# Patient Record
Sex: Male | Born: 1965 | Race: Black or African American | Hispanic: No | Marital: Married | Smoking: Never smoker
Health system: Southern US, Community
[De-identification: ages and names within clinical notes are randomized; demographics above are authoritative.]

## PROBLEM LIST (undated history)

## (undated) DIAGNOSIS — E785 Hyperlipidemia, unspecified: Secondary | ICD-10-CM

## (undated) DIAGNOSIS — I499 Cardiac arrhythmia, unspecified: Secondary | ICD-10-CM

## (undated) DIAGNOSIS — G4730 Sleep apnea, unspecified: Secondary | ICD-10-CM

## (undated) DIAGNOSIS — M109 Gout, unspecified: Secondary | ICD-10-CM

## (undated) DIAGNOSIS — I4891 Unspecified atrial fibrillation: Secondary | ICD-10-CM

## (undated) DIAGNOSIS — I10 Essential (primary) hypertension: Secondary | ICD-10-CM

## (undated) DIAGNOSIS — N400 Enlarged prostate without lower urinary tract symptoms: Secondary | ICD-10-CM

## (undated) DIAGNOSIS — I429 Cardiomyopathy, unspecified: Secondary | ICD-10-CM

## (undated) DIAGNOSIS — R011 Cardiac murmur, unspecified: Secondary | ICD-10-CM

## (undated) HISTORY — PX: COLONOSCOPY: SHX174

## (undated) HISTORY — DX: Sleep apnea, unspecified: G47.30

## (undated) HISTORY — DX: Cardiac murmur, unspecified: R01.1

## (undated) HISTORY — DX: Hyperlipidemia, unspecified: E78.5

---

## 2005-11-07 ENCOUNTER — Emergency Department (HOSPITAL_COMMUNITY): Admission: EM | Admit: 2005-11-07 | Discharge: 2005-11-08 | Payer: Self-pay | Admitting: Emergency Medicine

## 2005-11-10 ENCOUNTER — Emergency Department (HOSPITAL_COMMUNITY): Admission: EM | Admit: 2005-11-10 | Discharge: 2005-11-10 | Payer: Self-pay | Admitting: Family Medicine

## 2005-11-13 ENCOUNTER — Emergency Department (HOSPITAL_COMMUNITY): Admission: EM | Admit: 2005-11-13 | Discharge: 2005-11-13 | Payer: Self-pay | Admitting: Family Medicine

## 2006-06-26 ENCOUNTER — Encounter: Admission: RE | Admit: 2006-06-26 | Discharge: 2006-06-26 | Payer: Self-pay | Admitting: Family Medicine

## 2007-12-09 ENCOUNTER — Emergency Department (HOSPITAL_COMMUNITY): Admission: EM | Admit: 2007-12-09 | Discharge: 2007-12-09 | Payer: Self-pay | Admitting: Emergency Medicine

## 2009-12-01 ENCOUNTER — Emergency Department (HOSPITAL_COMMUNITY): Admission: EM | Admit: 2009-12-01 | Discharge: 2009-12-01 | Payer: Self-pay | Admitting: Emergency Medicine

## 2010-08-12 ENCOUNTER — Inpatient Hospital Stay (HOSPITAL_COMMUNITY)
Admission: EM | Admit: 2010-08-12 | Discharge: 2010-08-14 | Disposition: A | Discharge status: Still In Hospital | Payer: Self-pay | Source: Home / Self Care

## 2010-09-12 NOTE — H&P (Signed)
NAMEJET, ARMBRUST                 ACCOUNT NO.:  [id number]  MEDICAL RECORD NO.:  [id number]          PATIENT TYPE:  EMS  LOCATION:  [facility]                         FACILITY:  [facility]  PHYSICIAN:  Massie Maroon, MD        DATE OF BIRTH:  04-17-1966  DATE OF ADMISSION:  08/12/2010 DATE OF DISCHARGE:                             HISTORY & PHYSICAL   CHIEF COMPLAINT:  I have got a stomachache.  HISTORY OF PRESENT ILLNESS:  A 45 year old male with a history of hypertension, gout presents with complaints of stomachache, nausea, vomiting, diarrhea.  He apparently has had this in approximately every Thursdays for the last few months and complains that this is related to his allopurinol.  He presented today with similar complaint and was found to be in possible mild acute renal insufficiency.  CT scan showed severe thickening of the walls of multiple loops of small bowel that is seen in the jejunum consistent with an infectious or inflammatory enteritis rather than ischemia.  Fatty infiltration of the liver.  CT scan shows small bowel obstruction.  Patient will be admitted for abdominal pain, nausea, vomiting, diarrhea, and enteritis/small-bowel obstruction.  PAST MEDICAL HISTORY: 1. Hypertension. 2. Gout.  PAST SURGICAL HISTORY:  None.  SOCIAL HISTORY:  The patient does not smoke, but he does drink occasionally.  He likes to drink liquor.  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  He has service connected to 50% for hypertension.  FAMILY HISTORY:  Positive for hypertension.  ALLERGIES:  No known drug allergies.  MEDICATIONS:  Allopurinol 100 mg p.o. daily, hydrochlorothiazide 25 mg p.o. daily, terazosin 5 mg p.o. daily.  PHYSICAL EXAMINATION:  VITAL SIGNS: Temperature 97.4, pulse 114, repeat 97, blood pressure is 116/84, pulse ox 97% on room air.  HEENT: Anicteric, EOMI, no nystagmus, pupils 1.5 mm, symmetric, direct consensual reflexes intact.  Mucous  membranes moist.  NECK: No JVD, no bruit, no thyromegaly, no adenopathy.  HEART: Regular rate and rhythm. S1, S2.  No murmurs, gallops or rubs.  LUNGS: Clear to auscultation bilaterally.  ABDOMEN: Soft, slightly distended, nontender, positive bowel sounds.  No hepatosplenomegaly.  EXTREMITIES: No cyanosis, clubbing, or edema.  SKIN: No rashes.  LYMPH NODES: No adenopathy. NEURO: Nonfocal.  LABORATORY DATA:  WBC is 15.4, hemoglobin 18.4, platelet count 239. Sodium 136, potassium 3.3, BUN 15, creatinine 2.14, lactic acid 1.8, AST 23, ALT 20, alk phos 51, total bilirubin 1.2, lipase 24.  CT of the abdomen and pelvis shows severe thickening of multiple loops of small bowel that is seen in jejunum consistent with infectious or inflammatory arthritis rather than ischemia associated with small amount of scattered small ascites noted.  There is small bowel obstruction. Fatty infiltration of liver.  ASSESSMENT/PLAN:  Nausea, vomiting, diarrhea:  Secondary to small-bowel obstruction secondary to enteritis:  The patient will have stool studies for fecal leukocytes, culture C diff, ova and parasites.  We will check a TSH to rule out this as a cause of his diarrhea.  We will consult surgery for small-bowel obstruction.  The patient remained n.p.o. for  now and will observe with an abdominal flat and upright in the a.m.  The patient can have Zofran or Phenergan for his nausea and vomiting.  We will check a set of cardiac markers in case his nausea and vomiting is of cardiac etiology.  We will stop his allopurinol due to renal insufficiency and patient can be started on Uloric 40 mg p.o. daily.  He may be better served by using a different type of blood pressure medicine other than hydrochlorothiazide in light of his history of gout. We will switch him over to our carvedilol 6.25 mg p.o. b.i.d. and discontinue hydrochlorothiazide.  For DVT prophylaxis, will use Lovenox 40 mg subcu  daily.     Massie Maroon, MD     JYK/MEDQ  D:  08/13/2010  T:  08/13/2010  Job:  [id number]  Electronically Signed by Pearson Grippe MD on 09/12/2010 08:39:03 PM

## 2010-10-27 LAB — DIFFERENTIAL
Basophils Absolute: 0 10*3/uL (ref 0.0–0.1)
Basophils Absolute: 0 10*3/uL (ref 0.0–0.1)
Basophils Relative: 0 % (ref 0–1)
Basophils Relative: 0 % (ref 0–1)
Eosinophils Absolute: 0.1 10*3/uL (ref 0.0–0.7)
Eosinophils Relative: 1 % (ref 0–5)
Lymphocytes Relative: 35 % (ref 12–46)
Monocytes Absolute: 0.6 10*3/uL (ref 0.1–1.0)
Monocytes Absolute: 0.7 10*3/uL (ref 0.1–1.0)
Monocytes Relative: 4 % (ref 3–12)
Neutrophils Relative %: 82 % — ABNORMAL HIGH (ref 43–77)
Neutrophils Relative %: 86 % — ABNORMAL HIGH (ref 43–77)

## 2010-10-27 LAB — TROPONIN I: Troponin I: 0.04 ng/mL (ref 0.00–0.06)

## 2010-10-27 LAB — STOOL CULTURE

## 2010-10-27 LAB — HEPATIC FUNCTION PANEL
ALT: 20 U/L (ref 0–53)
Alkaline Phosphatase: 51 U/L (ref 39–117)
Total Protein: 6.7 g/dL (ref 6.0–8.3)

## 2010-10-27 LAB — LACTIC ACID, PLASMA: Lactic Acid, Venous: 1.8 mmol/L (ref 0.5–2.2)

## 2010-10-27 LAB — CBC
HCT: 42.2 % (ref 39.0–52.0)
HCT: 52.6 % — ABNORMAL HIGH (ref 39.0–52.0)
MCH: 30.5 pg (ref 26.0–34.0)
MCH: 30.7 pg (ref 26.0–34.0)
MCH: 31 pg (ref 26.0–34.0)
MCHC: 33.6 g/dL (ref 30.0–36.0)
MCV: 90.8 fL (ref 78.0–100.0)
Platelets: 187 10*3/uL (ref 150–400)
Platelets: 225 10*3/uL (ref 150–400)
Platelets: 239 10*3/uL (ref 150–400)
RDW: 13.9 % (ref 11.5–15.5)
RDW: 14.2 % (ref 11.5–15.5)
WBC: 15.4 10*3/uL — ABNORMAL HIGH (ref 4.0–10.5)
WBC: 6.7 10*3/uL (ref 4.0–10.5)

## 2010-10-27 LAB — BASIC METABOLIC PANEL
BUN: 16 mg/dL (ref 6–23)
CO2: 25 mEq/L (ref 19–32)
Calcium: 9.9 mg/dL (ref 8.4–10.5)
Chloride: 102 mEq/L (ref 96–112)
Creatinine, Ser: 1.71 mg/dL — ABNORMAL HIGH (ref 0.4–1.5)
GFR calc non Af Amer: 33 mL/min — ABNORMAL LOW (ref >60)
Glucose, Bld: 149 mg/dL — ABNORMAL HIGH (ref 70–99)
Glucose, Bld: 85 mg/dL (ref 70–99)
Sodium: 136 mEq/L (ref 135–145)

## 2010-10-27 LAB — COMPREHENSIVE METABOLIC PANEL
ALT: 19 U/L (ref 0–53)
Alkaline Phosphatase: 49 U/L (ref 39–117)
BUN: 16 mg/dL (ref 6–23)
GFR calc Af Amer: 45 mL/min — ABNORMAL LOW (ref >60)
GFR calc non Af Amer: 37 mL/min — ABNORMAL LOW (ref >60)
Glucose, Bld: 126 mg/dL — ABNORMAL HIGH (ref 70–99)
Potassium: 3.8 mEq/L (ref 3.5–5.1)
Sodium: 137 mEq/L (ref 135–145)

## 2010-10-27 LAB — GIARDIA/CRYPTOSPORIDIUM SCREEN(EIA)

## 2010-10-27 LAB — RAPID URINE DRUG SCREEN, HOSP PERFORMED
Amphetamines: NOT DETECTED
Cocaine: NOT DETECTED
Tetrahydrocannabinol: NOT DETECTED

## 2010-10-27 LAB — CK TOTAL AND CKMB (NOT AT ARMC)
CK, MB: 3.8 ng/mL (ref 0.3–4.0)
Relative Index: 2.1 (ref 0.0–2.5)
Total CK: 178 U/L (ref 7–232)

## 2010-10-27 LAB — FECAL LACTOFERRIN, QUANT

## 2010-10-27 LAB — MAGNESIUM: Magnesium: 1.8 mg/dL (ref 1.5–2.5)

## 2011-08-18 HISTORY — PX: CARDIAC DEFIBRILLATOR PLACEMENT: SHX171

## 2011-12-04 ENCOUNTER — Encounter (HOSPITAL_COMMUNITY): Payer: Self-pay | Admitting: Emergency Medicine

## 2011-12-04 ENCOUNTER — Emergency Department (HOSPITAL_COMMUNITY)
Admission: EM | Admit: 2011-12-04 | Discharge: 2011-12-04 | Disposition: A | Discharge status: Home / Self Care | Payer: Non-veteran care | Attending: Emergency Medicine | Admitting: Emergency Medicine

## 2011-12-04 DIAGNOSIS — I10 Essential (primary) hypertension: Secondary | ICD-10-CM | POA: Insufficient documentation

## 2011-12-04 DIAGNOSIS — Z79899 Other long term (current) drug therapy: Secondary | ICD-10-CM | POA: Insufficient documentation

## 2011-12-04 DIAGNOSIS — S51809A Unspecified open wound of unspecified forearm, initial encounter: Secondary | ICD-10-CM | POA: Insufficient documentation

## 2011-12-04 DIAGNOSIS — S51811A Laceration without foreign body of right forearm, initial encounter: Secondary | ICD-10-CM

## 2011-12-04 DIAGNOSIS — M79609 Pain in unspecified limb: Secondary | ICD-10-CM | POA: Insufficient documentation

## 2011-12-04 HISTORY — DX: Essential (primary) hypertension: I10

## 2011-12-04 MED ORDER — TETANUS-DIPHTH-ACELL PERTUSSIS 5-2.5-18.5 LF-MCG/0.5 IM SUSP
0.5000 mL | Freq: Once | INTRAMUSCULAR | Status: AC
  Administered 2011-12-04: 0.5 mL via INTRAMUSCULAR
  Filled 2011-12-04: qty 0.5

## 2011-12-04 MED ORDER — IBUPROFEN 800 MG PO TABS: 800.0000 mg | ORAL_TABLET | Freq: Three times a day (TID) | ORAL | Status: AC | PRN

## 2011-12-04 NOTE — Discharge Instructions (Signed)
Have the sutures out in 10 days. Return here as needed for any worsening in your condition. Keep wound clean and dry.

## 2011-12-04 NOTE — ED Notes (Signed)
Dressing applied to laceration after sutures were completed. Pt. Tolerated without difficulty.  Sterile dressing applied with non-adherant pad with kerlix
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
## 2011-12-04 NOTE — ED Provider Notes (Signed)
Medical screening examination/treatment/procedure(s) were performed by non-physician practitioner and as supervising physician I was immediately available for consultation/collaboration.  Gerhard Munch, MD 12/04/11 [phone]
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
## 2011-12-15 ENCOUNTER — Encounter (HOSPITAL_COMMUNITY): Payer: Self-pay | Admitting: Emergency Medicine

## 2011-12-15 ENCOUNTER — Emergency Department (INDEPENDENT_AMBULATORY_CARE_PROVIDER_SITE_OTHER)
Admission: EM | Admit: 2011-12-15 | Discharge: 2011-12-15 | Disposition: A | Discharge status: Home Health | Payer: Non-veteran care | Source: Home / Self Care | Attending: Family Medicine | Admitting: Family Medicine

## 2011-12-15 DIAGNOSIS — Z4802 Encounter for removal of sutures: Secondary | ICD-10-CM

## 2011-12-15 DIAGNOSIS — IMO0002 Reserved for concepts with insufficient information to code with codable children: Secondary | ICD-10-CM

## 2011-12-15 NOTE — ED Notes (Signed)
Here for suture removal to right lat forearm s/p laceration 12/04/11.incision well approx,intact without drainage but minimal tenderness.17 stitches intact.denies pain or numb/tingling

## 2011-12-15 NOTE — Discharge Instructions (Signed)
Follow handout instructions for wound care. Return if redness swelling, pain or drainage.

## 2011-12-17 NOTE — ED Provider Notes (Signed)
History     CSN: [id number]  Arrival date & time 12/15/11  1619   First MD Initiated Contact with Patient 12/15/11 1721      Chief Complaint  Patient presents with  . Suture / Staple Removal    (Consider location/radiation/quality/duration/timing/severity/associated sxs/prior treatment) Patient is a 46 y.o. male presenting with suture removal. The history is provided by the patient. No language interpreter was used.  Suture / Staple Removal  The sutures were placed 7 to 10 days ago. Treatments since wound repair include regular soap and water washings. Fever duration: no fever. There has been no drainage from the wound. There is no redness present. There is no swelling present. The pain has improved. He has no difficulty moving the affected extremity or digit.    Past Medical History  Diagnosis Date  . Hypertension     History reviewed. No pertinent past surgical history.  No family history on file.  History  Substance Use Topics  . Smoking status: Never Smoker   . Smokeless tobacco: Not on file  . Alcohol Use: No      Review of Systems  Allergies  Penicillins  Home Medications   Current Outpatient Rx  Name Route Sig Dispense Refill  . AMLODIPINE BESYLATE 10 MG PO TABS Oral Take 10 mg by mouth daily.    Marland Kitchen CARVEDILOL 25 MG PO TABS Oral Take 25 mg by mouth 2 (two) times daily with a meal.    . VITAMIN D 1000 UNITS PO TABS Oral Take 1,000 Units by mouth daily.    Marland Kitchen LISINOPRIL 40 MG PO TABS Oral Take 40 mg by mouth daily.    Marland Kitchen MAGNESIUM OXIDE 400 MG PO TABS Oral Take 800 mg by mouth daily.    Marland Kitchen TERAZOSIN HCL 5 MG PO CAPS Oral Take 5 mg by mouth at bedtime.      BP 148/102  Pulse 79  Temp(Src) 98.7 F (37.1 C) (Oral)  Resp 16  SpO2 99%  Physical Exam  Nursing note and vitals reviewed. Constitutional: He is oriented to person, place, and time. He appears well-developed and well-nourished. No distress.  Cardiovascular: Normal heart sounds.   Pulmonary/Chest:  Breath sounds normal.  Musculoskeletal:       Right fore arm: there is transverse laceration >10cm s/p repair with 17 interrupted sutures located in dorsal lateral proximal forearm. Fore arm with normal supination and pronation, flexion and extension. Intact sensation and strenght. Right wrist and hand: FROM, neurovascularly intact.  Neurological: He is alert and oriented to person, place, and time.    ED Course  Procedures (including critical care time)  Labs Reviewed - No data to display No results found.   1. Dressing change/suture removal       MDM  Sutures removed. No drainage or wound dehiscence. Borders well approximated except in the center with a superficial separation of few millimeters. No redness, fluctuation, tenderness, swelling or drainage. sterile strip applied.dry dressing placed on top. Wound care instructions provided asked to return if redness, swelling or drainage.        Sharin Grave, MD 12/17/11 [phone]

## 2012-12-10 ENCOUNTER — Emergency Department (HOSPITAL_COMMUNITY)
Admission: EM | Admit: 2012-12-10 | Discharge: 2012-12-10 | Disposition: A | Discharge status: Home / Self Care | Payer: Non-veteran care | Attending: Emergency Medicine | Admitting: Emergency Medicine

## 2012-12-10 ENCOUNTER — Encounter (HOSPITAL_COMMUNITY): Payer: Self-pay

## 2012-12-10 DIAGNOSIS — Z8679 Personal history of other diseases of the circulatory system: Secondary | ICD-10-CM | POA: Insufficient documentation

## 2012-12-10 DIAGNOSIS — I10 Essential (primary) hypertension: Secondary | ICD-10-CM | POA: Insufficient documentation

## 2012-12-10 DIAGNOSIS — M109 Gout, unspecified: Secondary | ICD-10-CM | POA: Insufficient documentation

## 2012-12-10 DIAGNOSIS — Z87448 Personal history of other diseases of urinary system: Secondary | ICD-10-CM | POA: Insufficient documentation

## 2012-12-10 DIAGNOSIS — Z880 Allergy status to penicillin: Secondary | ICD-10-CM | POA: Insufficient documentation

## 2012-12-10 DIAGNOSIS — Z79899 Other long term (current) drug therapy: Secondary | ICD-10-CM | POA: Insufficient documentation

## 2012-12-10 DIAGNOSIS — R609 Edema, unspecified: Secondary | ICD-10-CM | POA: Insufficient documentation

## 2012-12-10 HISTORY — DX: Unspecified atrial fibrillation: I48.91

## 2012-12-10 HISTORY — DX: Gout, unspecified: M10.9

## 2012-12-10 HISTORY — DX: Benign prostatic hyperplasia without lower urinary tract symptoms: N40.0

## 2012-12-10 MED ORDER — OXYCODONE-ACETAMINOPHEN 5-325 MG PO TABS: 1.0000 | ORAL_TABLET | Freq: Four times a day (QID) | ORAL | Status: DC | PRN

## 2012-12-10 MED ORDER — PREDNISONE 20 MG PO TABS
60.0000 mg | ORAL_TABLET | Freq: Once | ORAL | Status: AC
  Administered 2012-12-10: 60 mg via ORAL
  Filled 2012-12-10: qty 3

## 2012-12-10 MED ORDER — PREDNISONE 20 MG PO TABS: 60.0000 mg | ORAL_TABLET | Freq: Every day | ORAL | Status: DC

## 2012-12-10 NOTE — ED Notes (Signed)
Dr. Sheldon at the bedside.  

## 2012-12-10 NOTE — ED Notes (Addendum)
Thinks he is having a gout flare up in his left foot to his great toe after taking diltiazem 30 days ago. Normally doesn't have any main issues with it.

## 2012-12-10 NOTE — ED Provider Notes (Signed)
History     CSN: [id number]  Arrival date & time 12/10/12  0719   First MD Initiated Contact with Patient 12/10/12 [phone]      Chief Complaint  Patient presents with  . Gout    (Consider location/radiation/quality/duration/timing/severity/associated sxs/prior treatment) HPI Pt with remote history of gout has had worsening pain and swelling in left great toe for the last 3 days, now severe, unable to sleep due to pain. Worse with movement and walking. Was previously told his gout was due to HCTZ so he is no longer taking that. He was started on Cardizem a month ago for rate control of his a-fib. Has been doing well with that.   Past Medical History  Diagnosis Date  . Hypertension   . Gout   . A-fib   . Enlarged prostate     Past Surgical History  Procedure Laterality Date  . Cardiac defibrillator placement  2013    History reviewed. No pertinent family history.  History  Substance Use Topics  . Smoking status: Never Smoker   . Smokeless tobacco: Not on file  . Alcohol Use: No      Review of Systems All other systems reviewed and are negative except as noted in HPI.   Allergies  Penicillins  Home Medications   Current Outpatient Rx  Name  Route  Sig  Dispense  Refill  . amLODipine (NORVASC) 10 MG tablet   Oral   Take 10 mg by mouth daily.         . carvedilol (COREG) 25 MG tablet   Oral   Take 25 mg by mouth 2 (two) times daily with a meal.         . cholecalciferol (VITAMIN D) 1000 UNITS tablet   Oral   Take 1,000 Units by mouth daily.         Marland Kitchen diltiazem (DILACOR XR) 120 MG 24 hr capsule   Oral   Take 120 mg by mouth daily.         Marland Kitchen lisinopril (PRINIVIL,ZESTRIL) 40 MG tablet   Oral   Take 40 mg by mouth daily.         Marland Kitchen terazosin (HYTRIN) 5 MG capsule   Oral   Take 5 mg by mouth at bedtime.           BP 157/106  Pulse 74  Temp(Src) 97.9 F (36.6 C) (Oral)  Resp 20  Ht 6' (1.829 m)  Wt 225 lb (102.059 kg)  BMI 30.51 kg/m2   SpO2 97%  Physical Exam  Nursing note and vitals reviewed. Constitutional: He is oriented to person, place, and time. He appears well-developed and well-nourished.  HENT:  Head: Normocephalic and atraumatic.  Eyes: EOM are normal. Pupils are equal, round, and reactive to light.  Neck: Normal range of motion. Neck supple.  Cardiovascular: Normal rate, normal heart sounds and intact distal pulses.   Pulmonary/Chest: Effort normal and breath sounds normal.  Abdominal: Bowel sounds are normal. He exhibits no distension. There is no tenderness.  Musculoskeletal: Normal range of motion. He exhibits edema (erythema, warmth and tenderness to L 1st MTP join) and tenderness.  Neurological: He is alert and oriented to person, place, and time. He has normal strength. No cranial nerve deficit or sensory deficit.  Skin: Skin is warm and dry. No rash noted.  Psychiatric: He has a normal mood and affect.    ED Course  Procedures (including critical care time)  Labs Reviewed - No data to display  No results found.   1. Gout of big toe       MDM  Exam and history consistent with gout. Start Prednisone and Percocet. Advised followup at the [facility] for any persistent symptoms and for prophylaxis if symptoms become more frequent.         Raidyn Breiner B. Bernette Mayers, MD 12/10/12 [phone]

## 2014-05-22 ENCOUNTER — Emergency Department (INDEPENDENT_AMBULATORY_CARE_PROVIDER_SITE_OTHER)
Admission: EM | Admit: 2014-05-22 | Discharge: 2014-05-22 | Disposition: A | Discharge status: Home / Self Care | Payer: Non-veteran care | Source: Home / Self Care | Attending: Family Medicine | Admitting: Family Medicine

## 2014-05-22 ENCOUNTER — Encounter (HOSPITAL_COMMUNITY): Payer: Self-pay | Admitting: Emergency Medicine

## 2014-05-22 DIAGNOSIS — T783XXA Angioneurotic edema, initial encounter: Secondary | ICD-10-CM

## 2014-05-22 DIAGNOSIS — T464X5A Adverse effect of angiotensin-converting-enzyme inhibitors, initial encounter: Secondary | ICD-10-CM

## 2014-05-22 NOTE — ED Provider Notes (Signed)
CSN: [id number]     Arrival date & time 05/22/14  1037 History   First MD Initiated Contact with Patient 05/22/14 1041     No chief complaint on file.  (Consider location/radiation/quality/duration/timing/severity/associated sxs/prior Treatment) Patient is a 48 y.o. male presenting with rash.  Rash Location:  Mouth Mouth rash location:  Upper outer lip Quality: swelling   Severity:  Mild Onset quality:  Sudden Chronicity:  New Context: medications   Associated symptoms: no periorbital edema, no shortness of breath, no throat swelling, no tongue swelling and not wheezing     Past Medical History  Diagnosis Date  . Hypertension   . Gout   . A-fib   . Enlarged prostate    Past Surgical History  Procedure Laterality Date  . Cardiac defibrillator placement  2013   History reviewed. No pertinent family history. History  Substance Use Topics  . Smoking status: Never Smoker   . Smokeless tobacco: Not on file  . Alcohol Use: Yes     Comment: weekend use    Review of Systems  Constitutional: Negative.   Respiratory: Negative for shortness of breath and wheezing.   Skin: Positive for rash.  All other systems reviewed and are negative.   Allergies  Penicillins  Home Medications   Prior to Admission medications   Medication Sig Start Date End Date Taking? Authorizing Provider  aspirin 81 MG chewable tablet Chew 81 mg by mouth daily.   Yes Historical Provider, MD  atorvastatin (LIPITOR) 80 MG tablet Take 80 mg by mouth daily.   Yes Historical Provider, MD  carvedilol (COREG) 25 MG tablet Take 25 mg by mouth 2 (two) times daily with a meal.   Yes Historical Provider, MD  clotrimazole (LOTRIMIN) 1 % cream Apply 1 application topically 2 (two) times daily.   Yes Historical Provider, MD  diltiazem (DILACOR XR) 120 MG 24 hr capsule Take 120 mg by mouth daily.   Yes Historical Provider, MD  disopyramide (NORPACE CR) 100 MG 12 hr capsule Take by mouth 2 (two) times daily.   Yes  Historical Provider, MD  lisinopril (PRINIVIL,ZESTRIL) 40 MG tablet Take 40 mg by mouth daily.   Yes Historical Provider, MD  loperamide (IMODIUM) 2 MG capsule Take 2 mg by mouth as needed for diarrhea or loose stools (q4 hours as needed for diarrhea).   Yes Historical Provider, MD  magnesium oxide (MAG-OX) 400 MG tablet Take 400 mg by mouth daily.   Yes Historical Provider, MD  terazosin (HYTRIN) 5 MG capsule Take 10 mg by mouth at bedtime.    Yes Historical Provider, MD  amLODipine (NORVASC) 10 MG tablet Take 10 mg by mouth daily.    Historical Provider, MD  cholecalciferol (VITAMIN D) 1000 UNITS tablet Take 1,000 Units by mouth daily.    Historical Provider, MD  oxyCODONE-acetaminophen (PERCOCET/ROXICET) 5-325 MG per tablet Take 1-2 tablets by mouth every 6 (six) hours as needed for pain. 12/10/12   Charles B. Bernette MayersSheldon, MD  predniSONE (DELTASONE) 20 MG tablet Take 3 tablets (60 mg total) by mouth daily. 12/10/12   Charles B. Bernette MayersSheldon, MD   BP 153/116  Pulse 77  Temp(Src) 98.5 F (36.9 C) (Oral)  Resp 20  Ht 6' (1.829 m)  Wt 236 lb (107.049 kg)  BMI 32.00 kg/m2 Physical Exam  Nursing note and vitals reviewed. Constitutional: He is oriented to person, place, and time. He appears well-developed and well-nourished.  HENT:  Head: Macrocephalic: edema of upper lip,,   Mouth/Throat: Oropharynx is  clear and moist.  Eyes: Conjunctivae are normal. Pupils are equal, round, and reactive to light.  Neck: Normal range of motion. Neck supple.  Cardiovascular: Normal heart sounds and intact distal pulses.   Pulmonary/Chest: Effort normal and breath sounds normal.  Lymphadenopathy:    He has no cervical adenopathy.  Neurological: He is alert and oriented to person, place, and time.  Skin: Skin is warm and dry.    ED Course  Procedures (including critical care time) Labs Review Labs Reviewed - No data to display  Imaging Review No results found.   MDM   1. ACE inhibitor-aggravated  angioedema, initial encounter       Linna Hoff, MD 05/22/14 1125

## 2014-08-24 ENCOUNTER — Emergency Department (HOSPITAL_COMMUNITY): Payer: Non-veteran care

## 2014-08-24 ENCOUNTER — Encounter (HOSPITAL_COMMUNITY): Payer: Self-pay | Admitting: Emergency Medicine

## 2014-08-24 ENCOUNTER — Emergency Department (HOSPITAL_COMMUNITY)
Admission: EM | Admit: 2014-08-24 | Discharge: 2014-08-24 | Disposition: A | Discharge status: Home / Self Care | Payer: Non-veteran care | Attending: Emergency Medicine | Admitting: Emergency Medicine

## 2014-08-24 DIAGNOSIS — Z95810 Presence of automatic (implantable) cardiac defibrillator: Secondary | ICD-10-CM | POA: Diagnosis not present

## 2014-08-24 DIAGNOSIS — R05 Cough: Secondary | ICD-10-CM

## 2014-08-24 DIAGNOSIS — Z79899 Other long term (current) drug therapy: Secondary | ICD-10-CM | POA: Diagnosis not present

## 2014-08-24 DIAGNOSIS — J40 Bronchitis, not specified as acute or chronic: Secondary | ICD-10-CM | POA: Diagnosis not present

## 2014-08-24 DIAGNOSIS — Z87438 Personal history of other diseases of male genital organs: Secondary | ICD-10-CM | POA: Insufficient documentation

## 2014-08-24 DIAGNOSIS — Z7982 Long term (current) use of aspirin: Secondary | ICD-10-CM | POA: Diagnosis not present

## 2014-08-24 DIAGNOSIS — I4891 Unspecified atrial fibrillation: Secondary | ICD-10-CM | POA: Insufficient documentation

## 2014-08-24 DIAGNOSIS — Z880 Allergy status to penicillin: Secondary | ICD-10-CM | POA: Diagnosis not present

## 2014-08-24 DIAGNOSIS — Z8739 Personal history of other diseases of the musculoskeletal system and connective tissue: Secondary | ICD-10-CM | POA: Insufficient documentation

## 2014-08-24 DIAGNOSIS — I10 Essential (primary) hypertension: Secondary | ICD-10-CM | POA: Diagnosis not present

## 2014-08-24 DIAGNOSIS — R059 Cough, unspecified: Secondary | ICD-10-CM

## 2014-08-24 MED ORDER — AZITHROMYCIN 250 MG PO TABS: 250.0000 mg | ORAL_TABLET | Freq: Every day | ORAL | Status: DC

## 2014-08-24 MED ORDER — PREDNISONE 10 MG PO TABS: 40.0000 mg | ORAL_TABLET | Freq: Every day | ORAL | Status: DC

## 2014-08-24 MED ORDER — HYDROCOD POLST-CHLORPHEN POLST 10-8 MG/5ML PO LQCR: 5.0000 mL | Freq: Two times a day (BID) | ORAL | Status: DC

## 2014-08-24 MED ORDER — ALBUTEROL SULFATE HFA 108 (90 BASE) MCG/ACT IN AERS: 1.0000 | INHALATION_SPRAY | Freq: Four times a day (QID) | RESPIRATORY_TRACT | Status: DC | PRN

## 2014-08-24 MED ORDER — CETIRIZINE HCL 10 MG PO CAPS: 10.0000 mg | ORAL_CAPSULE | Freq: Every day | ORAL | Status: DC

## 2014-08-24 NOTE — ED Provider Notes (Signed)
CSN: [id number]     Arrival date & time 08/24/14  2114 History  This chart was scribed for Eben Burow, PA-C, working with [employer]. Rubin Payor, MD by Elon Spanner, ED Scribe. This patient was seen in room TR05C/TR05C and the patient's care was started at 10:03 PM.   Chief Complaint  Patient presents with  . Cough   The history is provided by the patient. No language interpreter was used.   HPI Comments: HAMDI KLEY is a 49 y.o. male who presents to the Emergency Department complaining of a cough sporadically productive of thin mucous with associated intermittent chills, fever, SOB, and wheezing onset 1 month ago.   He reports the cough is worsening with laying down.  He reports difficulty sleeping and muscle soreness due to couging.  Patient has taken Mucinex, Robitussin and Delsum. Patient reports a history of HTN but denies DM, other medical conditions.  Patient denies hospitalization within 3 months.  Patient denies ear pain, CP.  Past Medical History  Diagnosis Date  . Hypertension   . Gout   . A-fib   . Enlarged prostate    Past Surgical History  Procedure Laterality Date  . Cardiac defibrillator placement  2013  . Pacemaker insertion    . Cardiac defibrillator placement     No family history on file. History  Substance Use Topics  . Smoking status: Never Smoker   . Smokeless tobacco: Not on file  . Alcohol Use: Yes     Comment: weekend use    Review of Systems  Constitutional: Positive for fever and chills.  Respiratory: Positive for cough, shortness of breath and wheezing.   All other systems reviewed and are negative.     Allergies  Penicillins  Home Medications   Prior to Admission medications   Medication Sig Start Date End Date Taking? Authorizing Provider  albuterol (PROVENTIL HFA;VENTOLIN HFA) 108 (90 BASE) MCG/ACT inhaler Inhale 1-2 puffs into the lungs every 6 (six) hours as needed for wheezing or shortness of breath. 08/24/14   Naven Giambalvo A  Forcucci, PA-C  amLODipine (NORVASC) 10 MG tablet Take 10 mg by mouth daily.    Historical Provider, MD  aspirin 81 MG chewable tablet Chew 81 mg by mouth daily.    Historical Provider, MD  atorvastatin (LIPITOR) 80 MG tablet Take 80 mg by mouth daily.    Historical Provider, MD  azithromycin (ZITHROMAX) 250 MG tablet Take 1 tablet (250 mg total) by mouth daily. Take first 2 tablets together, then 1 every day until finished. 08/24/14   Awais Cobarrubias A Forcucci, PA-C  carvedilol (COREG) 25 MG tablet Take 25 mg by mouth 2 (two) times daily with a meal.    Historical Provider, MD  Cetirizine HCl (ZYRTEC ALLERGY) 10 MG CAPS Take 1 capsule (10 mg total) by mouth at bedtime. 08/24/14   Treyven Lafauci A Forcucci, PA-C  chlorpheniramine-HYDROcodone (TUSSIONEX PENNKINETIC ER) 10-8 MG/5ML LQCR Take 5 mLs by mouth 2 (two) times daily. 08/24/14   Kairos Panetta A Forcucci, PA-C  cholecalciferol (VITAMIN D) 1000 UNITS tablet Take 1,000 Units by mouth daily.    Historical Provider, MD  clotrimazole (LOTRIMIN) 1 % cream Apply 1 application topically 2 (two) times daily.    Historical Provider, MD  diltiazem (DILACOR XR) 120 MG 24 hr capsule Take 120 mg by mouth daily.    Historical Provider, MD  disopyramide (NORPACE CR) 100 MG 12 hr capsule Take by mouth 2 (two) times daily.    Historical Provider, MD  lisinopril (  PRINIVIL,ZESTRIL) 40 MG tablet Take 40 mg by mouth daily.    Historical Provider, MD  loperamide (IMODIUM) 2 MG capsule Take 2 mg by mouth as needed for diarrhea or loose stools (q4 hours as needed for diarrhea).    Historical Provider, MD  magnesium oxide (MAG-OX) 400 MG tablet Take 400 mg by mouth daily.    Historical Provider, MD  oxyCODONE-acetaminophen (PERCOCET/ROXICET) 5-325 MG per tablet Take 1-2 tablets by mouth every 6 (six) hours as needed for pain. 12/10/12   Charles B. Bernette MayersSheldon, MD  predniSONE (DELTASONE) 10 MG tablet Take 4 tablets (40 mg total) by mouth daily with breakfast. 08/24/14   Bertin Inabinet A Forcucci, PA-C   terazosin (HYTRIN) 5 MG capsule Take 10 mg by mouth at bedtime.     Historical Provider, MD   BP 121/86 mmHg  Pulse 79  Temp(Src) 98.3 F (36.8 C) (Oral)  Resp 14  Ht 6' (1.829 m)  Wt 230 lb (104.327 kg)  BMI 31.19 kg/m2  SpO2 99% Physical Exam  Constitutional: He is oriented to person, place, and time. He appears well-developed and well-nourished. No distress.  HENT:  Head: Normocephalic and atraumatic.  Nose: Mucosal edema present.  Mouth/Throat: Oropharynx is clear and moist. No oropharyngeal exudate.  Eyes: Conjunctivae and EOM are normal. Pupils are equal, round, and reactive to light. No scleral icterus.  Neck: Normal range of motion. Neck supple. No JVD present. No thyromegaly present.  Cardiovascular: Normal rate, regular rhythm, normal heart sounds and intact distal pulses.  Exam reveals no gallop and no friction rub.   No murmur heard. Pulmonary/Chest: No respiratory distress. He has no decreased breath sounds. He has no wheezes. He has no rhonchi. He has rales in the right lower field. He exhibits no tenderness.  Musculoskeletal: Normal range of motion.  Lymphadenopathy:    He has no cervical adenopathy.  Neurological: He is alert and oriented to person, place, and time.  Skin: Skin is warm and dry.  Psychiatric: He has a normal mood and affect. His behavior is normal. Judgment and thought content normal.  Nursing note and vitals reviewed.   ED Course  Procedures (including critical care time)  DIAGNOSTIC STUDIES: Oxygen Saturation is 99% on RA, normal by my interpretation.    COORDINATION OF CARE:  10:07 PM Will prescribe antibiotics, inhaler, cough syrup and steroid.  Patient advised to use saline spray or Neti pot.  Patient advised to follow-up with PCP in 1 week of no improvement observed.  Patient acknowledges and agrees with plan.     Labs Review Labs Reviewed - No data to display  Imaging Review Dg Chest 2 View  08/24/2014   CLINICAL DATA:  Cough,  congestion and shortness of breath for past month. Sinusitis.  EXAM: CHEST  2 VIEW  COMPARISON:  None.  FINDINGS: Cardiomediastinal silhouette is unremarkable. The lungs are clear without pleural effusions or focal consolidations. LEFT cardiac defibrillator, lead tips projecting RIGHT atrium and RIGHT ventricle. Trachea projects midline and there is no pneumothorax. Soft tissue planes and included osseous structures are non-suspicious. Mild degenerative change of the thoracic spine.  IMPRESSION: No acute cardiopulmonary process.  Cardiac defibrillator.   Electronically Signed   By: Awilda Metroourtnay  Bloomer   On: 08/24/2014 22:05     EKG Interpretation None      MDM   Final diagnoses:  Bronchitis   Patient is a 49 year old male who presents emergency room for evaluation of cough, congestion, and sore throat. Physical exam reveals some crackles in  the right lower lung fields. Patient reports wheezing. No wheezing heard today. We'll discharge home with azithromycin, Tussionex, prednisone, and Zyrtec. Patient follow-up with his PCP in 1 week if no improvement. Have recommended using nasal saline in his nose. Patient states understanding and agreement at this time. Patient to return for worsening shortness of breath, chest pain, or any other concerning symptoms.   I personally performed the services described in this documentation, which was scribed in my presence. The recorded information has been reviewed and is accurate.    Eben Burow, PA-C 08/24/14 2218  Juliet Rude. Rubin Payor, MD 08/25/14 1610

## 2014-08-24 NOTE — ED Notes (Signed)
Pt. reports persistent productive cough , nasal congestion / runny nose for 1 month . Denies fever or chills.

## 2014-08-24 NOTE — Discharge Instructions (Signed)

## 2014-10-09 ENCOUNTER — Other Ambulatory Visit (HOSPITAL_COMMUNITY)
Admission: RE | Admit: 2014-10-09 | Discharge: 2014-10-09 | Disposition: A | Discharge status: Home / Self Care | Payer: Self-pay | Source: Ambulatory Visit | Attending: Family Medicine | Admitting: Family Medicine

## 2014-10-09 ENCOUNTER — Encounter (HOSPITAL_COMMUNITY): Payer: Self-pay | Admitting: Emergency Medicine

## 2014-10-09 ENCOUNTER — Emergency Department (INDEPENDENT_AMBULATORY_CARE_PROVIDER_SITE_OTHER)
Admission: EM | Admit: 2014-10-09 | Discharge: 2014-10-09 | Disposition: A | Discharge status: Home / Self Care | Payer: Self-pay | Source: Home / Self Care | Attending: Family Medicine | Admitting: Family Medicine

## 2014-10-09 DIAGNOSIS — Z202 Contact with and (suspected) exposure to infections with a predominantly sexual mode of transmission: Secondary | ICD-10-CM

## 2014-10-09 DIAGNOSIS — R300 Dysuria: Secondary | ICD-10-CM

## 2014-10-09 DIAGNOSIS — Z113 Encounter for screening for infections with a predominantly sexual mode of transmission: Secondary | ICD-10-CM | POA: Insufficient documentation

## 2014-10-09 DIAGNOSIS — I10 Essential (primary) hypertension: Secondary | ICD-10-CM

## 2014-10-09 LAB — URINE MICROSCOPIC-ADD ON

## 2014-10-09 LAB — URINALYSIS, ROUTINE W REFLEX MICROSCOPIC
BILIRUBIN URINE: NEGATIVE
Glucose, UA: NEGATIVE mg/dL
Ketones, ur: NEGATIVE mg/dL
Leukocytes, UA: NEGATIVE
Nitrite: NEGATIVE
Protein, ur: NEGATIVE mg/dL
SPECIFIC GRAVITY, URINE: 1.016 (ref 1.005–1.030)
Urobilinogen, UA: 0.2 mg/dL (ref 0.0–1.0)
pH: 6.5 (ref 5.0–8.0)

## 2014-10-09 LAB — POCT URINALYSIS DIP (DEVICE)
Bilirubin Urine: NEGATIVE
Glucose, UA: NEGATIVE mg/dL
Ketones, ur: NEGATIVE mg/dL
LEUKOCYTES UA: NEGATIVE
NITRITE: NEGATIVE
PH: 7 (ref 5.0–8.0)
PROTEIN: 30 mg/dL — AB
Specific Gravity, Urine: 1.025 (ref 1.005–1.030)
Urobilinogen, UA: 0.2 mg/dL (ref 0.0–1.0)

## 2014-10-09 MED ORDER — PHENAZOPYRIDINE HCL 100 MG PO TABS: 100.0000 mg | ORAL_TABLET | Freq: Three times a day (TID) | ORAL | Status: DC | PRN

## 2014-10-09 MED ORDER — LIDOCAINE HCL (PF) 1 % IJ SOLN
INTRAMUSCULAR | Status: AC
  Filled 2014-10-09: qty 5

## 2014-10-09 MED ORDER — AZITHROMYCIN 250 MG PO TABS
ORAL_TABLET | ORAL | Status: AC
  Filled 2014-10-09: qty 4

## 2014-10-09 MED ORDER — CEFTRIAXONE SODIUM 250 MG IJ SOLR
250.0000 mg | Freq: Once | INTRAMUSCULAR | Status: AC
  Administered 2014-10-09: 250 mg via INTRAMUSCULAR

## 2014-10-09 MED ORDER — CEFTRIAXONE SODIUM 250 MG IJ SOLR
INTRAMUSCULAR | Status: AC
  Filled 2014-10-09: qty 250

## 2014-10-09 MED ORDER — AZITHROMYCIN 250 MG PO TABS
1000.0000 mg | ORAL_TABLET | Freq: Once | ORAL | Status: AC
  Administered 2014-10-09: 1000 mg via ORAL

## 2014-10-09 NOTE — ED Notes (Signed)
C/o  Urinary frequency and penile discharge noticed 2 days ago.  Denies fever, n/v/d.  No abdominal or pelvic pain.

## 2014-10-09 NOTE — Discharge Instructions (Signed)
He likely have an STD. This was treated in our clinic with 2 antibiotics. Please use the Pyridium moving forward for any burning with urination. This may turn her urine orange to red. We will call you if there is any further follow-up necessary based on your lab results.

## 2014-10-09 NOTE — ED Provider Notes (Signed)
CSN: [id number]     Arrival date & time 10/09/14  0945 History   First MD Initiated Contact with Patient 10/09/14 1202     Chief Complaint  Patient presents with  . Urinary Tract Infection  . Penile Discharge   (Consider location/radiation/quality/duration/timing/severity/associated sxs/prior Treatment) HPI  Penile discharge. Started 2 days ago. Skin pimple in groin area. Associated w/ frequency. Denies fevers, dysuria, groin adenopathy, general aches/malaise, penile lesions, abd pain, back pain. Sexually active w/ condoms "mostly." no known STD in sexual partner.   HTN: took medications this morning. Denies CP, SOB, palpitations.    Past Medical History  Diagnosis Date  . Hypertension   . Gout   . A-fib   . Enlarged prostate    Past Surgical History  Procedure Laterality Date  . Cardiac defibrillator placement  2013  . Pacemaker insertion    . Cardiac defibrillator placement     Family History  Problem Relation Age of Onset  . Diabetes Mother   . Hypertension Mother   . Hypertension Father    History  Substance Use Topics  . Smoking status: Never Smoker   . Smokeless tobacco: Not on file  . Alcohol Use: Yes     Comment: weekend use    Review of Systems Per HPI with all other pertinent systems negative.   Allergies  Penicillins  Home Medications   Prior to Admission medications   Medication Sig Start Date End Date Taking? Authorizing Provider  amLODipine (NORVASC) 10 MG tablet Take 10 mg by mouth daily.   Yes Historical Provider, MD  aspirin 81 MG chewable tablet Chew 81 mg by mouth daily.   Yes Historical Provider, MD  atorvastatin (LIPITOR) 80 MG tablet Take 80 mg by mouth daily.   Yes Historical Provider, MD  carvedilol (COREG) 25 MG tablet Take 25 mg by mouth 2 (two) times daily with a meal.   Yes Historical Provider, MD  diltiazem (DILACOR XR) 120 MG 24 hr capsule Take 120 mg by mouth daily.   Yes Historical Provider, MD  albuterol (PROVENTIL  HFA;VENTOLIN HFA) 108 (90 BASE) MCG/ACT inhaler Inhale 1-2 puffs into the lungs every 6 (six) hours as needed for wheezing or shortness of breath. 08/24/14   Courtney A Forcucci, PA-C  Cetirizine HCl (ZYRTEC ALLERGY) 10 MG CAPS Take 1 capsule (10 mg total) by mouth at bedtime. 08/24/14   Courtney A Forcucci, PA-C  cholecalciferol (VITAMIN D) 1000 UNITS tablet Take 1,000 Units by mouth daily.    Historical Provider, MD  clotrimazole (LOTRIMIN) 1 % cream Apply 1 application topically 2 (two) times daily.    Historical Provider, MD  disopyramide (NORPACE CR) 100 MG 12 hr capsule Take by mouth 2 (two) times daily.    Historical Provider, MD  lisinopril (PRINIVIL,ZESTRIL) 40 MG tablet Take 40 mg by mouth daily.    Historical Provider, MD  loperamide (IMODIUM) 2 MG capsule Take 2 mg by mouth as needed for diarrhea or loose stools (q4 hours as needed for diarrhea).    Historical Provider, MD  magnesium oxide (MAG-OX) 400 MG tablet Take 400 mg by mouth daily.    Historical Provider, MD  oxyCODONE-acetaminophen (PERCOCET/ROXICET) 5-325 MG per tablet Take 1-2 tablets by mouth every 6 (six) hours as needed for pain. 12/10/12   Charles B. Bernette MayersSheldon, MD  phenazopyridine (PYRIDIUM) 100 MG tablet Take 1-2 tablets (100-200 mg total) by mouth 3 (three) times daily as needed for pain. 10/09/14   Ozella Rocksavid J Devell Parkerson, MD  terazosin (HYTRIN) 5  MG capsule Take 10 mg by mouth at bedtime.     Historical Provider, MD   BP 153/113 mmHg  Pulse 79  Temp(Src) 98 F (36.7 C) (Oral)  Resp 16  SpO2 96% Physical Exam  Constitutional: He is oriented to person, place, and time. He appears well-developed and well-nourished.  HENT:  Head: Normocephalic and atraumatic.  Eyes: EOM are normal. Pupils are equal, round, and reactive to light.  Neck: Normal range of motion.  Cardiovascular: Normal rate and normal heart sounds.   No murmur heard. Pulmonary/Chest: Effort normal and breath sounds normal.  Abdominal: He exhibits no distension.   Genitourinary:   Penis, scrotum, testicles nml. No lesions.  No groin adenopathy No appreciable discharge.    Musculoskeletal: Normal range of motion.  Neurological: He is alert and oriented to person, place, and time.  Skin: Skin is warm. No rash noted. No erythema. No pallor.  Psychiatric: He has a normal mood and affect. His behavior is normal. Judgment and thought content normal.    ED Course  Procedures (including critical care time) Labs Review Labs Reviewed  POCT URINALYSIS DIP (DEVICE) - Abnormal; Notable for the following:    Hgb urine dipstick MODERATE (*)    Protein, ur 30 (*)    All other components within normal limits  URINE CULTURE  HIV ANTIBODY (ROUTINE TESTING)  RPR  URINE CYTOLOGY ANCILLARY ONLY    Imaging Review No results found.   MDM   1. Possible exposure to STD   2. Dysuria   3. Essential hypertension    History concerning for likely STD. Urine fairly unremarkable. We'll send off for further evaluation of possible blood. We'll send for urine culture. Treat with 250 mg IM ceftriaxone and 1 g oral azithromycin in clinic. STD panel, HIV, RPR sent.  Hypertension: Patient somewhat agitated due to long wait and concerned about current condition. This is likely why his blood pressure is elevated as it is. Continue current regimen.  Precautions given and all questions answered  Shelly Flatten, MD Family Medicine 10/09/2014, 12:32 PM      Ozella Rocks, MD 10/09/14 [phone]

## 2014-10-10 LAB — URINE CULTURE
COLONY COUNT: NO GROWTH
Culture: NO GROWTH

## 2014-10-10 LAB — RPR: RPR: NONREACTIVE

## 2014-10-10 LAB — URINE CYTOLOGY ANCILLARY ONLY
CHLAMYDIA, DNA PROBE: NEGATIVE
NEISSERIA GONORRHEA: NEGATIVE
TRICH (WINDOWPATH): NEGATIVE

## 2014-10-10 LAB — HIV ANTIBODY (ROUTINE TESTING W REFLEX): HIV Screen 4th Generation wRfx: NONREACTIVE

## 2015-09-29 ENCOUNTER — Inpatient Hospital Stay (HOSPITAL_COMMUNITY)
Admission: EM | Admit: 2015-09-29 | Discharge: 2015-09-30 | DRG: 310 | Disposition: A | Discharge status: Home / Self Care | Payer: Medicare Other | Attending: Internal Medicine | Admitting: Internal Medicine

## 2015-09-29 ENCOUNTER — Encounter (HOSPITAL_COMMUNITY): Payer: Self-pay | Admitting: Emergency Medicine

## 2015-09-29 ENCOUNTER — Emergency Department (HOSPITAL_COMMUNITY): Payer: Medicare Other

## 2015-09-29 DIAGNOSIS — I4891 Unspecified atrial fibrillation: Secondary | ICD-10-CM | POA: Diagnosis present

## 2015-09-29 DIAGNOSIS — T82198A Other mechanical complication of other cardiac electronic device, initial encounter: Secondary | ICD-10-CM | POA: Diagnosis not present

## 2015-09-29 DIAGNOSIS — Z7982 Long term (current) use of aspirin: Secondary | ICD-10-CM | POA: Diagnosis not present

## 2015-09-29 DIAGNOSIS — I480 Paroxysmal atrial fibrillation: Secondary | ICD-10-CM | POA: Diagnosis not present

## 2015-09-29 DIAGNOSIS — I10 Essential (primary) hypertension: Secondary | ICD-10-CM | POA: Insufficient documentation

## 2015-09-29 DIAGNOSIS — I422 Other hypertrophic cardiomyopathy: Secondary | ICD-10-CM | POA: Diagnosis present

## 2015-09-29 DIAGNOSIS — Z95810 Presence of automatic (implantable) cardiac defibrillator: Secondary | ICD-10-CM | POA: Diagnosis not present

## 2015-09-29 DIAGNOSIS — E876 Hypokalemia: Secondary | ICD-10-CM | POA: Diagnosis not present

## 2015-09-29 DIAGNOSIS — N189 Chronic kidney disease, unspecified: Secondary | ICD-10-CM | POA: Diagnosis present

## 2015-09-29 DIAGNOSIS — I1310 Hypertensive heart and chronic kidney disease without heart failure, with stage 1 through stage 4 chronic kidney disease, or unspecified chronic kidney disease: Secondary | ICD-10-CM | POA: Diagnosis present

## 2015-09-29 DIAGNOSIS — N400 Enlarged prostate without lower urinary tract symptoms: Secondary | ICD-10-CM | POA: Diagnosis present

## 2015-09-29 HISTORY — DX: Cardiac arrhythmia, unspecified: I49.9

## 2015-09-29 HISTORY — DX: Cardiomyopathy, unspecified: I42.9

## 2015-09-29 HISTORY — DX: Unspecified atrial fibrillation: I48.91

## 2015-09-29 LAB — COMPREHENSIVE METABOLIC PANEL
ALK PHOS: 66 U/L (ref 38–126)
ALT: 44 U/L (ref 17–63)
ANION GAP: 16 — AB (ref 5–15)
AST: 56 U/L — ABNORMAL HIGH (ref 15–41)
Albumin: 3.8 g/dL (ref 3.5–5.0)
BILIRUBIN TOTAL: 0.4 mg/dL (ref 0.3–1.2)
BUN: 14 mg/dL (ref 6–20)
CALCIUM: 10 mg/dL (ref 8.9–10.3)
CO2: 23 mmol/L (ref 22–32)
CREATININE: 1.69 mg/dL — AB (ref 0.61–1.24)
Chloride: 104 mmol/L (ref 101–111)
GFR, EST AFRICAN AMERICAN: 52 mL/min — AB (ref >60)
GFR, EST NON AFRICAN AMERICAN: 45 mL/min — AB (ref >60)
Glucose, Bld: 101 mg/dL — ABNORMAL HIGH (ref 65–99)
Potassium: 4.1 mmol/L (ref 3.5–5.1)
SODIUM: 143 mmol/L (ref 135–145)
TOTAL PROTEIN: 7.3 g/dL (ref 6.5–8.1)

## 2015-09-29 LAB — CBC WITH DIFFERENTIAL/PLATELET
BASOS ABS: 0 10*3/uL (ref 0.0–0.1)
Basophils Relative: 0 %
EOS ABS: 0 10*3/uL (ref 0.0–0.7)
Eosinophils Relative: 1 %
HEMATOCRIT: 46.4 % (ref 39.0–52.0)
HEMOGLOBIN: 15.4 g/dL (ref 13.0–17.0)
Lymphocytes Relative: 24 %
Lymphs Abs: 1.8 10*3/uL (ref 0.7–4.0)
MCH: 30.7 pg (ref 26.0–34.0)
MCHC: 33.2 g/dL (ref 30.0–36.0)
MCV: 92.4 fL (ref 78.0–100.0)
Monocytes Absolute: 0.8 10*3/uL (ref 0.1–1.0)
Monocytes Relative: 10 %
NEUTROS ABS: 4.9 10*3/uL (ref 1.7–7.7)
NEUTROS PCT: 65 %
Platelets: 183 10*3/uL (ref 150–400)
RBC: 5.02 MIL/uL (ref 4.22–5.81)
RDW: 14.7 % (ref 11.5–15.5)
WBC: 7.6 10*3/uL (ref 4.0–10.5)

## 2015-09-29 LAB — MRSA PCR SCREENING: MRSA by PCR: NEGATIVE

## 2015-09-29 LAB — MAGNESIUM: Magnesium: 1.6 mg/dL — ABNORMAL LOW (ref 1.7–2.4)

## 2015-09-29 LAB — TSH: TSH: 1.205 u[IU]/mL (ref 0.350–4.500)

## 2015-09-29 LAB — TROPONIN I: Troponin I: 0.09 ng/mL — ABNORMAL HIGH (ref <0.031)

## 2015-09-29 MED ORDER — ACETAMINOPHEN 325 MG PO TABS: 650.0000 mg | ORAL_TABLET | ORAL | Status: DC | PRN

## 2015-09-29 MED ORDER — SODIUM CHLORIDE 0.9% FLUSH
3.0000 mL | Freq: Two times a day (BID) | INTRAVENOUS | Status: DC
  Administered 2015-09-29 – 2015-09-30 (×2): 3 mL via INTRAVENOUS

## 2015-09-29 MED ORDER — LISINOPRIL 20 MG PO TABS
40.0000 mg | ORAL_TABLET | Freq: Every day | ORAL | Status: DC
  Administered 2015-09-30: 40 mg via ORAL
  Filled 2015-09-29: qty 2

## 2015-09-29 MED ORDER — DILTIAZEM LOAD VIA INFUSION
10.0000 mg | Freq: Once | INTRAVENOUS | Status: DC
  Filled 2015-09-29: qty 10

## 2015-09-29 MED ORDER — SODIUM CHLORIDE 0.9 % IV SOLN: 250.0000 mL | INTRAVENOUS | Status: DC | PRN

## 2015-09-29 MED ORDER — AMIODARONE HCL IN DEXTROSE 360-4.14 MG/200ML-% IV SOLN
30.0000 mg/h | INTRAVENOUS | Status: DC
  Administered 2015-09-30: 30 mg/h via INTRAVENOUS
  Filled 2015-09-29 (×2): qty 200

## 2015-09-29 MED ORDER — TERAZOSIN HCL 5 MG PO CAPS
10.0000 mg | ORAL_CAPSULE | Freq: Every day | ORAL | Status: DC
  Administered 2015-09-29: 10 mg via ORAL
  Filled 2015-09-29 (×2): qty 2

## 2015-09-29 MED ORDER — AMIODARONE LOAD VIA INFUSION
150.0000 mg | Freq: Once | INTRAVENOUS | Status: AC
  Administered 2015-09-30: 150 mg via INTRAVENOUS
  Filled 2015-09-29: qty 83.34

## 2015-09-29 MED ORDER — AMIODARONE HCL IN DEXTROSE 360-4.14 MG/200ML-% IV SOLN
60.0000 mg/h | INTRAVENOUS | Status: AC
  Administered 2015-09-29: 60 mg/h via INTRAVENOUS
  Filled 2015-09-29: qty 200

## 2015-09-29 MED ORDER — AMIODARONE IV BOLUS ONLY 150 MG/100ML
150.0000 mg | Freq: Once | INTRAVENOUS | Status: AC
  Administered 2015-09-29: 150 mg via INTRAVENOUS
  Filled 2015-09-29: qty 100

## 2015-09-29 MED ORDER — RIVAROXABAN 20 MG PO TABS
20.0000 mg | ORAL_TABLET | Freq: Once | ORAL | Status: AC
  Administered 2015-09-29: 20 mg via ORAL
  Filled 2015-09-29 (×2): qty 1

## 2015-09-29 MED ORDER — ATORVASTATIN CALCIUM 80 MG PO TABS
80.0000 mg | ORAL_TABLET | Freq: Every day | ORAL | Status: DC
  Administered 2015-09-30: 80 mg via ORAL
  Filled 2015-09-29: qty 1

## 2015-09-29 MED ORDER — AMIODARONE HCL IN DEXTROSE 360-4.14 MG/200ML-% IV SOLN
60.0000 mg/h | Freq: Once | INTRAVENOUS | Status: AC
  Administered 2015-09-29: 60 mg/h via INTRAVENOUS
  Filled 2015-09-29: qty 200

## 2015-09-29 MED ORDER — RIVAROXABAN 20 MG PO TABS
20.0000 mg | ORAL_TABLET | Freq: Every day | ORAL | Status: DC
  Administered 2015-09-30: 20 mg via ORAL
  Filled 2015-09-29: qty 1

## 2015-09-29 MED ORDER — ZOLPIDEM TARTRATE 5 MG PO TABS
5.0000 mg | ORAL_TABLET | Freq: Every evening | ORAL | Status: DC | PRN
  Administered 2015-09-29: 5 mg via ORAL
  Filled 2015-09-29: qty 1

## 2015-09-29 MED ORDER — DILTIAZEM HCL 100 MG IV SOLR: 5.0000 mg/h | INTRAVENOUS | Status: DC

## 2015-09-29 MED ORDER — AMLODIPINE BESYLATE 10 MG PO TABS
10.0000 mg | ORAL_TABLET | Freq: Every day | ORAL | Status: DC
  Administered 2015-09-30: 10 mg via ORAL
  Filled 2015-09-29: qty 1

## 2015-09-29 MED ORDER — DILTIAZEM HCL ER COATED BEADS 240 MG PO CP24
240.0000 mg | ORAL_CAPSULE | Freq: Every day | ORAL | Status: DC
  Administered 2015-09-29 – 2015-09-30 (×2): 240 mg via ORAL
  Filled 2015-09-29 (×2): qty 1

## 2015-09-29 MED ORDER — ETOMIDATE 2 MG/ML IV SOLN
10.0000 mg | Freq: Once | INTRAVENOUS | Status: AC
  Administered 2015-09-29: 10 mg via INTRAVENOUS
  Filled 2015-09-29: qty 10

## 2015-09-29 MED ORDER — CARVEDILOL 25 MG PO TABS
25.0000 mg | ORAL_TABLET | Freq: Two times a day (BID) | ORAL | Status: DC
  Administered 2015-09-29 – 2015-09-30 (×3): 25 mg via ORAL
  Filled 2015-09-29 (×3): qty 1

## 2015-09-29 MED ORDER — SODIUM CHLORIDE 0.9% FLUSH: 3.0000 mL | INTRAVENOUS | Status: DC | PRN

## 2015-09-29 MED ORDER — ONDANSETRON HCL 4 MG/2ML IJ SOLN: 4.0000 mg | Freq: Four times a day (QID) | INTRAMUSCULAR | Status: DC | PRN

## 2015-09-29 NOTE — ED Notes (Signed)
Pt states that his AICD fired-- first time -- denies chest pain-- happened after walking up and down steps a couple times. Pt is a pt at [place] in [facility]

## 2015-09-29 NOTE — Progress Notes (Signed)
RT placed patient on CPAP  HS on auto. No O2 bleed in needed. Patient tolerating well at this time. RT will continue to monitor as needed.

## 2015-09-29 NOTE — Progress Notes (Signed)
  Amiodarone Drug - Drug Interaction Consult Note  Recommendations: Monitor patient's hemodynamics. Continue current therapy  Amiodarone is metabolized by the cytochrome P450 system and therefore has the potential to cause many drug interactions. Amiodarone has an average plasma half-life of 50 days (range 20 to 100 days).   There is potential for drug interactions to occur several weeks or months after stopping treatment and the onset of drug interactions may be slow after initiating amiodarone.    Statins: Increased risk of myopathy. Simvastatin- restrict dose to  daily. Other statins: counsel patients to report any muscle pain or weakness immediately.   Anticoagulants: Amiodarone can increase anticoagulant effect. Consider warfarin dose reduction. Patients should be monitored closely and the dose of anticoagulant altered accordingly, remembering that amiodarone levels take several weeks to stabilize.   Antiepileptics: Amiodarone can increase plasma concentration of phenytoin, the dose should be reduced. Note that small changes in phenytoin dose can result in large changes in levels. Monitor patient and counsel on signs of toxicity.   Beta blockers: increased risk of bradycardia, AV block and myocardial depression. Sotalol - avoid concomitant use.    Calcium channel blockers (diltiazem and verapamil): increased risk of bradycardia, AV block and myocardial depression.    Cyclosporine: Amiodarone increases levels of cyclosporine. Reduced dose of cyclosporine is recommended.   Digoxin dose should be halved when amiodarone is started.   Diuretics: increased risk of cardiotoxicity if hypokalemia occurs.   Oral hypoglycemic agents (glyburide, glipizide, glimepiride): increased risk of hypoglycemia. Patient's glucose levels should be monitored closely when initiating amiodarone therapy.    Drugs that prolong the QT interval:  Torsades de pointes risk may be  increased with concurrent use - avoid if possible.  Monitor QTc, also keep magnesium/potassium WNL if concurrent therapy can't be avoided. Marland Kitchen Antibiotics: e.g. fluoroquinolones, erythromycin. . Antiarrhythmics: e.g. quinidine, procainamide, disopyramide, sotalol. . Antipsychotics: e.g. phenothiazines, haloperidol.  . Lithium, tricyclic antidepressants, and methadone. Thank You, Isaac Bliss, PharmD, BCPS, [facility] Clinical Pharmacist Pager [phone] 09/29/2015 7:58 PM

## 2015-09-29 NOTE — ED Notes (Signed)
Pt from triage with c/o defib fired and tachycardia. Pt denies pain at present.

## 2015-09-29 NOTE — H&P (Addendum)
CARDIOLOGY ADMIT NOTE     Primary Care Physician: Default, Provider, MD Referring Physician:  Dr Rubin Payor  Admit Date: 09/29/2015  Reason for consultation:  ICD shock  Glenn [name] is a 50 y.o. male with a h/o "arrhythmia" and "enlarged heart" s/p MDT ICD followed at the [facility] who now presents with ICD shock.  The patient reports being in his usual state of health at home when he had ICD shock at 14:40.  He had just walked outside and sat on his porch when his ICD deployed.  EMS was called and he was found to have afib with RVR.  He was brought to [facility] for further evaluation.  In the ED, he has been observed to have AF with V rates frequently into the 180s.  He is otherwise unaware of afib.   He denies symptoms of palpitations, chest pain, shortness of breath, orthopnea, PND, lower extremity edema, dizziness, presyncope, syncope, or neurologic sequela. The patient is tolerating medications without difficulties and is otherwise without complaint today.   He does not recall having a diagnosis of atrial fibrillation.  Per his ICD interrogation, his afib appears to be new.  He reports having "arrhythmia" previously for which he has been placed on diltiazem.  Past Medical History  Diagnosis Date  . Hypertension   . Gout   . A-fib (HCC) 09/29/15  . Enlarged prostate   . Cardiomyopathy [facility])    Past Surgical History  Procedure Laterality Date  . Cardiac defibrillator placement  2013    Medtronic Evera dual chamber ICD implanted  by Dr Wendi Maya at [facility]    . etomidate  10 mg Intravenous Once  . Rivaroxaban  20 mg Oral Once     Home meds reviewed  Allergies  Allergen Reactions  . Penicillins Other (See Comments)    Childhood allergy    Social History   Social History  . Marital Status: Married    Spouse Name: N/A  . Number of Children: N/A  . Years of Education: N/A   Occupational History  . Not on file.   Social History Main Topics  . Smoking status: Never  Smoker   . Smokeless tobacco: Not on file  . Alcohol Use: Yes     Comment: weekend use  . Drug Use: No  . Sexual Activity: Not on file   Other Topics Concern  . Not on file   Social History Narrative    Family History  Problem Relation Age of Onset  . Diabetes Mother   . Hypertension Mother   . Hypertension Father     ROS- All systems are reviewed and negative except as per the HPI above  Physical Exam: Telemetry: Filed Vitals:   09/29/15 1734 09/29/15 1735 09/29/15 1736 09/29/15 1737  BP:      Pulse: 152 151 153 157  Temp:      TempSrc:      Resp: Height:      Weight:      SpO2: 97% 98% 99% 98%    GEN- The patient is diaphoretic appearing, alert and oriented x 3 today.   Head- normocephalic, atraumatic Eyes-  Sclera clear, conjunctiva pink Ears- hearing intact Oropharynx- clear Neck- supple, no JVP Lungs- Clear to ausculation bilaterally, normal work of breathing Heart- tachycardic irregular rhythm GI- soft, NT, ND, + BS Extremities- no clubbing, cyanosis, or edema MS- no significant deformity or atrophy Skin- no rash or lesion Psych- euthymic mood, full  affect Neuro- strength and sensation are intact  EKG reveals afib with RVR, nonspecific ST/T changes  CXR is reviewed and reveals dual chamber MDT ICD and mild edema  Labs:   Lab Results  Component Value Date   WBC 7.6 09/29/2015   HGB 15.4 09/29/2015   HCT 46.4 09/29/2015   MCV 92.4 09/29/2015   PLT 183 09/29/2015     Recent Labs Lab 09/29/15 1619  NA 143  K 4.1  CL 104  CO2 23  BUN 14  CREATININE 1.69*  CALCIUM 10.0  PROT 7.3  BILITOT 0.4  ALKPHOS 66  ALT 44  AST 56*  GLUCOSE 101*   Lab Results  Component Value Date   CKTOTAL 178 08/13/2010   CKMB 3.8 08/13/2010   TROPONINI 0.09* 09/29/2015   No results found for: CHOL No results found for: HDL No results found for: LDLCALC No results found for: TRIG No results found for: CHOLHDL No results found for:  LDLDIRECT     Echo: pending  ICD interrogation:  Personally performed in entirety by Dr Johney Frame Medtronic Evera XT DR implanted 03/31/12, battery longevity 7 years.  Normal atrial and ventricular lead sensing, threshold, and impedance values.  Presenting rhythm is afib.  He received 35 J shock for tachycardia in the VF zone at 14:40 (Afib with RVR) with termination of AF and immediate return to afib.  I have reprogrammed VF zone from 200 to 220 bpm.  VT zone remains as prior --> 176-222 bpm with burst x 3, 20j,35Jx4.  Huston Foley setting is DDDR 70-100 bpm with AV .  He is 21% A paced and <.1% V paced.  ASSESSMENT AND PLAN:   1. afib with RVR Appears to be new onset chads2vasc score is at least 2 (HTN, cardiomyopathy) I have reprogrammed ICD to change VF zone from 200 to 222 bpm.  VT zone remains 176-222 with discriminators on. Will start xarelto  daily As AF terminated with ICD shock but immediately restarted, will start on IV amiodarone.  Dr Rubin Payor and I have spoken with the patient who is willing to proceed with ED cardioversion.  Hopefully we can restore sinus rhythm in ED and then closely manage the patient in stepdown unit overnight.  His rhythm is unstable. Will increase home diltiazem and stop norpace Echo to evaluate for underlying structural heart disease  2. Cardiomyopathy As he was on norpace, I am suspicious that he may have hypertrophic CM, though forces are not very large on surface ekg and I do not hear a murmur. He is also on coreg and lisinopril which suggests possible reduced EF.  He denies prior CAD.   Will need records from [facility] obtained in am. optivol not elevated and no chf on exam. ICD as above  3. Hypertensive cardiovascular disease Resume home medicines Titrate diltiazem  4. CRI Will follow creatinine closely while here CrCl 86  Pt is currently ill with unstable arrhythmia and ICD shock.  Will require inpatient stay for at least 2 nights and close  management.  He is at risk for decompensation/ additional ICD shocks.  Also requires urgent ED cardioversion due to relative instability.   Hillis Range, MD 09/29/2015  5:53 PM

## 2015-09-29 NOTE — Progress Notes (Signed)
md notified.  Pt on iv amio. And po meds given.  Hr still 140-150 afib.  Pt asymptomatic .  Will continue to monitor. Karena Addison T

## 2015-09-29 NOTE — ED Provider Notes (Signed)
CSN: [id number]     Arrival date & time 09/29/15  1559 History   First MD Initiated Contact with Patient 09/29/15 1614     Chief Complaint  Patient presents with  . AICD fired   . Tachycardia      The history is provided by the patient.   patient presents after his AICD fired. States he walked upstairs and came down he felt a little short of breath. He states this is not unusual for walking on the stairs. States he then felt the ASVD fire. Said that he felt as if he got kicked in the chest. He has not had it fire before. Poorly has history of A. fib and hypertension. He gets managed at the [place]. No chest pain. No trouble breathing. No fevers or chills. States he is compliant with his medicines and no recent change.  Past Medical History  Diagnosis Date  . Hypertension   . Gout   . A-fib (HCC) 09/29/15  . Enlarged prostate   . Cardiomyopathy [facility])    Past Surgical History  Procedure Laterality Date  . Cardiac defibrillator placement  2013    Medtronic Evera dual chamber ICD implanted  by Dr Wendi Maya at [facility]   Family History  Problem Relation Age of Onset  . Diabetes Mother   . Hypertension Mother   . Hypertension Father    Social History  Substance Use Topics  . Smoking status: Never Smoker   . Smokeless tobacco: None  . Alcohol Use: Yes     Comment: weekend use    Review of Systems  Constitutional: Negative for activity change and appetite change.  Eyes: Negative for pain.  Respiratory: Positive for shortness of breath. Negative for chest tightness.   Cardiovascular: Negative for chest pain and leg swelling.  Gastrointestinal: Negative for nausea, vomiting, abdominal pain and diarrhea.  Genitourinary: Negative for flank pain.  Musculoskeletal: Negative for back pain and neck stiffness.  Skin: Negative for rash.  Neurological: Negative for weakness, numbness and headaches.  Psychiatric/Behavioral: Negative for behavioral problems.      Allergies   Penicillins  Home Medications   Prior to Admission medications   Medication Sig Start Date End Date Taking? Authorizing Provider  amLODipine (NORVASC) 10 MG tablet Take 10 mg by mouth daily.   Yes Historical Provider, MD  aspirin 81 MG chewable tablet Chew 81 mg by mouth daily.   Yes Historical Provider, MD  atorvastatin (LIPITOR) 80 MG tablet Take 80 mg by mouth daily.   Yes Historical Provider, MD  carvedilol (COREG) 25 MG tablet Take 25 mg by mouth 2 (two) times daily with a meal.   Yes Historical Provider, MD  cholecalciferol (VITAMIN D) 1000 UNITS tablet Take 1,000 Units by mouth daily.   Yes Historical Provider, MD  diltiazem (DILACOR XR) 120 MG 24 hr capsule Take 120 mg by mouth daily.   Yes Historical Provider, MD  disopyramide (NORPACE CR) 100 MG 12 hr capsule Take by mouth 2 (two) times daily.   Yes Historical Provider, MD  loperamide (IMODIUM) 2 MG capsule Take 2 mg by mouth as needed for diarrhea or loose stools (q4 hours as needed for diarrhea).   Yes Historical Provider, MD  magnesium oxide (MAG-OX) 400 MG tablet Take 400 mg by mouth daily.   Yes Historical Provider, MD  terazosin (HYTRIN) 5 MG capsule Take 10 mg by mouth at bedtime.    Yes Historical Provider, MD  albuterol (PROVENTIL HFA;VENTOLIN HFA) 108 (90 BASE) MCG/ACT inhaler  Inhale 1-2 puffs into the lungs every 6 (six) hours as needed for wheezing or shortness of breath. 08/24/14   Courtney Forcucci, PA-C  Cetirizine HCl (ZYRTEC ALLERGY) 10 MG CAPS Take 1 capsule (10 mg total) by mouth at bedtime. 08/24/14   Courtney Forcucci, PA-C  clotrimazole (LOTRIMIN) 1 % cream Apply 1 application topically 2 (two) times daily.    Historical Provider, MD  lisinopril (PRINIVIL,ZESTRIL) 40 MG tablet Take 40 mg by mouth daily.    Historical Provider, MD  oxyCODONE-acetaminophen (PERCOCET/ROXICET) 5-325 MG per tablet Take 1-2 tablets by mouth every 6 (six) hours as needed for pain. 12/10/12   Susy Frizzle, MD  phenazopyridine (PYRIDIUM)  100 MG tablet Take 1-2 tablets (100-200 mg total) by mouth 3 (three) times daily as needed for pain. 10/09/14   Ozella Rocks, MD   BP 131/113 mmHg  Pulse 33  Temp(Src) 98.5 F (36.9 C) (Oral)  Resp 27  Ht 6' (1.829 m)  Wt 248 lb 3 oz (112.577 kg)  BMI 33.65 kg/m2  SpO2 99% Physical Exam  Constitutional: He is oriented to person, place, and time. He appears well-developed and well-nourished.  HENT:  Head: Normocephalic and atraumatic.  Eyes: Pupils are equal, round, and reactive to light.  Neck: Normal range of motion.  Cardiovascular:  Tachycardia  Pulmonary/Chest: Effort normal and breath sounds normal.  AICD to left upper chest wall  Abdominal: Soft. Bowel sounds are normal. He exhibits no distension.  Musculoskeletal: Normal range of motion. He exhibits no edema.  Neurological: He is alert and oriented to person, place, and time.  Skin: Skin is warm and dry.  Nursing note and vitals reviewed.   ED Course  .Cardioversion Date/Time: 09/29/2015 6:30 PM Performed by: Benjiman Core Authorized by: Benjiman Core Consent: Verbal consent obtained. Written consent obtained. Risks and benefits: risks, benefits and alternatives were discussed Consent given by: patient Patient understanding: patient states understanding of the procedure being performed Patient consent: the patient's understanding of the procedure matches consent given Required items: required blood products, implants, devices, and special equipment available Patient identity confirmed: verbally with patient, arm band and provided demographic data Time out: Immediately prior to procedure a "time out" was called to verify the correct patient, procedure, equipment, support staff and site/side marked as required. Patient sedated: yes Sedation type: moderate (conscious) sedation Sedatives: etomidate Sedation start date/time: 09/29/2015 6:30 PM Sedation end date/time: 09/29/2015 6:35 PM Cardioversion basis:  emergent Pre-procedure rhythm: atrial fibrillation Patient position: patient was placed in a supine position Chest area: chest area exposed Electrodes: pads Electrodes placed: anterior-posterior Number of attempts: 2 Attempt 1 mode: synchronous Attempt 1 waveform: biphasic Attempt 1 shock (in Joules): 120 Cardioversion outcome attempt one: Briefly sinus then conversion back into atrial fibrillation. Attempt 2 mode: synchronous Attempt 2 waveform: biphasic Attempt 2 shock (in Joules): 150 Cardioversion outcome attempt two: briefly sinus, then back to afib. Post-procedure rhythm: atrial fibrillation Complications: no complications Patient tolerance: Patient tolerated the procedure well with no immediate complications   (including critical care time) Labs Review Labs Reviewed  TROPONIN I - Abnormal; Notable for the following:    Troponin I 0.09 (*)    All other components within normal limits  COMPREHENSIVE METABOLIC PANEL - Abnormal; Notable for the following:    Glucose, Bld 101 (*)    Creatinine, Ser 1.69 (*)    AST 56 (*)    GFR calc non Af Amer 45 (*)    GFR calc Af Amer 52 (*)  Anion gap 16 (*)    All other components within normal limits  MAGNESIUM - Abnormal; Notable for the following:    Magnesium 1.6 (*)    All other components within normal limits  CBC WITH DIFFERENTIAL/PLATELET  TSH    Imaging Review Dg Chest Portable 1 View  09/29/2015  CLINICAL DATA:  Defibrillator firing. History of cardiomyopathy, atrial fibrillation and hypertension. EXAM: PORTABLE CHEST 1 VIEW COMPARISON:  08/24/2014. FINDINGS: 1638 hours. There are significantly lower lung volumes. The left subclavian AICD leads appear unchanged within the right atrium and right ventricle. The heart size and mediastinal contours are stable for the degree of inspiration. There is bibasilar atelectasis with possible mild pulmonary edema. No confluent airspace opacity or significant pleural effusion seen.  IMPRESSION: Low lung volumes with bibasilar atelectasis and possible mild edema. Electronically Signed   By: Carey Bullocks M.D.   On: 09/29/2015 17:31   I have personally reviewed and evaluated these images and lab results as part of my medical decision-making.   EKG Interpretation   Date/Time:  Sunday September 29 2015 16:03:29 EST Ventricular Rate:  189 PR Interval:    QRS Duration: 86 QT Interval:  256 QTC Calculation: 453 R Axis:   -38 Text Interpretation:  Atrial fibrillation Left axis deviation ST \\T \ T  wave abnormality, consider lateral ischemia Abnormal ECG Confirmed by  Rubin Payor  MD, Harrold Donath [phone]) on 09/29/2015 4:23:46 PM      MDM   Final diagnoses:  Atrial fibrillation with rapid ventricular response (HCC)  Inappropriate shocks from ICD (implantable cardioverter-defibrillator), initial encounter    Patient presented after an AICD firing. History of A. fib. Chads score of 2. Discuss with Dr. Johney Frame, who also saw the patient the ER. Appears to fired for an A. fib with a rate that went over 200. Continued A. fib here. Started on amiodarone and then cardioverted. He was briefly in sinus rhythm after synchronized cardioversion at 120 and 150 J. He however quickly went back into an A. fib with RVR. PRE had amiodarone and is on a bolus. Will discuss with cardiology about further medication titration. Admit to stepdown.CRITICAL CARE Performed by: Billee Cashing Total critical care time:30 minutes Critical care time was exclusive of separately billable procedures and treating other patients. Critical care was necessary to treat or prevent imminent or life-threatening deterioration. Critical care was time spent personally by me on the following activities: development of treatment plan with patient and/or surrogate as well as nursing, discussions with consultants, evaluation of patient's response to treatment, examination of patient, obtaining history from patient or  surrogate, ordering and performing treatments and interventions, ordering and review of laboratory studies, ordering and review of radiographic studies, pulse oximetry and re-evaluation of patient's condition.        Benjiman Core, MD 09/29/15 [phone]

## 2015-09-30 ENCOUNTER — Inpatient Hospital Stay (HOSPITAL_COMMUNITY): Payer: Medicare Other

## 2015-09-30 DIAGNOSIS — E876 Hypokalemia: Secondary | ICD-10-CM

## 2015-09-30 DIAGNOSIS — I10 Essential (primary) hypertension: Secondary | ICD-10-CM

## 2015-09-30 DIAGNOSIS — I422 Other hypertrophic cardiomyopathy: Secondary | ICD-10-CM

## 2015-09-30 LAB — LIPID PANEL
CHOL/HDL RATIO: 4 ratio
Cholesterol: 182 mg/dL (ref 0–200)
HDL: 46 mg/dL (ref >40)
LDL Cholesterol: 102 mg/dL — ABNORMAL HIGH (ref 0–99)
TRIGLYCERIDES: 169 mg/dL — AB (ref <150)
VLDL: 34 mg/dL (ref 0–40)

## 2015-09-30 LAB — CBC
HEMATOCRIT: 42.4 % (ref 39.0–52.0)
Hemoglobin: 13.8 g/dL (ref 13.0–17.0)
MCH: 30 pg (ref 26.0–34.0)
MCHC: 32.5 g/dL (ref 30.0–36.0)
MCV: 92.2 fL (ref 78.0–100.0)
PLATELETS: 147 10*3/uL — AB (ref 150–400)
RBC: 4.6 MIL/uL (ref 4.22–5.81)
RDW: 14.7 % (ref 11.5–15.5)
WBC: 6.2 10*3/uL (ref 4.0–10.5)

## 2015-09-30 LAB — BASIC METABOLIC PANEL
Anion gap: 11 (ref 5–15)
BUN: 13 mg/dL (ref 6–20)
CO2: 24 mmol/L (ref 22–32)
CREATININE: 1.5 mg/dL — AB (ref 0.61–1.24)
Calcium: 8.9 mg/dL (ref 8.9–10.3)
Chloride: 105 mmol/L (ref 101–111)
GFR, EST NON AFRICAN AMERICAN: 52 mL/min — AB (ref >60)
Glucose, Bld: 95 mg/dL (ref 65–99)
Potassium: 3 mmol/L — ABNORMAL LOW (ref 3.5–5.1)
SODIUM: 140 mmol/L (ref 135–145)

## 2015-09-30 LAB — POTASSIUM
Potassium: 3.4 mmol/L — ABNORMAL LOW (ref 3.5–5.1)
Potassium: 3.7 mmol/L (ref 3.5–5.1)

## 2015-09-30 MED ORDER — DILTIAZEM HCL ER COATED BEADS 240 MG PO CP24: 240.0000 mg | ORAL_CAPSULE | Freq: Every day | ORAL | Status: DC

## 2015-09-30 MED ORDER — AMIODARONE HCL 200 MG PO TABS
400.0000 mg | ORAL_TABLET | Freq: Two times a day (BID) | ORAL | Status: DC
  Administered 2015-09-30: 400 mg via ORAL
  Filled 2015-09-30: qty 2

## 2015-09-30 MED ORDER — RIVAROXABAN 20 MG PO TABS: 20.0000 mg | ORAL_TABLET | Freq: Every day | ORAL | Status: DC

## 2015-09-30 MED ORDER — POTASSIUM CHLORIDE CRYS ER 20 MEQ PO TBCR
40.0000 meq | EXTENDED_RELEASE_TABLET | Freq: Once | ORAL | Status: AC
  Administered 2015-09-30: 40 meq via ORAL
  Filled 2015-09-30: qty 2

## 2015-09-30 MED ORDER — OFF THE BEAT BOOK
Freq: Once | Status: AC
  Administered 2015-09-30: 11:00:00
  Filled 2015-09-30: qty 1

## 2015-09-30 MED ORDER — SPIRONOLACTONE 25 MG PO TABS
12.5000 mg | ORAL_TABLET | Freq: Every day | ORAL | Status: DC
  Administered 2015-09-30: 12.5 mg via ORAL
  Filled 2015-09-30: qty 1

## 2015-09-30 MED ORDER — PERFLUTREN LIPID MICROSPHERE
1.0000 mL | INTRAVENOUS | Status: AC | PRN
  Administered 2015-09-30: 2 mL via INTRAVENOUS
  Filled 2015-09-30: qty 10

## 2015-09-30 MED ORDER — SPIRONOLACTONE 25 MG PO TABS: 12.5000 mg | ORAL_TABLET | Freq: Every day | ORAL | Status: DC

## 2015-09-30 MED ORDER — AMIODARONE HCL 400 MG PO TABS: 400.0000 mg | ORAL_TABLET | Freq: Two times a day (BID) | ORAL | Status: DC

## 2015-09-30 MED ORDER — LISINOPRIL 10 MG PO TABS: 10.0000 mg | ORAL_TABLET | Freq: Every day | ORAL | Status: DC

## 2015-09-30 NOTE — Progress Notes (Signed)
Pt converted to NSR hr 70's bp 133/100.  Will continue to monitor Glenn [name]

## 2015-09-30 NOTE — Care Management Note (Addendum)
Case Management Note  Patient Details  Name: Glenn [name] MRN: [id number] Date of Birth: [birth year]  Subjective/Objective:       Adm w icd shock and at fib           Action/Plan:lives w wife   Expected Discharge Date:                Expected Discharge Plan:     In-House Referral:     Discharge planning Services     Post Acute Care Choice:    Choice offered to:     DME Arranged:    DME Agency:     HH Arranged:    HH Agency:     Status of Service:     Medicare Important Message Given:    Date Medicare IM Given:    Medicare IM give by:    Date Additional Medicare IM Given:    Additional Medicare Important Message give by:     If discussed at Long Length of Stay Meetings, dates discussed:     ur review done, gave pt 30day free and 0 dollar copay card for xarelto. Pt states he has ins.  Hanley Hays, RN 09/30/2015, 9:39 AM

## 2015-09-30 NOTE — Progress Notes (Addendum)
  Echocardiogram 2D Echocardiogram with Definityhas been performed.  Jaden Abreu 09/30/2015, 11:22 AM

## 2015-09-30 NOTE — Discharge Instructions (Addendum)
You need to call to make an appointment to see your cardiologist at the [facility] to be seen and have follow up labs done by Friday this week.       Information on my medicine - XARELTO (Rivaroxaban)  This medication education was reviewed with me or my healthcare representative as part of my discharge preparation.  The pharmacist that spoke with me during my hospital stay was:  Raylyn, [place], [facility]  Why was Xarelto prescribed for you? Xarelto was prescribed for you to reduce the risk of a blood clot forming that can cause a stroke if you have a medical condition called atrial fibrillation (a type of irregular heartbeat).  What do you need to know about xarelto ? Take your Xarelto ONCE DAILY at the same time every day with your evening meal. If you have difficulty swallowing the tablet whole, you may crush it and mix in applesauce just prior to taking your dose.  Take Xarelto exactly as prescribed by your doctor and DO NOT stop taking Xarelto without talking to the doctor who prescribed the medication.  Stopping without other stroke prevention medication to take the place of Xarelto may increase your risk of developing a clot that causes a stroke.  Refill your prescription before you run out.  After discharge, you should have regular check-up appointments with your healthcare provider that is prescribing your Xarelto.  In the future your dose may need to be changed if your kidney function or weight changes by a significant amount.  What do you do if you miss a dose? If you are taking Xarelto ONCE DAILY and you miss a dose, take it as soon as you remember on the same day then continue your regularly scheduled once daily regimen the next day. Do not take two doses of Xarelto at the same time or on the same day.   Important Safety Information A possible side effect of Xarelto is bleeding. You should call your healthcare provider right away if you experience any of the  following: ? Bleeding from an injury or your nose that does not stop. ? Unusual colored urine (red or dark brown) or unusual colored stools (red or black). ? Unusual bruising for unknown reasons. ? A serious fall or if you hit your head (even if there is no bleeding).  Some medicines may interact with Xarelto and might increase your risk of bleeding while on Xarelto. To help avoid this, consult your healthcare provider or pharmacist prior to using any new prescription or non-prescription medications, including herbals, vitamins, non-steroidal anti-inflammatory drugs (NSAIDs) and supplements.  This website has more information on Xarelto: VisitDestination.com.br.

## 2015-09-30 NOTE — Progress Notes (Signed)
Md notified Hr still 140-160.  Pt asymptomatic.  Pt request for cpap.  New orders received.  Will given coreg 25 now.  Wait 1 hr then give 150 bolus of amio.  Then wait 1 hr and cut amio dose in 1/2 to 30.  Will continue to monitor. Karena Addison T

## 2015-09-30 NOTE — Progress Notes (Signed)
Dc instructions given to pt at this time.  Pt verbalized understanding of all instructions.  Pt is still in NSR.  No s/s of any acute distress or c/o pain.

## 2015-09-30 NOTE — Discharge Summary (Signed)
ELECTROPHYSIOLOGY PROCEDURE DISCHARGE SUMMARY    Patient ID: Glenn [name],  MRN: [id number], DOB/AGE: 05/06/1966 50 y.o.  Admit date: 09/29/2015 Discharge date: 09/30/2015  Primary Care Physician: Default, Provider, MD Primary Cardiologist: [facility]  Primary Discharge Diagnosis:  Active Problems:   Atrial fibrillation with RVR [facility])   Atrial fibrillation (HCC)   Allergies  Allergen Reactions  . Ace Inhibitors Swelling    Swelling lips  . Penicillins Other (See Comments)    Childhood allergy    Procedures This Admission:  09/29/15 in ED, synchronized cardioversion at 120 and 150 J.  Brief HPI:  The patient sought medical attention after getting shocked by his device, EMS was called, he was noted in rapid AF and brought to the ER where he was given Amiodarone and cardioverted, though quickly back into Afib and admitted.  Hospital Course: Glenn [name] is a 50 y.o. male admitted to [facility] 09/28/14 after getting shocked by his ICD, he had no pre or post shock symptoms. Stated he was feeling well, got shocked "out of nowhere".   His PMHx vague with somewhat poor historian regarding why he had gotten an ICD, an enlarged heart, and unclear hx of arrhythmia and syncope.  HTN, morbid obesity, gout.  He denied any kind of CP and no overt feeling of palpitations, no near syncope or syncope, no SOB.  Interrogation of his device by Dr. Johney Frame showed Medtronic Evera XT DR implanted 03/31/12, battery longevity 7 years. Normal atrial and ventricular lead sensing, threshold, and impedance values. Presenting rhythm is afib. He received 35 J shock for tachycardia in the VF zone at 14:40 (Afib with RVR) with termination of AF and immediate return to afib. I have reprogrammed VF zone from 200 to 220 bpm. VT zone remains as prior --> 176-222 bpm with burst x 3, 20j,35Jx4. Huston Foley setting is DDDR 70-100 bpm with AV . He is 21% A paced and <.1% V paced.  He was started on amiodarone gtt  and Xarelto, had DCCV in the ED though quickly went back into AF, he was admitted to the ICU and early this morning her did convert into SR.  The patient symptomatically can not tell the difference.   He is noted hypokalemic this morning, and started on Aldactone given her reports has had low K+ historically and advised to eat high potassium foods, and given PO potassium replacement as well.  His repeat K+ this afternoon is 3.7.  His Creat 1.50 this morning improved from 1.69, with GFR >60 and Cal Cr.Cl 91.  His TSH was wnl.  He was not felt to be in CHF by exam or optivol measurement. He had an echo done showing severe focal basal and moderate concentric hypertrophy. Systolic function was vigorous. The estimated ejection fraction was in the range of 65% to 70%.  His BP was elevated and his home diltiazem was up-titrated with improvement.  The patients home list of medicine listed Lisinopril, though the patient tells me this was stopped secondary to his lips swelling up, and was told not to take any ACE inhibitors, these were added to his allergy list. His HR, rhythm remains stable in SR, he is being sent home to continue amiodarone  PO BID until seen by his primary cardiologist at the [facility] clinic for further management.  He has been instructed that he needs to be seen for medication management and to have lab drawn to recheck his potassium by Friday this week.  Should  he run into any issues with this is instructed to contact our office.  Dr Graciela Husbands discussed with the patient importance of f/u on presumptive diagnosis of HCM regarding genetic testing as well with his primary cardiologist, assured that we remain available to him as well should he need out patient.  He states understanding.  Physical Exam: Filed Vitals:   09/30/15 1300 09/30/15 1400 09/30/15 1500 09/30/15 1600  BP: 128/85 124/69  134/84  Pulse:    79  Temp:    98.2 F (36.8 C)  TempSrc:    Oral  Resp: Height:      Weight:      SpO2:    98%     Labs:   Lab Results  Component Value Date   WBC 6.2 09/30/2015   HGB 13.8 09/30/2015   HCT 42.4 09/30/2015   MCV 92.2 09/30/2015   PLT 147* 09/30/2015    Recent Labs Lab 09/29/15 1619 09/30/15 0235  09/30/15 1558  NA 143 140  --   --   K 4.1 3.0*  < > 3.7  CL 104 105  --   --   CO2 23 24  --   --   BUN 14 13  --   --   CREATININE 1.69* 1.50*  --   --   CALCIUM 10.0 8.9  --   --   PROT 7.3  --   --   --   BILITOT 0.4  --   --   --   ALKPHOS 66  --   --   --   ALT 44  --   --   --   AST 56*  --   --   --   GLUCOSE 101* 95  --   --   < > = values in this interval not displayed.   Discharge Medications:  Current Discharge Medication List    START taking these medications   Details  amiodarone (PACERONE) 400 MG tablet Take 1 tablet (400 mg total) by mouth 2 (two) times daily. Qty: 60 tablet, Refills: 0    diltiazem (CARDIZEM CD) 240 MG 24 hr capsule Take 1 capsule (240 mg total) by mouth daily. Qty: 30 capsule, Refills: 0    rivaroxaban (XARELTO) 20 MG TABS tablet Take 1 tablet (20 mg total) by mouth daily with supper. Qty: 30 tablet, Refills: 0    spironolactone (ALDACTONE) 25 MG tablet Take 0.5 tablets (12.5 mg total) by mouth daily. Qty: 30 tablet, Refills: 0      CONTINUE these medications which have NOT CHANGED   Details  amLODipine (NORVASC) 10 MG tablet Take 10 mg by mouth daily.    atorvastatin (LIPITOR) 80 MG tablet Take 80 mg by mouth daily.    carvedilol (COREG) 25 MG tablet Take 25 mg by mouth 2 (two) times daily with a meal.    cholecalciferol (VITAMIN D) 1000 UNITS tablet Take 1,000 Units by mouth daily.    loperamide (IMODIUM) 2 MG capsule Take 2 mg by mouth as needed for diarrhea or loose stools (q4 hours as needed for diarrhea).    magnesium oxide (MAG-OX) 400 MG tablet Take 400 mg by mouth daily.    terazosin (HYTRIN) 5 MG capsule Take 10 mg by mouth at bedtime.       STOP taking these  medications     aspirin 81 MG chewable tablet      diltiazem (DILACOR XR) 120 MG 24 hr  capsule      disopyramide (NORPACE CR) 100 MG 12 hr capsule      albuterol (PROVENTIL HFA;VENTOLIN HFA) 108 (90 BASE) MCG/ACT inhaler      Cetirizine HCl (ZYRTEC ALLERGY) 10 MG CAPS      clotrimazole (LOTRIMIN) 1 % cream      oxyCODONE-acetaminophen (PERCOCET/ROXICET) 5-325 MG per tablet      phenazopyridine (PYRIDIUM) 100 MG tablet      lisinopril (PRINIVIL,ZESTRIL) 40 MG tablet         Disposition: Pt is being discharged home today in good condition. Discharge Instructions    Diet - low sodium heart healthy    Complete by:  As directed      Increase activity slowly    Complete by:  As directed             Duration of Discharge Encounter: Greater than 30 minutes including physician time.  Norma Fredrickson, PA-C 09/30/2015 5:38 PM

## 2015-09-30 NOTE — Progress Notes (Signed)
SUBJECTIVE: The patient is doing well today.  At this time, he denies chest pain, shortness of breath, or any new concerns.  Marland Kitchen amLODipine  10 mg Oral Daily  . atorvastatin  80 mg Oral q1800  . carvedilol  25 mg Oral BID WC  . diltiazem  240 mg Oral Daily  . lisinopril  40 mg Oral Daily  . rivaroxaban  20 mg Oral Q supper  . sodium chloride flush  3 mL Intravenous Q12H  . terazosin  10 mg Oral QHS   . amiodarone 30 mg/hr (09/30/15 0325)    OBJECTIVE: Physical Exam: Filed Vitals:   09/30/15 0322 09/30/15 0400 09/30/15 0500 09/30/15 0600  BP: 143/102 137/93 139/91 135/91  Pulse:      Temp: 98.1 F (36.7 C)     TempSrc: Axillary     Resp: 37 35 36   Height:      Weight:      SpO2: 98% 98% 98%     Intake/Output Summary (Last 24 hours) at 09/30/15 0700 Last data filed at 09/30/15 0600  Gross per 24 hour  Intake 649.68 ml  Output    450 ml  Net 199.68 ml    Telemetry reveals SR this morning  GEN- The patient is well appearing, alert and oriented x 3 today.   Head- normocephalic, atraumatic Eyes-  Sclera clear, conjunctiva pink Ears- hearing intact Oropharynx- clear Neck- supple, no JVP Lungs- Clear to ausculation bilaterally, normal work of breathing Heart- Regular rate and rhythm, 2/6 SM, no rubs or gallops GI- soft, NT, ND Extremities- no clubbing, cyanosis, or edema Skin- no rash or lesion Psych- euthymic mood, full affect Neuro- no gross deficits appreciated  LABS: Basic Metabolic Panel:  Recent Labs  45/40/98 1619 09/30/15 0235  NA 143 140  K 4.1 3.0*  CL 104 105  CO2 23 24  GLUCOSE 101* 95  BUN 14 13  CREATININE 1.69* 1.50*  CALCIUM 10.0 8.9  MG 1.6*  --    Liver Function Tests:  Recent Labs  09/29/15 1619  AST 56*  ALT 44  ALKPHOS 66  BILITOT 0.4  PROT 7.3  ALBUMIN 3.8   CBC:  Recent Labs  09/29/15 1619 09/30/15 0235  WBC 7.6 6.2  NEUTROABS 4.9  --   HGB 15.4 13.8  HCT 46.4 42.4  MCV 92.4 92.2  PLT 183 147*    Cardiac Enzymes:  Recent Labs  09/29/15 1619  TROPONINI 0.09*   Fasting Lipid Panel:  Recent Labs  09/30/15 0235  CHOL 182  HDL 46  LDLCALC 102*  TRIG 169*  CHOLHDL 4.0   Thyroid Function Tests:  Recent Labs  09/29/15 1918  TSH 1.205    RADIOLOGY: Dg Chest Portable 1 View 09/29/2015  CLINICAL DATA:  Defibrillator firing. History of cardiomyopathy, atrial fibrillation and hypertension. EXAM: PORTABLE CHEST 1 VIEW COMPARISON:  08/24/2014. FINDINGS: 1638 hours. There are significantly lower lung volumes. The left subclavian AICD leads appear unchanged within the right atrium and right ventricle. The heart size and mediastinal contours are stable for the degree of inspiration. There is bibasilar atelectasis with possible mild pulmonary edema. No confluent airspace opacity or significant pleural effusion seen. IMPRESSION: Low lung volumes with bibasilar atelectasis and possible mild edema. Electronically Signed   By: Carey Bullocks M.D.   On: 09/29/2015 17:31    ASSESSMENT AND PLAN:   1. ICD shock     New onset PAFib RVR     On Cardizem PO, amio  gtt     CHADS2Vasc is at least  2 started on Xarelto     Converted to SR early this morning, will change to PO amiodarone   2. CM     Echo is pending     Records from [facility] pending     The patient recalls being told he had a "thick heart" was being evaluated for recurrent syncope at that time prior to his device implantation.  He denies ongoing syncope since then +/- 2-3 years  3. HTN     His home diltiazem was up-titrated yesterday     Some improvement, follow  4. Hypokalemia     Repeat stat            Francis Dowse, PA-C 09/30/2015 7:00 AM   He recognizes the term Hypertrophic Cardiomyopathy   Will presume Have discussed with him the need for genetic testing and evaluation   He will reach out to [facility] re this Device reprogrammed K low  Will replete and will begin aldactone,  Have reviewed the need for careful  surveillance of K  But hopefully will address both low potassium, apparently a recurrent issue and hypertension  Will recheck K priot to discharge amiod 400 BID  X 2 weeks

## 2015-10-01 LAB — HEMOGLOBIN A1C
HEMOGLOBIN A1C: 5.4 % (ref 4.8–5.6)
MEAN PLASMA GLUCOSE: 108 mg/dL

## 2016-08-24 ENCOUNTER — Ambulatory Visit (INDEPENDENT_AMBULATORY_CARE_PROVIDER_SITE_OTHER): Payer: Medicare Other

## 2016-08-24 ENCOUNTER — Encounter (HOSPITAL_COMMUNITY): Payer: Self-pay | Admitting: Emergency Medicine

## 2016-08-24 ENCOUNTER — Ambulatory Visit (HOSPITAL_COMMUNITY)
Admission: EM | Admit: 2016-08-24 | Discharge: 2016-08-24 | Disposition: A | Discharge status: Home / Self Care | Payer: Medicare Other | Attending: Emergency Medicine | Admitting: Emergency Medicine

## 2016-08-24 DIAGNOSIS — R112 Nausea with vomiting, unspecified: Secondary | ICD-10-CM

## 2016-08-24 DIAGNOSIS — B349 Viral infection, unspecified: Secondary | ICD-10-CM | POA: Diagnosis not present

## 2016-08-24 MED ORDER — ONDANSETRON 4 MG PO TBDP
4.0000 mg | ORAL_TABLET | Freq: Once | ORAL | Status: AC
  Administered 2016-08-24: 4 mg via ORAL

## 2016-08-24 MED ORDER — ONDANSETRON 4 MG PO TBDP
ORAL_TABLET | ORAL | Status: AC
  Filled 2016-08-24: qty 1

## 2016-08-24 MED ORDER — ONDANSETRON HCL 4 MG PO TABS: 4.0000 mg | ORAL_TABLET | Freq: Three times a day (TID) | ORAL | 0 refills | Status: DC | PRN

## 2016-08-24 NOTE — ED Provider Notes (Signed)
[facility]    CSN: [id number] Arrival date & time: 08/24/16  1219     History   Chief Complaint Chief Complaint  Patient presents with  . Nausea    HPI Glenn [name] is a 51 y.o. male.   HPI He is a 51 year old man here for evaluation of vomiting. He reports feeling poorly for about a week. He has had upper respiratory symptoms including cough and congestion. He also reports hot and cold chills, but no documented temperature. This is associated with some diarrhea. In the last 3 days, he has developed nausea, and vomiting.  He reports getting short of breath with minimal exertion. He has also been getting dizzy, particularly when he first stands up.  He does have a history of A. fib and has an ICD in place.  Past Medical History:  Diagnosis Date  . A-fib (HCC) 09/29/15  . Cardiomyopathy (HCC)   . Dysrhythmia   . Enlarged prostate   . Gout   . Hypertension     Patient Active Problem List   Diagnosis Date Noted  . Atrial fibrillation with RVR (HCC) 09/29/2015  . Atrial fibrillation (HCC) 09/29/2015  . Paroxysmal atrial fibrillation (HCC)   . 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 Essential hypertension     Past Surgical History:  Procedure Laterality Date  . CARDIAC DEFIBRILLATOR PLACEMENT  2013   Medtronic Evera dual chamber ICD [facility]       Home Medications    Prior to Admission medications   Medication Sig Start Date End Date Taking? Authorizing Provider  apixaban (ELIQUIS) 5 MG TABS tablet Take 5 mg by mouth 2 (two) times daily.   Yes Historical Provider, MD  atorvastatin (LIPITOR) 80 MG tablet Take 80 mg by mouth daily.   Yes Historical Provider, MD  buPROPion (WELLBUTRIN XL) 300 MG 24 hr tablet Take 300 mg by mouth daily.   Yes Historical Provider, MD  carvedilol (COREG) 25 MG tablet Take 25 mg by mouth 2 (two) times daily with a meal.   Yes Historical Provider, MD    cholecalciferol (VITAMIN D) 1000 UNITS tablet Take 1,000 Units by mouth daily.   Yes Historical Provider, MD  diltiazem (CARDIZEM CD) 240 MG 24 hr capsule Take 1 capsule (240 mg total) by mouth daily. 09/30/15  Yes Renee Norberto SorensonLynn Ursuy, PA-C  loperamide (IMODIUM) 2 MG capsule Take 2 mg by mouth as needed for diarrhea or loose stools (q4 hours as needed for diarrhea).   Yes Historical Provider, MD  losartan (COZAAR) 100 MG tablet Take 100 mg by mouth daily.   Yes Historical Provider, MD  magnesium oxide (MAG-OX) 400 MG tablet Take 400 mg by mouth daily.   Yes Historical Provider, MD  omeprazole (PRILOSEC) 20 MG capsule Take 20 mg by mouth daily.   Yes Historical Provider, MD  spironolactone (ALDACTONE) 25 MG tablet Take 0.5 tablets (12.5 mg total) by mouth daily. 09/30/15  Yes Renee Norberto SorensonLynn Ursuy, PA-C  terazosin (HYTRIN) 5 MG capsule Take 10 mg by mouth at bedtime.    Yes Historical Provider, MD  terbinafine (LAMISIL) 1 % cream Apply 1 application topically 2 (two) times daily.   Yes Historical Provider, MD  ondansetron (ZOFRAN) 4 MG tablet Take 1 tablet (4 mg total) by mouth every 8 (eight) hours as needed for nausea or vomiting. 08/24/16   Charm RingsErin J Sadonna Kotara, MD    Family History Family History  Problem Relation Age  of Onset  . Diabetes Mother   . Hypertension Mother   . Hypertension Father     Social History Social History  Substance Use Topics  . Smoking status: Never Smoker  . Smokeless tobacco: Not on file  . Alcohol use Yes     Comment: weekend use     Allergies   Ace inhibitors and Penicillins   Review of Systems Review of Systems As in history of present illness  Physical Exam Triage Vital Signs ED Triage Vitals  Enc Vitals Group     BP 08/24/16 1306 99/75     Pulse Rate 08/24/16 1306 81     Resp 08/24/16 1306 18     Temp 08/24/16 1306 98.1 F (36.7 C)     Temp Source 08/24/16 1306 Oral     SpO2 08/24/16 1306 100 %     Weight --      Height --      Head Circumference --       Peak Flow --      Pain Score 08/24/16 1305 7     Pain Loc --      Pain Edu? --      Excl. in GC? --    Orthostatic VS for the past 24 hrs:  BP- Lying Pulse- Lying BP- Sitting Pulse- Sitting BP- Standing at 0 minutes Pulse- Standing at 0 minutes  08/24/16 1318 118/63 83 105/67 92 101/57 104    Updated Vital Signs BP 99/75 (BP Location: Left Arm)   Pulse 81   Temp 98.1 F (36.7 C) (Oral)   Resp 18   SpO2 100%   Visual Acuity Right Eye Distance:   Left Eye Distance:   Bilateral Distance:    Right Eye Near:   Left Eye Near:    Bilateral Near:     Physical Exam  Constitutional: He is oriented to person, place, and time. He appears well-developed and well-nourished. No distress.  Neck: Neck supple.  Cardiovascular: Normal rate, regular rhythm and normal heart sounds.   No murmur heard. Pulmonary/Chest: Effort normal and breath sounds normal. No respiratory distress. He has no wheezes. He has no rales.  Abdominal: Soft. There is no tenderness.  Neurological: He is alert and oriented to person, place, and time.     UC Treatments / Results  Labs (all labs ordered are listed, but only abnormal results are displayed) Labs Reviewed - No data to display  EKG  EKG Interpretation None       Radiology Dg Chest 2 View  Result Date: 08/24/2016 CLINICAL DATA:  Cough, congestion, dizziness, and fever for 1 week, history hypertension, atrial fibrillation, cardiomyopathy, prior CABG EXAM: CHEST  2 VIEW COMPARISON:  09/29/2015 FINDINGS: LEFT subclavian AICD with leads projecting at RIGHT atrium and RIGHT ventricle. Normal heart size, mediastinal contours, and pulmonary vascularity. Lungs clear. No pleural effusion or pneumothorax. Bones unremarkable. IMPRESSION: No acute abnormalities. Electronically Signed   By: Ulyses Southward M.D.   On: 08/24/2016 14:02    Procedures ED EKG Date/Time: 08/24/2016 2:35 PM Performed by: Charm Rings Authorized by: Charm Rings   ECG reviewed  by ED Physician in the absence of a cardiologist: yes   Previous ECG:    Previous ECG:  Compared to current   Comparison ECG info:  Improved   Similarity:  Changes noted Interpretation:    Interpretation: abnormal   Rate:    ECG rate:  87   ECG rate assessment: normal   Rhythm:  Rhythm: sinus rhythm   Ectopy:    Ectopy: none   QRS:    QRS axis:  Left   QRS intervals:  Normal Conduction:    Conduction: normal   ST segments:    ST segments:  Non-specific T waves:    T waves: non-specific and inverted     Inverted:  I, aVL, V3, V4, V5 and V6 Comments:     NSR; t-wave inversion in anterolateral leads; overall improved from prior ekg   (including critical care time)  Medications Ordered in UC Medications  ondansetron (ZOFRAN-ODT) disintegrating tablet 4 mg (4 mg Oral Given 08/24/16 1353)     Initial Impression / Assessment and Plan / UC Course  I have reviewed the triage vital signs and the nursing notes.  Pertinent labs & imaging results that were available during my care of the patient were reviewed by me and considered in my medical decision making (see chart for details).  Clinical Course     Patient reports feeling better after the Zofran. X-ray negative. EKG without acute changes. Symptoms likely secondary to viral illness. Treat symptomatically with Zofran as needed. Emphasized importance of rest and fluids. Return precautions reviewed.  Final Clinical Impressions(s) / UC Diagnoses   Final diagnoses:  Viral syndrome  Non-intractable vomiting with nausea, unspecified vomiting type    New Prescriptions New Prescriptions   ONDANSETRON (ZOFRAN) 4 MG TABLET    Take 1 tablet (4 mg total) by mouth every 8 (eight) hours as needed for nausea or vomiting.     Charm Rings, MD 08/24/16 [phone]

## 2016-08-24 NOTE — ED Notes (Signed)
Patient's BP and acuity level reported to A.DentistBowles RN.

## 2016-08-24 NOTE — Discharge Instructions (Signed)
Your x-ray is normal. Your EKG does not have any sign of acute cardiac problem. This is all likely coming from a virus. Use Zofran every 8 hours as needed for nausea or vomiting. Make sure you are drinking lots of fluids and getting plenty of rest. If your shortness of breath is not improving or you develop chest pain or pressure, please go to the emergency room.

## 2016-08-24 NOTE — ED Triage Notes (Signed)
[facility]/V that started 3 days.

## 2016-12-26 ENCOUNTER — Ambulatory Visit (HOSPITAL_COMMUNITY)
Admission: EM | Admit: 2016-12-26 | Discharge: 2016-12-26 | Disposition: A | Discharge status: Home / Self Care | Payer: Medicare Other | Attending: Internal Medicine | Admitting: Internal Medicine

## 2016-12-26 ENCOUNTER — Encounter (HOSPITAL_COMMUNITY): Payer: Self-pay | Admitting: Emergency Medicine

## 2016-12-26 DIAGNOSIS — M109 Gout, unspecified: Secondary | ICD-10-CM | POA: Insufficient documentation

## 2016-12-26 DIAGNOSIS — Z7901 Long term (current) use of anticoagulants: Secondary | ICD-10-CM | POA: Diagnosis not present

## 2016-12-26 DIAGNOSIS — Z87442 Personal history of urinary calculi: Secondary | ICD-10-CM | POA: Diagnosis not present

## 2016-12-26 DIAGNOSIS — Z888 Allergy status to other drugs, medicaments and biological substances status: Secondary | ICD-10-CM | POA: Insufficient documentation

## 2016-12-26 DIAGNOSIS — Z95810 Presence of automatic (implantable) cardiac defibrillator: Secondary | ICD-10-CM | POA: Diagnosis not present

## 2016-12-26 DIAGNOSIS — R310 Gross hematuria: Secondary | ICD-10-CM | POA: Diagnosis not present

## 2016-12-26 DIAGNOSIS — Z880 Allergy status to penicillin: Secondary | ICD-10-CM | POA: Diagnosis not present

## 2016-12-26 DIAGNOSIS — N400 Enlarged prostate without lower urinary tract symptoms: Secondary | ICD-10-CM | POA: Diagnosis not present

## 2016-12-26 DIAGNOSIS — R319 Hematuria, unspecified: Secondary | ICD-10-CM | POA: Diagnosis present

## 2016-12-26 DIAGNOSIS — Z833 Family history of diabetes mellitus: Secondary | ICD-10-CM | POA: Insufficient documentation

## 2016-12-26 DIAGNOSIS — Z79899 Other long term (current) drug therapy: Secondary | ICD-10-CM | POA: Diagnosis not present

## 2016-12-26 DIAGNOSIS — I480 Paroxysmal atrial fibrillation: Secondary | ICD-10-CM | POA: Diagnosis not present

## 2016-12-26 DIAGNOSIS — Z8249 Family history of ischemic heart disease and other diseases of the circulatory system: Secondary | ICD-10-CM | POA: Insufficient documentation

## 2016-12-26 DIAGNOSIS — I429 Cardiomyopathy, unspecified: Secondary | ICD-10-CM | POA: Insufficient documentation

## 2016-12-26 DIAGNOSIS — I10 Essential (primary) hypertension: Secondary | ICD-10-CM | POA: Diagnosis not present

## 2016-12-26 LAB — POCT URINALYSIS DIP (DEVICE)
Glucose, UA: NEGATIVE mg/dL
Ketones, ur: NEGATIVE mg/dL
LEUKOCYTES UA: NEGATIVE
NITRITE: NEGATIVE
PH: 5.5 (ref 5.0–8.0)
SPECIFIC GRAVITY, URINE: 1.02 (ref 1.005–1.030)
UROBILINOGEN UA: 0.2 mg/dL (ref 0.0–1.0)

## 2016-12-26 NOTE — Discharge Instructions (Addendum)
History suggests kidney stone passage may be the source of the blood in your urine.  Urine culture is pending to rule out infection.  The urgent care will contact you if further treatment is needed.  Push fluids and maybe increase lemonade too, if this was recommended for previous kidney stones.  [facility] if [facility]uria persists for >3-4 more days.

## 2016-12-26 NOTE — ED Notes (Signed)
Clean & dirty urine specimen in lab

## 2016-12-26 NOTE — ED Triage Notes (Signed)
Here for hematuria onset 5 days... Reports it subsided for a couple of days but restarted this morning  Sx also include: urinary freq/urgency, chills, back pain  Denies fevers, n/v/d  A&O x4... NAD

## 2016-12-26 NOTE — ED Provider Notes (Signed)
[facility]    CSN: [id number] Arrival date & time: 12/26/16  1418     History   Chief Complaint Chief Complaint  Patient presents with  . Hematuria    HPI Glenn [name] is a 51 y.o. male. He presents today with gross hematuria, onset this morning. He had an episode several days ago that lasted for about a day and then resolved. He had shaking chills and bilateral low back discomfort with the first episode, also now resolved. Today, he has a lot of urinary frequency, no dysuria. At some discomfort radiating to the left scrotum as well. Not vomiting, no change in bowels. [facility], and was told to drink lemonade to help them pass.   HPI  Past Medical History:  Diagnosis Date  . A-fib (HCC) 09/29/15  . Cardiomyopathy (HCC)   . Dysrhythmia   . Enlarged prostate   . Gout   . Hypertension     Patient Active Problem List   Diagnosis Date Noted  . Atrial fibrillation with RVR (HCC) 09/29/2015  . Atrial fibrillation (HCC) 09/29/2015  . Paroxysmal atrial fibrillation (HCC)   . 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 Essential hypertension     Past Surgical History:  Procedure Laterality Date  . CARDIAC DEFIBRILLATOR PLACEMENT  2013   Medtronic Evera dual chamber ICD [facility]       Home Medications    Prior to Admission medications   Medication Sig Start Date End Date Taking? Authorizing Provider  apixaban (ELIQUIS) 5 MG TABS tablet Take 5 mg by mouth 2 (two) times daily.   Yes [provider]  atorvastatin (LIPITOR) 80 MG tablet Take 80 mg by mouth daily.   Yes [provider]  buPROPion (WELLBUTRIN XL) 300 MG 24 hr tablet Take 300 mg by mouth daily.   Yes [provider]  carvedilol (COREG) 25 MG tablet Take 25 mg by mouth 2 (two) times daily with a meal.   Yes [provider]  cholecalciferol (VITAMIN D) 1000  UNITS tablet Take 1,000 Units by mouth daily.   Yes [provider]  diltiazem (CARDIZEM CD) 240 MG 24 hr capsule Take 1 capsule (240 mg total) by mouth daily. 09/30/15  Yes Sheilah PigeonUrsuy, Renee Lynn, PA-C  losartan (COZAAR) 100 MG tablet Take 100 mg by mouth daily.   Yes [provider]  magnesium oxide (MAG-OX) 400 MG tablet Take 400 mg by mouth daily.   Yes [provider]  omeprazole (PRILOSEC) 20 MG capsule Take 20 mg by mouth daily.   Yes [provider]  spironolactone (ALDACTONE) 25 MG tablet Take 0.5 tablets (12.5 mg total) by mouth daily. 09/30/15  Yes Sheilah PigeonUrsuy, Renee Lynn, PA-C  terazosin (HYTRIN) 5 MG capsule Take 10 mg by mouth at bedtime.    Yes [provider]  loperamide (IMODIUM) 2 MG capsule Take 2 mg by mouth as needed for diarrhea or loose stools (q4 hours as needed for diarrhea).    [provider]  terbinafine (LAMISIL) 1 % cream Apply 1 application topically 2 (two) times daily.    [provider]    Family History Family History  Problem Relation Age of Onset  . Diabetes Mother   . Hypertension Mother   . Hypertension Father     Social History Social History  Substance Use Topics  . Smoking status: Never Smoker  . Smokeless tobacco: Never  Used  . Alcohol use Yes     Comment: weekend use     Allergies   Ace inhibitors and Penicillins   Review of Systems Review of Systems  All other systems reviewed and are negative.    Physical Exam Triage Vital Signs ED Triage Vitals [12/26/16 1454]  Enc Vitals Group     BP 115/74     Pulse Rate 77     Resp 20     Temp 97.9 F (36.6 C)     Temp Source Oral     SpO2 100 %     Weight      Height      Pain Score      Pain Loc    Updated Vital Signs BP 115/74 (BP Location: Right Arm)   Pulse 77   Temp 97.9 F (36.6 C) (Oral)   Resp 20   SpO2 100%   Physical Exam  Constitutional: He is oriented to person, place, and time. No distress.  Alert, nicely  groomed  HENT:  Head: Atraumatic.  Eyes:  Conjugate gaze, no eye redness/drainage  Neck: Neck supple.  Cardiovascular: Normal rate and regular rhythm.   ICD palpable in the left upper chest  Pulmonary/Chest: No respiratory distress. He has no wheezes. He has no rales.  Lungs clear, symmetric breath sounds  Abdominal: He exhibits no distension.  Musculoskeletal: Normal range of motion. He exhibits no edema.  Neurological: He is alert and oriented to person, place, and time.  Skin: Skin is warm and dry.  No cyanosis  Nursing note and vitals reviewed.    UC Treatments / Results  Labs Results for orders placed or performed during the hospital encounter of 12/26/16  Urine culture  Result Value Ref Range   Specimen Description URINE, RANDOM    Special Requests NONE    Culture NO GROWTH    Report Status 12/28/2016 FINAL   POCT urinalysis dip (device)  Result Value Ref Range   Glucose, UA NEGATIVE NEGATIVE mg/dL   Bilirubin Urine SMALL (A) NEGATIVE   Ketones, ur NEGATIVE NEGATIVE mg/dL   Specific Gravity, Urine 1.020 1.005 - 1.030   Hgb urine dipstick LARGE (A) NEGATIVE   pH 5.5 5.0 - 8.0   Protein, ur >=300 (A) NEGATIVE mg/dL   Urobilinogen, UA 0.2 0.0 - 1.0 mg/dL   Nitrite NEGATIVE NEGATIVE   Leukocytes, UA NEGATIVE NEGATIVE    Procedures Procedures (including critical care time) None today  Final Clinical Impressions(s) / UC Diagnoses   Final diagnoses:  Gross hematuria   History suggests kidney stone passage may be the source of the blood in your urine.  Urine culture is pending to rule out infection.  The urgent care will contact you if further treatment is needed.  Push fluids and maybe increase lemonade too, if this was recommended for previous kidney stones.  [facility] if hematuria persists for >3-4 more days.      Eustace Moore, MD 12/29/16 [phone]

## 2016-12-28 LAB — URINE CULTURE: Culture: NO GROWTH

## 2018-07-01 ENCOUNTER — Other Ambulatory Visit: Payer: Self-pay

## 2018-07-01 ENCOUNTER — Encounter (HOSPITAL_COMMUNITY): Payer: Self-pay

## 2018-07-01 ENCOUNTER — Emergency Department (HOSPITAL_COMMUNITY): Payer: Non-veteran care

## 2018-07-01 ENCOUNTER — Emergency Department (HOSPITAL_COMMUNITY)
Admission: EM | Admit: 2018-07-01 | Discharge: 2018-07-01 | Disposition: A | Discharge status: Home / Self Care | Payer: Non-veteran care | Attending: Emergency Medicine | Admitting: Emergency Medicine

## 2018-07-01 DIAGNOSIS — I10 Essential (primary) hypertension: Secondary | ICD-10-CM | POA: Diagnosis not present

## 2018-07-01 DIAGNOSIS — N200 Calculus of kidney: Secondary | ICD-10-CM | POA: Insufficient documentation

## 2018-07-01 DIAGNOSIS — N3001 Acute cystitis with hematuria: Secondary | ICD-10-CM | POA: Diagnosis not present

## 2018-07-01 DIAGNOSIS — Z79899 Other long term (current) drug therapy: Secondary | ICD-10-CM | POA: Insufficient documentation

## 2018-07-01 DIAGNOSIS — Z950 Presence of cardiac pacemaker: Secondary | ICD-10-CM | POA: Diagnosis not present

## 2018-07-01 DIAGNOSIS — Z7901 Long term (current) use of anticoagulants: Secondary | ICD-10-CM | POA: Insufficient documentation

## 2018-07-01 DIAGNOSIS — R109 Unspecified abdominal pain: Secondary | ICD-10-CM | POA: Diagnosis present

## 2018-07-01 LAB — CBC WITH DIFFERENTIAL/PLATELET
Abs Immature Granulocytes: 0.01 10*3/uL (ref 0.00–0.07)
BASOS ABS: 0 10*3/uL (ref 0.0–0.1)
BASOS PCT: 0 %
EOS ABS: 0.1 10*3/uL (ref 0.0–0.5)
EOS PCT: 1 %
HCT: 44.8 % (ref 39.0–52.0)
Hemoglobin: 14.6 g/dL (ref 13.0–17.0)
Immature Granulocytes: 0 %
Lymphocytes Relative: 20 %
Lymphs Abs: 1.4 10*3/uL (ref 0.7–4.0)
MCH: 29.9 pg (ref 26.0–34.0)
MCHC: 32.6 g/dL (ref 30.0–36.0)
MCV: 91.6 fL (ref 80.0–100.0)
MONO ABS: 0.8 10*3/uL (ref 0.1–1.0)
Monocytes Relative: 12 %
NRBC: 0 % (ref 0.0–0.2)
Neutro Abs: 4.5 10*3/uL (ref 1.7–7.7)
Neutrophils Relative %: 67 %
Platelets: 189 10*3/uL (ref 150–400)
RBC: 4.89 MIL/uL (ref 4.22–5.81)
RDW: 12.8 % (ref 11.5–15.5)
WBC: 6.7 10*3/uL (ref 4.0–10.5)

## 2018-07-01 LAB — COMPREHENSIVE METABOLIC PANEL
ALK PHOS: 71 U/L (ref 38–126)
ALT: 26 U/L (ref 0–44)
ANION GAP: 7 (ref 5–15)
AST: 26 U/L (ref 15–41)
Albumin: 3.9 g/dL (ref 3.5–5.0)
BUN: 16 mg/dL (ref 6–20)
CALCIUM: 9.2 mg/dL (ref 8.9–10.3)
CO2: 23 mmol/L (ref 22–32)
Chloride: 109 mmol/L (ref 98–111)
Creatinine, Ser: 1.84 mg/dL — ABNORMAL HIGH (ref 0.61–1.24)
GFR, EST AFRICAN AMERICAN: 46 mL/min — AB (ref >60)
GFR, EST NON AFRICAN AMERICAN: 40 mL/min — AB (ref >60)
Glucose, Bld: 91 mg/dL (ref 70–99)
Potassium: 3.8 mmol/L (ref 3.5–5.1)
SODIUM: 139 mmol/L (ref 135–145)
Total Bilirubin: 1.3 mg/dL — ABNORMAL HIGH (ref 0.3–1.2)
Total Protein: 7.6 g/dL (ref 6.5–8.1)

## 2018-07-01 LAB — LIPASE, BLOOD: LIPASE: 60 U/L — AB (ref 11–51)

## 2018-07-01 LAB — URINALYSIS, ROUTINE W REFLEX MICROSCOPIC
BILIRUBIN URINE: NEGATIVE
Glucose, UA: NEGATIVE mg/dL
Ketones, ur: NEGATIVE mg/dL
Nitrite: NEGATIVE
PROTEIN: 30 mg/dL — AB
SPECIFIC GRAVITY, URINE: 1.023 (ref 1.005–1.030)
pH: 5 (ref 5.0–8.0)

## 2018-07-01 LAB — PROTIME-INR
INR: 1.18
PROTHROMBIN TIME: 14.9 s (ref 11.4–15.2)

## 2018-07-01 MED ORDER — ONDANSETRON 4 MG PO TBDP: 4.0000 mg | ORAL_TABLET | ORAL | 0 refills | Status: DC | PRN

## 2018-07-01 MED ORDER — SODIUM CHLORIDE 0.9 % IV SOLN
Freq: Once | INTRAVENOUS | Status: AC
  Administered 2018-07-01: 11:00:00 via INTRAVENOUS

## 2018-07-01 MED ORDER — HYDROMORPHONE HCL 1 MG/ML IJ SOLN
1.0000 mg | Freq: Once | INTRAMUSCULAR | Status: AC
  Administered 2018-07-01: 1 mg via INTRAVENOUS
  Filled 2018-07-01: qty 1

## 2018-07-01 MED ORDER — OXYCODONE-ACETAMINOPHEN 5-325 MG PO TABS: 2.0000 | ORAL_TABLET | ORAL | 0 refills | Status: DC | PRN

## 2018-07-01 MED ORDER — ONDANSETRON HCL 4 MG/2ML IJ SOLN
4.0000 mg | Freq: Once | INTRAMUSCULAR | Status: AC
  Administered 2018-07-01: 4 mg via INTRAVENOUS
  Filled 2018-07-01: qty 2

## 2018-07-01 MED ORDER — LEVOFLOXACIN 750 MG PO TABS: 750.0000 mg | ORAL_TABLET | Freq: Every day | ORAL | 0 refills | Status: DC

## 2018-07-01 MED ORDER — TAMSULOSIN HCL 0.4 MG PO CAPS: 0.4000 mg | ORAL_CAPSULE | Freq: Every day | ORAL | 0 refills | Status: DC

## 2018-07-01 NOTE — Discharge Instructions (Addendum)
1.  You appear to have already passed your kidney stone.  Take pain medications as needed.  Use Flomax for the next week.  Your urine tests trace positive for infection.  Take Levaquin as prescribed. 2.  You can follow-up with your family doctor this week.  You have also been given the number for [facility].  If you are continuing to have any pain or blood in the urine, call them for a follow-up appointment. 3.  Return to the emergency department if you develop fever, persisting or worsening pain or other concerning symptoms.

## 2018-07-01 NOTE — ED Provider Notes (Signed)
[facility] EMERGENCY DEPARTMENT Provider Note   CSN: [id number] Arrival date & time: 07/01/18  1052     History   Chief Complaint Chief Complaint  Patient presents with  . Emesis    HPI Glenn [name] is a 52 y.o. male.  HPI Patient reports that he developed pain this morning.  It was fairly sudden onset.  He reports that he felt fine when he went to sleep last night.  He has had some snack with some chips in the morning.  He reports he suddenly got a lot of pain in the right side of his abdomen.  It then felt like he had severe pain in his testicle.  It felt like someone was squeezing it really hard.  Pain slightly wraps around to his back.  He denies he had any pain or burning with urination.  He has not seen any blood in the urine.  He reports after the pain started he was started having some dry heaves but not actually vomiting up any food material.  He reports he had a couple of bowel movements been soft.  He reports that the pain is severe and nothing makes it any better.  Patient has medical history of pacemaker.  No history of kidney stones.  History of an enlarged prostate.  Patient reports that he had a fairly recent cystoscopy to see how his prostate was doing. Past Medical History:  Diagnosis Date  . A-fib (HCC) 09/29/15  . Cardiomyopathy (HCC)   . Dysrhythmia   . Enlarged prostate   . Gout   . Hypertension     Patient Active Problem List   Diagnosis Date Noted  . Atrial fibrillation with RVR (HCC) 09/29/2015  . Atrial fibrillation (HCC) 09/29/2015  . Paroxysmal atrial fibrillation (HCC)   . 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 Essential hypertension     Past Surgical History:  Procedure Laterality Date  . CARDIAC DEFIBRILLATOR PLACEMENT  2013   Medtronic Evera dual chamber ICD implanted  by Dr Wendi Maya at [facility] Medications    Prior to Admission medications   Medication Sig Start  Date End Date Taking? Authorizing Provider  apixaban (ELIQUIS) 5 MG TABS tablet Take 5 mg by mouth 2 (two) times daily.    [provider]  atorvastatin (LIPITOR) 80 MG tablet Take 80 mg by mouth daily.    [provider]  buPROPion (WELLBUTRIN XL) 300 MG 24 hr tablet Take 300 mg by mouth daily.    [provider]  carvedilol (COREG) 25 MG tablet Take 25 mg by mouth 2 (two) times daily with a meal.    [provider]  cholecalciferol (VITAMIN D) 1000 UNITS tablet Take 1,000 Units by mouth daily.    [provider]  diltiazem (CARDIZEM CD) 240 MG 24 hr capsule Take 1 capsule (240 mg total) by mouth daily. 09/30/15   Sheilah Pigeon, PA-C  levofloxacin (LEVAQUIN) 750 MG tablet Take 1 tablet (750 mg total) by mouth daily. X 7 days 07/01/18   Arby Barrette, MD  loperamide (IMODIUM) 2 MG capsule Take 2 mg by mouth as needed for diarrhea or loose stools (q4 hours as needed for diarrhea).    [provider]  losartan (COZAAR) 100 MG tablet Take 100 mg by mouth daily.    [provider]  magnesium oxide (MAG-OX) 400 MG tablet Take 400 mg by mouth daily.  [provider]  omeprazole (PRILOSEC) 20 MG capsule Take 20 mg by mouth daily.    [provider]  ondansetron (ZOFRAN ODT) 4 MG disintegrating tablet Take 1 tablet (4 mg total) by mouth every 4 (four) hours as needed for nausea or vomiting. 07/01/18   Arby BarrettePfeiffer, Kanijah Groseclose, MD  oxyCODONE-acetaminophen (PERCOCET) 5-325 MG tablet Take 2 tablets by mouth every 4 (four) hours as needed. 07/01/18   Arby BarrettePfeiffer, Kilie Rund, MD  spironolactone (ALDACTONE) 25 MG tablet Take 0.5 tablets (12.5 mg total) by mouth daily. 09/30/15   Sheilah PigeonUrsuy, Renee Lynn, PA-C  tamsulosin (FLOMAX) 0.4 MG CAPS capsule Take 1 capsule (0.4 mg total) by mouth daily. 07/01/18   Arby BarrettePfeiffer, Sonam Wandel, MD  terazosin (HYTRIN) 5 MG capsule Take 10 mg by mouth at bedtime.     [provider]  terbinafine (LAMISIL) 1 % cream  Apply 1 application topically 2 (two) times daily.    [provider]    Family History Family History  Problem Relation Age of Onset  . Diabetes Mother   . Hypertension Mother   . Hypertension Father     Social History Social History   Tobacco Use  . Smoking status: Never Smoker  . Smokeless tobacco: Never Used  Substance Use Topics  . Alcohol use: Yes    Comment: weekend use  . Drug use: No     Allergies   Ace inhibitors and Penicillins   Review of Systems Review of Systems 10 Systems reviewed and are negative for acute change except as noted in the HPI.   Physical Exam Updated Vital Signs BP (!) 142/87   Pulse 73   Temp (!) 97.4 F (36.3 C) (Oral) Comment: Simultaneous filing. User may not have seen previous data. Comment (Src): Simultaneous filing. User may not have seen previous data.  Resp (!) 27   Ht 6' (1.829 m)   Wt 111.1 kg   SpO2 97%   BMI 33.23 kg/m   Physical Exam  Constitutional: He is oriented to person, place, and time.  Patient is alert and nontoxic.  No respiratory distress.  He appears to be in moderate to severe pain.  HENT:  Head: Normocephalic and atraumatic.  Eyes: EOM are normal.  Cardiovascular: Normal rate, regular rhythm, normal heart sounds and intact distal pulses.  Pulmonary/Chest: Effort normal and breath sounds normal.  Abdominal: Soft. Bowel sounds are normal. He exhibits no distension. There is no tenderness. There is no guarding.  Abdominal pain is not reproducible.  Genitourinary:  Genitourinary Comments: Penis is normal.  No scrotal enlargement.  Testicles are nontender and smooth.  Patient reports his right testicle hurts but palpation does not exacerbate it.  Musculoskeletal: Normal range of motion. He exhibits no edema or tenderness.  Neurological: He is alert and oriented to person, place, and time. He exhibits normal muscle tone. Coordination normal.  Skin: Skin is warm and dry.  Psychiatric: He has a  normal mood and affect.     ED Treatments / Results  Labs (all labs ordered are listed, but only abnormal results are displayed) Labs Reviewed  COMPREHENSIVE METABOLIC PANEL - Abnormal; Notable for the following components:      Result Value   Creatinine, Ser 1.84 (*)    Total Bilirubin 1.3 (*)    GFR calc non Af Amer 40 (*)    GFR calc Af Amer 46 (*)    All other components within normal limits  LIPASE, BLOOD - Abnormal; Notable for the following components:   Lipase  60 (*)    All other components within normal limits  URINALYSIS, ROUTINE W REFLEX MICROSCOPIC - Abnormal; Notable for the following components:   Hgb urine dipstick LARGE (*)    Protein, ur 30 (*)    Leukocytes, UA TRACE (*)    Bacteria, UA RARE (*)    All other components within normal limits  CBC WITH DIFFERENTIAL/PLATELET  PROTIME-INR    EKG None  Radiology Ct Renal Stone Study  Result Date: 07/01/2018 CLINICAL DATA:  Nausea and vomiting with right-sided flank pain EXAM: CT ABDOMEN AND PELVIS WITHOUT CONTRAST TECHNIQUE: Multidetector CT imaging of the abdomen and pelvis was performed following the standard protocol without IV contrast. COMPARISON:  08/13/2010 FINDINGS: Lower chest: No acute abnormality. Hepatobiliary: No focal liver abnormality is seen. No gallstones, gallbladder wall thickening, or biliary dilatation. Pancreas: Unremarkable. No pancreatic ductal dilatation or surrounding inflammatory changes. Spleen: Normal in size without focal abnormality. Adrenals/Urinary Tract: Adrenal glands are within normal limits. Left kidney is well visualized. No renal calculi or obstructive changes are noted. The right kidney demonstrates no renal calculi although perinephric stranding is seen as well as hydronephrosis and mild hydroureter which extends to the level of the urinary bladder. No definitive right ureteral stone is seen although a tiny fleck like calcification is noted in the dependent portion of the bladder  on the left which may represent a recently passed stone. The bladder is partially distended. Stomach/Bowel: Stomach is within normal limits. Appendix appears normal. No evidence of bowel wall thickening, distention, or inflammatory changes. Vascular/Lymphatic: Aortic atherosclerosis. No enlarged abdominal or pelvic lymph nodes. Reproductive: Prostate is unremarkable. Other: No abdominal wall hernia or abnormality. No abdominopelvic ascites. Musculoskeletal: No acute or significant osseous findings. IMPRESSION: Perinephric stranding and hydronephrotic changes in the right kidney and ureter consistent with edema from recently passed stone as no definitive obstructing stone is noted. A tiny calculus is noted within the dependent portion of the bladder likely representing the recently passed stone. Electronically Signed   By: Alcide Clever M.D.   On: 07/01/2018 14:12    Procedures Procedures (including critical care time)  Medications Ordered in ED Medications  HYDROmorphone (DILAUDID) injection 1 mg (1 mg Intravenous Given 07/01/18 1123)  ondansetron (ZOFRAN) injection 4 mg (4 mg Intravenous Given 07/01/18 1123)  0.9 %  sodium chloride infusion ( Intravenous New Bag/Given 07/01/18 1122)     Initial Impression / Assessment and Plan / ED Course  I have reviewed the triage vital signs and the nursing notes.  Pertinent labs & imaging results that were available during my care of the patient were reviewed by me and considered in my medical decision making (see chart for details).     CT shows a small stone within the bladder.  No apparent retained stone within the ureter.  Patient's urine test trace positive for UTI.  Will empirically cover with Keflex and culture the urine.  Patient is comfortable at this time pain is resolved.  Discussed follow-up plan first with PCP and potential referral to urology if needed.  Urology contact information provided should have any persisting pain or blood.  Return  precautions reviewed.  Final Clinical Impressions(s) / ED Diagnoses   Final diagnoses:  Kidney stone  Acute cystitis with hematuria    ED Discharge Orders         Ordered    levofloxacin (LEVAQUIN) 750 MG tablet  Daily     07/01/18 1435    tamsulosin (FLOMAX) 0.4 MG CAPS capsule  Daily  07/01/18 1435    oxyCODONE-acetaminophen (PERCOCET) 5-325 MG tablet  Every 4 hours PRN     07/01/18 1435    ondansetron (ZOFRAN ODT) 4 MG disintegrating tablet  Every 4 hours PRN     07/01/18 1435           Arby Barrette, MD 07/01/18 1439

## 2018-07-01 NOTE — ED Triage Notes (Signed)
Pt states woke up this morning with N/V/D sudden onset. Denies fever, chills, sweats, blood in emesis or stool. Ate chicken and dumplings last night and felt fine going to bed. Also c/o "right kidney and testicle hurting like someone is squeezing them".  20/10 pain per pt report

## 2018-07-01 NOTE — ED Notes (Signed)
Urinal given pt obtaining urine at this time.

## 2020-03-21 ENCOUNTER — Emergency Department (HOSPITAL_COMMUNITY)
Admission: EM | Admit: 2020-03-21 | Discharge: 2020-03-21 | Disposition: A | Discharge status: Home / Self Care | Payer: No Typology Code available for payment source | Attending: Emergency Medicine | Admitting: Emergency Medicine

## 2020-03-21 ENCOUNTER — Encounter (HOSPITAL_COMMUNITY): Payer: Self-pay

## 2020-03-21 DIAGNOSIS — R6883 Chills (without fever): Secondary | ICD-10-CM | POA: Diagnosis not present

## 2020-03-21 DIAGNOSIS — I10 Essential (primary) hypertension: Secondary | ICD-10-CM | POA: Insufficient documentation

## 2020-03-21 DIAGNOSIS — M109 Gout, unspecified: Secondary | ICD-10-CM

## 2020-03-21 DIAGNOSIS — M79672 Pain in left foot: Secondary | ICD-10-CM | POA: Diagnosis present

## 2020-03-21 DIAGNOSIS — M10072 Idiopathic gout, left ankle and foot: Secondary | ICD-10-CM | POA: Diagnosis not present

## 2020-03-21 MED ORDER — PREDNISONE 10 MG (21) PO TBPK: ORAL_TABLET | Freq: Every day | ORAL | 0 refills | Status: DC

## 2020-03-21 NOTE — ED Provider Notes (Signed)
[name] EMERGENCY DEPARTMENT Provider Note   CSN: [id number] Arrival date & time: 03/21/20  1030     History Chief Complaint  Patient presents with  . Foot Pain    Glenn [name] is a 54 y.o. male.  HPI   54 year old male with a history of A. fib, cardiomyopathy, enlarged prostate, gout, who presents the emergency department today for evaluation of left foot pain.  States that started several days ago.  He has a history of gout and states this feels the same.  Associated redness and some swelling to the base of the left great toe.  Has had some chills as well but no documented fevers at home.  He notes he has been eating a lot of hotdogs lately and has also been drinking alcohol  Past Medical History:  Diagnosis Date  . A-fib (HCC) 09/29/15  . Cardiomyopathy (HCC)   . Dysrhythmia   . Enlarged prostate   . Gout   . Hypertension     Patient Active Problem List   Diagnosis Date Noted  . Atrial fibrillation with RVR (HCC) 09/29/2015  . Atrial fibrillation (HCC) 09/29/2015  . Paroxysmal atrial fibrillation (HCC)   . 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 Essential hypertension     Past Surgical History:  Procedure Laterality Date  . CARDIAC DEFIBRILLATOR PLACEMENT  2013   Medtronic Evera dual chamber ICD implanted  by Dr Wendi Maya at [facility]       Family History  Problem Relation Age of Onset  . Diabetes Mother   . Hypertension Mother   . Hypertension Father     Social History   Tobacco Use  . Smoking status: Never Smoker  . Smokeless tobacco: Never Used  Substance Use Topics  . Alcohol use: Yes    Comment: weekend use  . Drug use: No    Home Medications Prior to Admission medications   Medication Sig Start Date End Date Taking? Authorizing Provider  apixaban (ELIQUIS) 5 MG TABS tablet Take 5 mg by mouth 2 (two) times daily.    [provider]  atorvastatin (LIPITOR) 80 MG tablet Take 80  mg by mouth daily.    [provider]  buPROPion (WELLBUTRIN XL) 300 MG 24 hr tablet Take 300 mg by mouth daily.    [provider]  carvedilol (COREG) 25 MG tablet Take 25 mg by mouth 2 (two) times daily with a meal.    [provider]  cholecalciferol (VITAMIN D) 1000 UNITS tablet Take 1,000 Units by mouth daily.    [provider]  diltiazem (CARDIZEM CD) 240 MG 24 hr capsule Take 1 capsule (240 mg total) by mouth daily. 09/30/15   Sheilah Pigeon, PA-C  levofloxacin (LEVAQUIN) 750 MG tablet Take 1 tablet (750 mg total) by mouth daily. X 7 days 07/01/18   Arby Barrette, MD  loperamide (IMODIUM) 2 MG capsule Take 2 mg by mouth as needed for diarrhea or loose stools (q4 hours as needed for diarrhea).    [provider]  losartan (COZAAR) 100 MG tablet Take 100 mg by mouth daily.    [provider]  magnesium oxide (MAG-OX) 400 MG tablet Take 400 mg by mouth daily.    [provider]  omeprazole (PRILOSEC) 20 MG capsule Take 20 mg by mouth daily.    [provider]  ondansetron (ZOFRAN ODT) 4 MG disintegrating tablet Take 1 tablet (4 mg total) by mouth every 4 (four) hours  as needed for nausea or vomiting. 07/01/18   Arby Barrette, MD  oxyCODONE-acetaminophen (PERCOCET) 5-325 MG tablet Take 2 tablets by mouth every 4 (four) hours as needed. 07/01/18   Arby Barrette, MD  predniSONE (STERAPRED UNI-PAK 21 TAB) 10 MG (21) TBPK tablet Take by mouth daily. Take 6 tabs by mouth daily  for 2 days, then 5 tabs for 2 days, then 4 tabs for 2 days, then 3 tabs for 2 days, 2 tabs for 2 days, then 1 tab by mouth daily for 2 days 03/21/20   Branden Shallenberger S, PA-C  spironolactone (ALDACTONE) 25 MG tablet Take 0.5 tablets (12.5 mg total) by mouth daily. 09/30/15   Sheilah Pigeon, PA-C  tamsulosin (FLOMAX) 0.4 MG CAPS capsule Take 1 capsule (0.4 mg total) by mouth daily. 07/01/18   Arby Barrette, MD  terazosin (HYTRIN) 5 MG capsule Take  10 mg by mouth at bedtime.     [provider]  terbinafine (LAMISIL) 1 % cream Apply 1 application topically 2 (two) times daily.    [provider]    Allergies    Ace inhibitors and Penicillins  Review of Systems   Review of Systems  Constitutional: Positive for chills. Negative for fever.  Musculoskeletal:       Left foot pain  Skin: Positive for color change.    Physical Exam Updated Vital Signs BP (!) 129/98 (BP Location: Right Arm)   Pulse (!) 108   Temp 99 F (37.2 C) (Oral)   Resp 14   Ht 6' (1.829 m)   Wt 115.2 kg   SpO2 97%   BMI 34.45 kg/m   Physical Exam Constitutional:      General: He is not in acute distress.    Appearance: He is well-developed.  Eyes:     Conjunctiva/sclera: Conjunctivae normal.  Cardiovascular:     Rate and Rhythm: Normal rate.  Pulmonary:     Effort: Pulmonary effort is normal.  Musculoskeletal:     Comments: TTP, swelling, and redness to the base of the 1st great toe on the left foot. Normal sensation, ROB. NVI. No open wounds.   Skin:    General: Skin is warm and dry.  Neurological:     Mental Status: He is alert and oriented to person, place, and time.     ED Results / Procedures / Treatments   Labs (all labs ordered are listed, but only abnormal results are displayed) Labs Reviewed - No data to display  EKG None  Radiology No results found.  Procedures Procedures (including critical care time)  Medications Ordered in ED Medications - No data to display  ED Course  I have reviewed the triage vital signs and the nursing notes.  Pertinent labs & imaging results that were available during my care of the patient were reviewed by me and considered in my medical decision making (see chart for details).    MDM Rules/Calculators/A&P                          Pt presents with monoarticular pain, swelling and erythema.  Pt is afebrile and stable. Doubt septic joint. pt w/o h/o DM. Will tx with  steroid taper.  Discussed that pt should respond to treatment with in 24 hour of begining treatment & likely resolve in 2-3 days. Advised on f/u and return precautions. He voices understanding of the plan and reasons to return. All questions answered, pt stable for discharge.  Final Clinical Impression(s) / ED Diagnoses Final diagnoses:  Acute gout involving toe of left foot, unspecified cause    Rx / DC Orders ED Discharge Orders         Ordered    predniSONE (STERAPRED UNI-PAK 21 TAB) 10 MG (21) TBPK tablet  Daily     Discontinue  Reprint     03/21/20 1446           Karrie Meres, PA-C 03/21/20 1447    Tegeler, Canary Brim, MD 03/21/20 [phone]

## 2020-03-21 NOTE — ED Notes (Signed)
Patient verbalizes understanding of discharge instructions. Opportunity for questioning and answers were provided. Armband removed by staff, pt discharged from ED ambulatory.   

## 2020-03-21 NOTE — Discharge Instructions (Signed)
Take prednisone as directed.   Please follow up with your primary care provider within 5-7 days for re-evaluation of your symptoms. If you do not have a primary care provider, information for a healthcare clinic has been provided for you to make arrangements for follow up care. Please return to the emergency department for any new or worsening symptoms.  

## 2020-03-21 NOTE — ED Triage Notes (Signed)
Pt arrives POV for eval of L foot pain onset 3 days PTA.States hx of gout, not on meds, last episode years ago.

## 2021-01-07 ENCOUNTER — Other Ambulatory Visit: Payer: Self-pay

## 2021-01-07 ENCOUNTER — Ambulatory Visit (HOSPITAL_COMMUNITY)
Admission: EM | Admit: 2021-01-07 | Discharge: 2021-01-07 | Disposition: A | Discharge status: Home / Self Care | Payer: No Typology Code available for payment source | Attending: Internal Medicine | Admitting: Internal Medicine

## 2021-01-07 ENCOUNTER — Encounter (HOSPITAL_COMMUNITY): Payer: Self-pay | Admitting: *Deleted

## 2021-01-07 DIAGNOSIS — R42 Dizziness and giddiness: Secondary | ICD-10-CM | POA: Diagnosis not present

## 2021-01-07 MED ORDER — MECLIZINE HCL 12.5 MG PO TABS: 12.5000 mg | ORAL_TABLET | Freq: Three times a day (TID) | ORAL | 0 refills | Status: DC | PRN

## 2021-01-07 NOTE — Discharge Instructions (Signed)
Can use meclizine every 8 hours as needed to help with dizziness  Follow up with primary doctor in 1 week if symptoms persist  At any point if you start to have visual changes, chest pain, shortness of breath, weakness in arms, sever headache please go to nearest emergency department  Make appointment to have CPAP fixed as soon as possible

## 2021-01-07 NOTE — ED Triage Notes (Signed)
Pt reports having dizzy spells today . Feels like room is spinning. Pt denies any N/V/D . Pt denies Visions changes.

## 2021-01-09 NOTE — ED Provider Notes (Signed)
[facility]    CSN: [id number] Arrival date & time: 01/07/21  1813      History   Chief Complaint Chief Complaint  Patient presents with  . Dizziness  . Chest Pain  . Shortness of Breath    HPI Glenn [name] is a 55 y.o. male.   Patient presents with intermittent dizziness beginning this morning after sitting up in bed. Has had 3-4 episodes today. Feels as if the room is spinning and off balance. Has occurred with movement and while at rest. Denies nausea, vomiting, visual changes, chest pain, shortness of breath, headaches, lightheadedness, speech changes, dietary changes. Endorses poor sleeping habits over the last few weeks due to CPAP being broken. Has not attempted for it to be fixed.   Past Medical History:  Diagnosis Date  . A-fib (HCC) 09/29/15  . Cardiomyopathy (HCC)   . Dysrhythmia   . Enlarged prostate   . Gout   . Hypertension     Patient Active Problem List   Diagnosis Date Noted  . Atrial fibrillation with RVR (HCC) 09/29/2015  . Atrial fibrillation (HCC) 09/29/2015  . Paroxysmal atrial fibrillation (HCC)   . 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 Essential hypertension     Past Surgical History:  Procedure Laterality Date  . CARDIAC DEFIBRILLATOR PLACEMENT  2013   Medtronic Evera dual chamber ICD implanted  by Dr Wendi Maya at [facility] Medications    Prior to Admission medications   Medication Sig Start Date End Date Taking? Authorizing Provider  meclizine (ANTIVERT) 12.5 MG tablet Take 1 tablet (12.5 mg total) by mouth 3 (three) times daily as needed for dizziness. 01/07/21  Yes Johnthan Axtman, Elita Boone, NP  apixaban (ELIQUIS) 5 MG TABS tablet Take 5 mg by mouth 2 (two) times daily.    [provider]  atorvastatin (LIPITOR) 80 MG tablet Take 80 mg by mouth daily.    [provider]  buPROPion (WELLBUTRIN XL) 300 MG 24 hr tablet Take 300 mg by mouth daily.    [provider]  carvedilol (COREG) 25 MG tablet Take 25 mg by mouth 2 (two) times daily with a meal.    [provider]  cholecalciferol (VITAMIN D) 1000 UNITS tablet Take 1,000 Units by mouth daily.    [provider]  diltiazem (CARDIZEM CD) 240 MG 24 hr capsule Take 1 capsule (240 mg total) by mouth daily. 09/30/15   Sheilah Pigeon, PA-C  levofloxacin (LEVAQUIN) 750 MG tablet Take 1 tablet (750 mg total) by mouth daily. X 7 days 07/01/18   Arby Barrette, MD  loperamide (IMODIUM) 2 MG capsule Take 2 mg by mouth as needed for diarrhea or loose stools (q4 hours as needed for diarrhea).    [provider]  losartan (COZAAR) 100 MG tablet Take 100 mg by mouth daily.    [provider]  magnesium oxide (MAG-OX) 400 MG tablet Take 400 mg by mouth daily.    [provider]  omeprazole (PRILOSEC) 20 MG capsule Take 20 mg by mouth daily.    [provider]  ondansetron (ZOFRAN ODT) 4 MG disintegrating tablet Take 1 tablet (4 mg total) by mouth every 4 (four) hours as needed for nausea or vomiting. 07/01/18   Arby Barrette, MD  spironolactone (ALDACTONE) 25 MG tablet Take 0.5 tablets (12.5 mg total) by mouth daily. 09/30/15   Sheilah Pigeon, PA-C  tamsulosin (FLOMAX) 0.4 MG CAPS capsule  Take 1 capsule (0.4 mg total) by mouth daily. 07/01/18   Arby Barrette, MD  terazosin (HYTRIN) 5 MG capsule Take 10 mg by mouth at bedtime.     [provider]  terbinafine (LAMISIL) 1 % cream Apply 1 application topically 2 (two) times daily.    [provider]    Family History Family History  Problem Relation Age of Onset  . Diabetes Mother   . Hypertension Mother   . Hypertension Father     Social History Social History   Tobacco Use  . Smoking status: Never Smoker  . Smokeless tobacco: Never Used  Substance Use Topics  . Alcohol use: Yes    Comment: weekend use  . Drug use: No     Allergies   Ace inhibitors and  Penicillins   Review of Systems Review of Systems  Constitutional: Negative.   HENT: Negative.   Respiratory: Negative.   Cardiovascular: Negative.   Musculoskeletal: Negative.   Skin: Negative.   Neurological: Positive for dizziness. Negative for tremors, seizures, syncope, facial asymmetry, speech difficulty, weakness, light-headedness, numbness and headaches.     Physical Exam Triage Vital Signs ED Triage Vitals  Enc Vitals Group     BP 01/07/21 1834 (!) 144/95     Pulse Rate 01/07/21 1834 78     Resp 01/07/21 1834 18     Temp 01/07/21 1834 98.5 F (36.9 C)     Temp src --      SpO2 01/07/21 1834 98 %     Weight --      Height --      Head Circumference --      Peak Flow --      Pain Score 01/07/21 1835 0     Pain Loc --      Pain Edu? --      Excl. in GC? --    No data found.  Updated Vital Signs BP (!) 144/95   Pulse (!) 132   Temp 98.5 F (36.9 C)   Resp 18   SpO2 96%   Visual Acuity Right Eye Distance:   Left Eye Distance:   Bilateral Distance:    Right Eye Near:   Left Eye Near:    Bilateral Near:     Physical Exam Constitutional:      Appearance: He is well-developed and normal weight.  HENT:     Head: Normocephalic.     Right Ear: Tympanic membrane, ear canal and external ear normal.     Left Ear: Tympanic membrane, ear canal and external ear normal.  Eyes:     Extraocular Movements: Extraocular movements intact.     Conjunctiva/sclera: Conjunctivae normal.     Pupils: Pupils are equal, round, and reactive to light.  Cardiovascular:     Rate and Rhythm: Normal rate and regular rhythm.     Pulses: Normal pulses.     Heart sounds: Normal heart sounds.  Pulmonary:     Effort: Pulmonary effort is normal.     Breath sounds: Normal breath sounds.  Musculoskeletal:        General: Normal range of motion.     Cervical back: Normal range of motion and neck supple.  Skin:    General: Skin is warm and dry.  Neurological:     Mental Status:  He is alert and oriented to person, place, and time. Mental status is at baseline.     Cranial Nerves: Cranial nerves are intact.     Sensory:  Sensation is intact.     Motor: Motor function is intact.  Psychiatric:        Mood and Affect: Mood normal.        Behavior: Behavior normal.        Thought Content: Thought content normal.        Judgment: Judgment normal.      UC Treatments / Results  Labs (all labs ordered are listed, but only abnormal results are displayed) Labs Reviewed - No data to display  EKG   Radiology No results found.  Procedures Procedures (including critical care time)  Medications Ordered in UC Medications - No data to display  Initial Impression / Assessment and Plan / UC Course  I have reviewed the triage vital signs and the nursing notes.  Pertinent labs & imaging results that were available during my care of the patient were reviewed by me and considered in my medical decision making (see chart for details).  Dizziness  1. EKG - Atrial paced 2. Meclizine 12.5 mg tid prn 3. PCP follow up 1 week in symptoms persist 4. Encourage patient to have CPAP fixed as soon as possible   2.  Final Clinical Impressions(s) / UC Diagnoses   Final diagnoses:  Dizziness     Discharge Instructions     Can use meclizine every 8 hours as needed to help with dizziness  Follow up with primary doctor in 1 week if symptoms persist  At any point if you start to have visual changes, chest pain, shortness of breath, weakness in arms, sever headache please go to nearest emergency department  Make appointment to have CPAP fixed as soon as possible      ED Prescriptions    Medication Sig Dispense Auth. Provider   meclizine (ANTIVERT) 12.5 MG tablet Take 1 tablet (12.5 mg total) by mouth 3 (three) times daily as needed for dizziness. 30 tablet Valinda Hoar, NP     PDMP not reviewed this encounter.   Valinda Hoar, [place] 01/09/21 [phone]

## 2021-06-04 ENCOUNTER — Encounter (HOSPITAL_COMMUNITY): Payer: Self-pay | Admitting: Emergency Medicine

## 2021-06-04 ENCOUNTER — Emergency Department (HOSPITAL_COMMUNITY)
Admission: EM | Admit: 2021-06-04 | Discharge: 2021-06-04 | Disposition: A | Discharge status: Home / Self Care | Payer: No Typology Code available for payment source | Attending: Emergency Medicine | Admitting: Emergency Medicine

## 2021-06-04 ENCOUNTER — Other Ambulatory Visit: Payer: Self-pay

## 2021-06-04 DIAGNOSIS — Z7901 Long term (current) use of anticoagulants: Secondary | ICD-10-CM | POA: Insufficient documentation

## 2021-06-04 DIAGNOSIS — I4891 Unspecified atrial fibrillation: Secondary | ICD-10-CM | POA: Diagnosis not present

## 2021-06-04 DIAGNOSIS — I10 Essential (primary) hypertension: Secondary | ICD-10-CM | POA: Diagnosis not present

## 2021-06-04 DIAGNOSIS — R002 Palpitations: Secondary | ICD-10-CM | POA: Diagnosis present

## 2021-06-04 LAB — CBC WITH DIFFERENTIAL/PLATELET
Abs Immature Granulocytes: 0.01 10*3/uL (ref 0.00–0.07)
Basophils Absolute: 0 10*3/uL (ref 0.0–0.1)
Basophils Relative: 0 %
Eosinophils Absolute: 0.1 10*3/uL (ref 0.0–0.5)
Eosinophils Relative: 1 %
HCT: 42 % (ref 39.0–52.0)
Hemoglobin: 13.5 g/dL (ref 13.0–17.0)
Immature Granulocytes: 0 %
Lymphocytes Relative: 32 %
Lymphs Abs: 2.1 10*3/uL (ref 0.7–4.0)
MCH: 31 pg (ref 26.0–34.0)
MCHC: 32.1 g/dL (ref 30.0–36.0)
MCV: 96.3 fL (ref 80.0–100.0)
Monocytes Absolute: 0.4 10*3/uL (ref 0.1–1.0)
Monocytes Relative: 7 %
Neutro Abs: 4 10*3/uL (ref 1.7–7.7)
Neutrophils Relative %: 60 %
Platelets: 259 10*3/uL (ref 150–400)
RBC: 4.36 MIL/uL (ref 4.22–5.81)
RDW: 13.9 % (ref 11.5–15.5)
WBC: 6.6 10*3/uL (ref 4.0–10.5)
nRBC: 0 % (ref 0.0–0.2)

## 2021-06-04 LAB — PROTIME-INR
INR: 1.4 — ABNORMAL HIGH (ref 0.8–1.2)
Prothrombin Time: 17.3 seconds — ABNORMAL HIGH (ref 11.4–15.2)

## 2021-06-04 LAB — COMPREHENSIVE METABOLIC PANEL
ALT: 25 U/L (ref 0–44)
AST: 23 U/L (ref 15–41)
Albumin: 3.6 g/dL (ref 3.5–5.0)
Alkaline Phosphatase: 60 U/L (ref 38–126)
Anion gap: 6 (ref 5–15)
BUN: 12 mg/dL (ref 6–20)
CO2: 22 mmol/L (ref 22–32)
Calcium: 9.3 mg/dL (ref 8.9–10.3)
Chloride: 111 mmol/L (ref 98–111)
Creatinine, Ser: 1.46 mg/dL — ABNORMAL HIGH (ref 0.61–1.24)
GFR, Estimated: 56 mL/min — ABNORMAL LOW (ref >60)
Glucose, Bld: 100 mg/dL — ABNORMAL HIGH (ref 70–99)
Potassium: 3.9 mmol/L (ref 3.5–5.1)
Sodium: 139 mmol/L (ref 135–145)
Total Bilirubin: 1.5 mg/dL — ABNORMAL HIGH (ref 0.3–1.2)
Total Protein: 6.9 g/dL (ref 6.5–8.1)

## 2021-06-04 LAB — MAGNESIUM: Magnesium: 1.9 mg/dL (ref 1.7–2.4)

## 2021-06-04 LAB — APTT: aPTT: 34 seconds (ref 24–36)

## 2021-06-04 MED ORDER — ETOMIDATE 2 MG/ML IV SOLN
0.1500 mg/kg | Freq: Once | INTRAVENOUS | Status: AC
  Administered 2021-06-04: 16.6 mg via INTRAVENOUS
  Filled 2021-06-04: qty 10

## 2021-06-04 NOTE — ED Provider Notes (Addendum)
[facility] EMERGENCY DEPARTMENT Provider Note   CSN: [id number] Arrival date & time: 06/04/21  1519     History Chief Complaint  Patient presents with   Palpitations    Glenn [name] is a 55 y.o. male.  HPI Patient is a 55 year old gentleman with a past medical history significant for A. fib, cardiomyopathy seems to have a preserved ejection fraction per last echocardiogram done in 2017, HTN  Patient states that he has been taking his Eliquis as prescribed he states he cannot member the last time he missed a dose but it was at least over a month ago.  States that for the past week he has been having alarms from his pacemaker he states that he has not felt lightheaded, short of breath, had chest pain, no lightheadedness or dizziness nausea vomiting diaphoresis.  States that he went to his cardiologist at the [place] today because of his continued pacemaker alarming and was told that he was in A. fib RVR and told to come to the emergency room.  Patient states that he is taking his diltiazem to 40 mg tablet this morning as well as his apixaban and carvedilol.  He states he no longer takes Wellbutrin but is on his medication list  Patient does state that once he was told he was in A. fib RVR he did notice that he had some palpitations he states that these are mild.    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)    Essential hypertension     Past Surgical History:  Procedure Laterality Date   CARDIAC DEFIBRILLATOR PLACEMENT  2013   Medtronic Evera dual chamber ICD implanted  by Dr Wendi Maya at [facility]        Family History  Problem Relation Age of Onset   Diabetes Mother    Hypertension Mother    Hypertension Father     Social History   Tobacco Use   Smoking status: Never   Smokeless tobacco: Never  Substance Use Topics   Alcohol use: Yes    Comment: weekend use   Drug use: No    Home Medications Prior to Admission medications   Medication Sig Start Date End Date Taking? Authorizing Provider  apixaban (ELIQUIS) 5 MG TABS tablet Take 5 mg by mouth 2 (two) times daily.    [provider]  atorvastatin (LIPITOR) 80 MG tablet Take 80 mg by mouth daily.    [provider]  buPROPion (WELLBUTRIN XL) 300 MG 24 hr tablet Take 300 mg by mouth daily.    [provider]  carvedilol (COREG) 25 MG tablet Take 25 mg by mouth 2 (two) times daily with a meal.    [provider]  cholecalciferol (VITAMIN D) 1000 UNITS tablet Take 1,000 Units by mouth daily.    [provider]  diltiazem (CARDIZEM CD) 240 MG 24 hr capsule Take 1 capsule (240 mg total) by mouth daily. 09/30/15   Sheilah Pigeon, PA-C  levofloxacin (LEVAQUIN) 750 MG tablet Take 1 tablet (750 mg total) by mouth daily. X 7 days 07/01/18   Pfeiffer,  Lebron Conners, MD  loperamide (IMODIUM) 2 MG capsule Take 2 mg by mouth as needed for diarrhea or loose stools (q4 hours as needed for diarrhea).    [provider]  losartan (COZAAR) 100 MG tablet Take 100 mg by mouth daily.    [provider]  magnesium oxide (MAG-OX) 400 MG tablet Take 400 mg by mouth daily.    [provider]  meclizine (ANTIVERT) 12.5 MG tablet Take 1 tablet (12.5 mg total) by mouth 3 (three) times daily as needed for dizziness. 01/07/21   Valinda Hoar, NP  omeprazole (PRILOSEC) 20 MG capsule Take 20 mg by mouth daily.    [provider]  ondansetron (ZOFRAN ODT) 4 MG disintegrating tablet Take 1 tablet (4 mg total) by mouth every 4 (four) hours as needed for nausea or vomiting. 07/01/18    Arby Barrette, MD  spironolactone (ALDACTONE) 25 MG tablet Take 0.5 tablets (12.5 mg total) by mouth daily. 09/30/15   Sheilah Pigeon, PA-C  tamsulosin (FLOMAX) 0.4 MG CAPS capsule Take 1 capsule (0.4 mg total) by mouth daily. 07/01/18   Arby Barrette, MD  terazosin (HYTRIN) 5 MG capsule Take 10 mg by mouth at bedtime.     [provider]  terbinafine (LAMISIL) 1 % cream Apply 1 application topically 2 (two) times daily.    [provider]    Allergies    Ace inhibitors, Penicillins, and Thiazide-type diuretics  Review of Systems   Review of Systems  Constitutional:  Negative for chills and fever.  HENT:  Negative for congestion.   Eyes:  Negative for pain.  Respiratory:  Negative for cough and shortness of breath.   Cardiovascular:  Positive for palpitations. Negative for chest pain and leg swelling.  Gastrointestinal:  Negative for abdominal pain and vomiting.  Genitourinary:  Negative for dysuria.  Musculoskeletal:  Negative for myalgias.  Skin:  Negative for rash.  Neurological:  Negative for dizziness and headaches.   Physical Exam Updated Vital Signs BP (!) 121/105 (BP Location: Right Arm)   Pulse 76   Temp 98.1 F (36.7 C) (Oral)   Resp 18   Ht 6' (1.829 m)   Wt 110.7 kg   SpO2 98%   BMI 33.09 kg/m   Physical Exam Vitals and nursing note reviewed.  Constitutional:      General: He is not in acute distress. HENT:     Head: Normocephalic and atraumatic.     Nose: Nose normal.  Eyes:     General: No scleral icterus. Cardiovascular:     Rate and Rhythm: Tachycardia present. Rhythm irregular.     Pulses: Normal pulses.     Heart sounds: Normal heart sounds.  Pulmonary:     Effort: Pulmonary effort is normal. No respiratory distress.     Breath sounds: No wheezing.  Abdominal:     Palpations: Abdomen is soft.     Tenderness: There is no abdominal tenderness.  Musculoskeletal:     Cervical back: Normal range of motion.     Right lower  leg: No edema.     Left lower leg: No edema.  Skin:    General: Skin is warm and dry.     Capillary Refill: Capillary refill takes less than 2 seconds.  Neurological:     Mental Status: He is alert. Mental status is at baseline.  Psychiatric:        Mood and Affect: Mood normal.        Behavior: Behavior normal.  ED Results / Procedures / Treatments   Labs (all labs ordered are listed, but only abnormal results are displayed) Labs Reviewed  COMPREHENSIVE METABOLIC PANEL - Abnormal; Notable for the following components:      Result Value   Glucose, Bld 100 (*)    Creatinine, Ser 1.46 (*)    Total Bilirubin 1.5 (*)    GFR, Estimated 56 (*)    All other components within normal limits  PROTIME-INR - Abnormal; Notable for the following components:   Prothrombin Time 17.3 (*)    INR 1.4 (*)    All other components within normal limits  CBC WITH DIFFERENTIAL/PLATELET  APTT  MAGNESIUM    EKG EKG Interpretation  Date/Time:  Wednesday June 04 2021 15:31:19 EDT Ventricular Rate:  125 PR Interval:    QRS Duration: 88 QT Interval:  326 QTC Calculation: 470 R Axis:   -25 Text Interpretation: Atrial fibrillation with rapid ventricular response with premature ventricular or aberrantly conducted complexes Septal infarct , age undetermined Abnormal ECG Afib RVR Confirmed by Coralee Pesa [phone]) on 06/04/2021 3:50:56 PM  Radiology No results found.  Procedures Procedures   Medications Ordered in ED Medications  etomidate (AMIDATE) injection 16.6 mg (16.6 mg Intravenous Given 06/04/21 1655)    ED Course  I have reviewed the triage vital signs and the nursing notes.  Pertinent labs & imaging results that were available during my care of the patient were reviewed by me and considered in my medical decision making (see chart for details).  Clinical Course as of 06/04/21 1910  Wed Jun 04, 2021  1557 Hearing alarms from pacemaker for ~7 days. Got seen today at [facility]  [WF]   1612 Pacemaker DEVICE: Medtronic Serial Number: [id number] H Type: Lianne Moris DR [id number] DOI: 03/31/2012 [WF]    Clinical Course User Index [WF] Gailen Shelter, PA   MDM Rules/Calculators/A&P                          Patient is a 55 year old male history of A. fib has a pacemaker which was interrogated by his cardiologist earlier today he had an EKG done by his cardiologist as well which showed A. fib RVR he was sent to the ER for intervention  Patient is mentating well has borderline low blood pressures seems to be asymptomatic however with soft blood pressures do not believe the patient benefit from diltiazem.  Lowest blood pressure was systolic 96  Discussed with Dr. Wilkie Aye my attending physician.  I discussed with patient he is agreeable to cardioversion.  Indication is rapid ventricular response with heart rates as high as 150 as low as 90  Soft blood pressures as well.  Patient symptoms may have been ongoing as long as 1 week however he has been taking his Eliquis without any missed doses over the past month or 2.  He is also taking his antihypertensive/antichronotropic drugs  Electrolytes unremarkable CBC unremarkable.  Coags without gross abnormalities.  Magnesium within normal limits.  I discussed this case with my attending physician who cosigned this note including patient's presenting symptoms, physical exam, and planned diagnostics and interventions. Attending physician stated agreement with plan or made changes to plan which were implemented.  Attending physician assessed patient at bedside.   Patient was cardioverted with 120 J  Please see my separate procedure note.  Patient completely asymptomatic at this time.  Medications have completely worn off he is mentating well moving all extremities smile symmetric and he  is requesting discharge home.  Blood pressure is 120/100  He will follow-up with his PCP and cardiologist.  Return precautions are given.  He is  understanding of them.  He understands is a crucial importance of him continue take his cardiac medications.   Final Clinical Impression(s) / ED Diagnoses Final diagnoses:  Atrial fibrillation with RVR [facility])    Rx / DC Orders ED Discharge Orders     None        Gailen Shelter, [place] 06/04/21 1910    Gailen Shelter, [place] 06/04/21 1910    Rozelle Logan, DO 06/04/21 2027

## 2021-06-04 NOTE — ED Provider Notes (Signed)
Emergency Medicine Provider Triage Evaluation Note  Glenn [name] , a 55 y.o. male  was evaluated in triage.  Pt complains of palpitations.  Patient was sent from the [place] due to A. fib with RVR.  Patient reports he has a history of atrial fibrillation.  Patient has been having palpitations and some fatigue since last Thursday.  Patient's pacemaker has been intermittently alarming since that time.  Patient reports that he is currently on Eliquis.    Review of Systems  Positive: Palpitations, fatigue Negative: Chest pain, shortness of breath,  Physical Exam  Ht 6' (1.829 m)   Wt 110.7 kg   BMI 33.09 kg/m  Gen:   Awake, no distress   Resp:  Normal effort  MSK:   Moves extremities without difficulty  Other:    Medical Decision Making  Medically screening exam initiated at 3:37 PM.  Appropriate orders placed.  [name] was informed that the remainder of the evaluation will be completed by another provider, this initial triage assessment does not replace that evaluation, and the importance of remaining in the ED until their evaluation is complete.  EKG shows A. fib with RVR, patient will be moved back to next available room   Berneice Heinrich 06/04/21 1543    Gwyneth Sprout, MD 06/11/21 1544

## 2021-06-04 NOTE — Discharge Instructions (Signed)
Please follow-up with your cardiologist at the [facility].  You may always return to the ER for any new or concerning symptoms.  Please take all of your cardiac medications including your blood thinner and blood pressure medications.

## 2021-06-04 NOTE — ED Notes (Signed)
Disregard "order completed", appointment has NOT been scheduled.

## 2021-06-04 NOTE — ED Provider Notes (Signed)
.  Cardioversion  Date/Time: 06/04/2021 6:47 PM Performed by: Gailen Shelter, PA Authorized by: Gailen Shelter, PA   Consent:    Consent obtained:  Verbal and written   Consent given by:  Patient   Risks discussed:  Induced arrhythmia, pain, cutaneous burn and death   Alternatives discussed:  No treatment, rate-control medication, referral and delayed treatment Pre-procedure details:    Cardioversion basis:  Emergent (Due to rapid ventricular response)   Rhythm:  Atrial fibrillation   Electrode placement:  Anterior-lateral Patient sedated: Yes. Refer to sedation procedure documentation for details of sedation.  Attempt one:    Cardioversion mode:  Synchronous   Waveform:  Biphasic   Shock (Joules):  120   Shock outcome:  Conversion to normal sinus rhythm Post-procedure details:    Patient status:  Awake   Patient tolerance of procedure:  Tolerated well, no immediate complications Comments:     Patient with some borderline blood pressures and A. fib RVR No chest pain.  Discussed the patient who is agreeable to cardioversion.  Please see separate note from Dr. Coralee Pesa regarding procedural sedation.  .Critical Care Performed by: Gailen Shelter, PA Authorized by: Gailen Shelter, PA   Critical care provider statement:    Critical care time (minutes):  35   Critical care time was exclusive of:  Separately billable procedures and treating other patients and teaching time   Critical care was necessary to treat or prevent imminent or life-threatening deterioration of the following conditions:  Circulatory failure (A. fib RVR)   Critical care was time spent personally by me on the following activities:  Development of treatment plan with patient or surrogate, review of old charts, re-evaluation of patient's condition, pulse oximetry, ordering and review of radiographic studies, ordering and review of laboratory studies, ordering and performing treatments and interventions,  obtaining history from patient or surrogate, examination of patient and evaluation of patient's response to treatment   Care discussed with comment:  Attending physician    Gailen Shelter, PA 06/04/21 1849    Rozelle Logan, DO 06/04/21 2026

## 2021-06-04 NOTE — ED Notes (Signed)
Pt called family member to transport home

## 2021-06-04 NOTE — ED Triage Notes (Signed)
Pt arrives from [place] reporting palpitations and some fatigue since Thursday, pacemaker has been alarming, hx afib.  Pt denies SOB, CP.

## 2021-06-05 ENCOUNTER — Telehealth (HOSPITAL_COMMUNITY): Payer: Self-pay

## 2021-06-05 NOTE — Telephone Encounter (Signed)
Reached out to patient to schedule ED f/u post DCCV. Patient stated he will schedule f/u with PCP.

## 2021-07-07 ENCOUNTER — Other Ambulatory Visit: Payer: Self-pay

## 2021-07-07 ENCOUNTER — Emergency Department (HOSPITAL_COMMUNITY)
Admission: EM | Admit: 2021-07-07 | Discharge: 2021-07-08 | Disposition: A | Discharge status: Home / Self Care | Payer: No Typology Code available for payment source | Attending: Emergency Medicine | Admitting: Emergency Medicine

## 2021-07-07 DIAGNOSIS — Z20822 Contact with and (suspected) exposure to covid-19: Secondary | ICD-10-CM | POA: Insufficient documentation

## 2021-07-07 DIAGNOSIS — J101 Influenza due to other identified influenza virus with other respiratory manifestations: Secondary | ICD-10-CM | POA: Diagnosis not present

## 2021-07-07 DIAGNOSIS — Z7901 Long term (current) use of anticoagulants: Secondary | ICD-10-CM | POA: Diagnosis not present

## 2021-07-07 DIAGNOSIS — R509 Fever, unspecified: Secondary | ICD-10-CM | POA: Diagnosis present

## 2021-07-07 DIAGNOSIS — Z79899 Other long term (current) drug therapy: Secondary | ICD-10-CM | POA: Insufficient documentation

## 2021-07-07 DIAGNOSIS — I10 Essential (primary) hypertension: Secondary | ICD-10-CM | POA: Diagnosis not present

## 2021-07-07 LAB — RESP PANEL BY RT-PCR (FLU A&B, COVID) ARPGX2
Influenza A by PCR: POSITIVE — AB
Influenza B by PCR: NEGATIVE
SARS Coronavirus 2 by RT PCR: NEGATIVE

## 2021-07-07 MED ORDER — ONDANSETRON 4 MG PO TBDP
8.0000 mg | ORAL_TABLET | Freq: Once | ORAL | Status: AC
  Administered 2021-07-07: 8 mg via ORAL
  Filled 2021-07-07: qty 2

## 2021-07-07 MED ORDER — ACETAMINOPHEN 500 MG PO TABS
1000.0000 mg | ORAL_TABLET | Freq: Once | ORAL | Status: AC
  Administered 2021-07-07: 1000 mg via ORAL
  Filled 2021-07-07: qty 2

## 2021-07-07 MED ORDER — IBUPROFEN 400 MG PO TABS
600.0000 mg | ORAL_TABLET | Freq: Once | ORAL | Status: AC
  Administered 2021-07-07: 600 mg via ORAL
  Filled 2021-07-07: qty 1

## 2021-07-07 NOTE — ED Provider Notes (Signed)
Emergency Medicine Provider Triage Evaluation Note  Glenn [name] , a 55 y.o. male  was evaluated in triage.  Pt complains of cough, headache, body ache, diarrhea, nasal congestion x3 days.  He is COVID vaccinated, denies any chest pain or shortness of breath.  Nobody around him is sick that he is aware of..  Review of Systems  Positive: URI symptoms Negative: Chest pain, shortness of breath  Physical Exam  BP (!) 146/98 (BP Location: Right Arm)   Pulse 95   Temp (!) 103 F (39.4 C) (Oral)   Resp 17   SpO2 95%  Gen:   Awake, no distress   Resp:  Normal effort  MSK:   Moves extremities without difficulty  Other:    Medical Decision Making  Medically screening exam initiated at 6:27 PM.  Appropriate orders placed.  [name] was informed that the remainder of the evaluation will be completed by another provider, this initial triage assessment does not replace that evaluation, and the importance of remaining in the ED until their evaluation is complete.  COVID flu swab, patient is febrile but not tachycardic.  Not having any chest pain or shortness of breath, lungs are clear to auscultations and not necessarily sure x-ray would be beneficial.   Theron Arista, PA-C 07/07/21 1829    Ernie Avena, MD 07/07/21 1941

## 2021-07-07 NOTE — ED Triage Notes (Signed)
Pt here with URI symptoms X3 days. Cough, fever, nausea, generalized body aches. Denies vomiting

## 2021-07-08 MED ORDER — BENZONATATE 100 MG PO CAPS: 100.0000 mg | ORAL_CAPSULE | Freq: Two times a day (BID) | ORAL | 0 refills | Status: DC | PRN

## 2021-07-08 MED ORDER — OSELTAMIVIR PHOSPHATE 75 MG PO CAPS: 75.0000 mg | ORAL_CAPSULE | Freq: Two times a day (BID) | ORAL | 0 refills | Status: DC

## 2021-07-08 NOTE — ED Notes (Signed)
DC instructions reviewed with pt. PT verbalized understanding. PT DC °

## 2021-07-08 NOTE — ED Provider Notes (Signed)
[facility] EMERGENCY DEPARTMENT Provider Note   CSN: [id number] Arrival date & time: 07/07/21  1759     History Chief Complaint  Patient presents with   URI    Glenn [name] is a 55 y.o. male.  Patient presents to the emergency department with a chief complaint of generalized body aches, fever, chills, cough, and sore throat.  Onset of symptoms was 3 days ago.  He states that he has not been around any sick contacts.  He denies any chest pain or shortness of breath.  Denies successful treatments prior to arrival.  The history is provided by the patient. No language interpreter was used.      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 Authorizing Provider  apixaban (ELIQUIS) 5 MG TABS tablet Take 5 mg by mouth 2 (two) times daily.    [provider]  atorvastatin (LIPITOR) 80 MG tablet Take 80 mg by mouth daily.    [provider]  buPROPion (WELLBUTRIN XL) 300 MG 24 hr tablet Take 300 mg by mouth  daily.    [provider]  carvedilol (COREG) 25 MG tablet Take 25 mg by mouth 2 (two) times daily with a meal.    [provider]  cholecalciferol (VITAMIN D) 1000 UNITS tablet Take 1,000 Units by mouth daily.    [provider]  diltiazem (CARDIZEM CD) 240 MG 24 hr capsule Take 1 capsule (240 mg total) by mouth daily. 09/30/15   Sheilah Pigeon, PA-C  levofloxacin (LEVAQUIN) 750 MG tablet Take 1 tablet (750 mg total) by mouth daily. X 7 days 07/01/18   Arby Barrette, MD  loperamide (IMODIUM) 2 MG capsule Take 2 mg by mouth as needed for diarrhea or loose stools (q4 hours as needed for diarrhea).    [provider]  losartan (COZAAR) 100 MG tablet Take 100 mg by mouth daily.    [provider]  magnesium oxide (MAG-OX) 400 MG tablet Take 400 mg by mouth daily.    [provider]  meclizine (ANTIVERT) 12.5 MG tablet Take 1 tablet (12.5 mg total) by mouth 3 (three) times daily as needed for dizziness. 01/07/21   Valinda Hoar, NP  omeprazole (PRILOSEC) 20 MG capsule Take 20 mg by mouth daily.    [provider]  ondansetron (ZOFRAN ODT) 4 MG disintegrating tablet Take 1 tablet (4  mg total) by mouth every 4 (four) hours as needed for nausea or vomiting. 07/01/18   Arby Barrette, MD  spironolactone (ALDACTONE) 25 MG tablet Take 0.5 tablets (12.5 mg total) by mouth daily. 09/30/15   Sheilah Pigeon, PA-C  tamsulosin (FLOMAX) 0.4 MG CAPS capsule Take 1 capsule (0.4 mg total) by mouth daily. 07/01/18   Arby Barrette, MD  terazosin (HYTRIN) 5 MG capsule Take 10 mg by mouth at bedtime.     [provider]  terbinafine (LAMISIL) 1 % cream Apply 1 application topically 2 (two) times daily.    [provider]    Allergies    Ace inhibitors, Penicillins, and Thiazide-type diuretics  Review of Systems   Review of Systems  All other systems reviewed and are negative.  Physical Exam Updated Vital Signs BP 115/60  (BP Location: Right Arm)   Pulse 70   Temp (!) 97.4 F (36.3 C) (Oral)   Resp 16   SpO2 98%   Physical Exam Vitals and nursing note reviewed.  Constitutional:      General: He is not in acute distress.    Appearance: He is well-developed.  HENT:     Head: Normocephalic and atraumatic.  Eyes:     Conjunctiva/sclera: Conjunctivae normal.  Cardiovascular:     Rate and Rhythm: Normal rate and regular rhythm.     Heart sounds: No murmur heard. Pulmonary:     Effort: Pulmonary effort is normal. No respiratory distress.     Breath sounds: Normal breath sounds.  Abdominal:     Palpations: Abdomen is soft.     Tenderness: There is no abdominal tenderness.  Musculoskeletal:        General: No swelling.     Cervical back: Neck supple.  Skin:    General: Skin is warm and dry.     Capillary Refill: Capillary refill takes less than 2 seconds.  Neurological:     Mental Status: He is alert.  Psychiatric:        Mood and Affect: Mood normal.    ED Results / Procedures / Treatments   Labs (all labs ordered are listed, but only abnormal results are displayed) Labs Reviewed  RESP PANEL BY RT-PCR (FLU A&B, COVID) ARPGX2 - Abnormal; Notable for the following components:      Result Value   Influenza A by PCR POSITIVE (*)    All other components within normal limits    EKG None  Radiology No results found.  Procedures Procedures   Medications Ordered in ED Medications  acetaminophen (TYLENOL) tablet 1,000 mg (1,000 mg Oral Given 07/07/21 1853)  ibuprofen (ADVIL) tablet 600 mg (600 mg Oral Given 07/07/21 1853)  ondansetron (ZOFRAN-ODT) disintegrating tablet 8 mg (8 mg Oral Given 07/07/21 1853)    ED Course  I have reviewed the triage vital signs and the nursing notes.  Pertinent labs & imaging results that were available during my care of the patient were reviewed by me and considered in my medical decision making (see chart for details).    MDM Rules/Calculators/A&P                            Patient here with generalized body aches, fever, chills, cough, and sore throat.  Influenza A test is positive.  Patient reportedly had low blood pressure while out in the waiting room, question the accuracy of this result, as the patient has normal blood pressure when he is brought  back to the exam room.  He has normal mental status.  He is in no acute distress.  I do not think he requires further observation or testing in the emergency department.  Will discharge with Tamiflu and Tessalon Perles.  Return precautions discussed. Final Clinical Impression(s) / ED Diagnoses Final diagnoses:  Influenza A    Rx / DC Orders ED Discharge Orders     None        Roxy Horseman, PA-C 07/08/21 0236    Glynn Octave, MD 07/08/21 [phone]

## 2021-12-04 ENCOUNTER — Inpatient Hospital Stay (HOSPITAL_COMMUNITY): Payer: Medicare Other

## 2021-12-04 ENCOUNTER — Emergency Department (HOSPITAL_COMMUNITY): Payer: Medicare Other

## 2021-12-04 ENCOUNTER — Inpatient Hospital Stay (HOSPITAL_COMMUNITY)
Admission: EM | Admit: 2021-12-04 | Discharge: 2021-12-09 | DRG: 286 | Disposition: A | Discharge status: Home / Self Care | Payer: Medicare Other | Attending: Cardiology | Admitting: Cardiology

## 2021-12-04 ENCOUNTER — Other Ambulatory Visit: Payer: Self-pay

## 2021-12-04 ENCOUNTER — Encounter (HOSPITAL_COMMUNITY): Payer: Self-pay

## 2021-12-04 DIAGNOSIS — F1010 Alcohol abuse, uncomplicated: Secondary | ICD-10-CM | POA: Diagnosis present

## 2021-12-04 DIAGNOSIS — E7800 Pure hypercholesterolemia, unspecified: Secondary | ICD-10-CM | POA: Diagnosis present

## 2021-12-04 DIAGNOSIS — R739 Hyperglycemia, unspecified: Secondary | ICD-10-CM | POA: Diagnosis present

## 2021-12-04 DIAGNOSIS — Z880 Allergy status to penicillin: Secondary | ICD-10-CM | POA: Diagnosis not present

## 2021-12-04 DIAGNOSIS — Z20822 Contact with and (suspected) exposure to covid-19: Secondary | ICD-10-CM | POA: Diagnosis present

## 2021-12-04 DIAGNOSIS — G4733 Obstructive sleep apnea (adult) (pediatric): Secondary | ICD-10-CM | POA: Diagnosis present

## 2021-12-04 DIAGNOSIS — I5023 Acute on chronic systolic (congestive) heart failure: Secondary | ICD-10-CM | POA: Diagnosis not present

## 2021-12-04 DIAGNOSIS — I2510 Atherosclerotic heart disease of native coronary artery without angina pectoris: Secondary | ICD-10-CM | POA: Diagnosis not present

## 2021-12-04 DIAGNOSIS — M109 Gout, unspecified: Secondary | ICD-10-CM | POA: Diagnosis present

## 2021-12-04 DIAGNOSIS — Z7901 Long term (current) use of anticoagulants: Secondary | ICD-10-CM

## 2021-12-04 DIAGNOSIS — N400 Enlarged prostate without lower urinary tract symptoms: Secondary | ICD-10-CM | POA: Diagnosis present

## 2021-12-04 DIAGNOSIS — I493 Ventricular premature depolarization: Secondary | ICD-10-CM | POA: Diagnosis present

## 2021-12-04 DIAGNOSIS — J810 Acute pulmonary edema: Secondary | ICD-10-CM

## 2021-12-04 DIAGNOSIS — I130 Hypertensive heart and chronic kidney disease with heart failure and stage 1 through stage 4 chronic kidney disease, or unspecified chronic kidney disease: Secondary | ICD-10-CM | POA: Diagnosis present

## 2021-12-04 DIAGNOSIS — Z888 Allergy status to other drugs, medicaments and biological substances status: Secondary | ICD-10-CM | POA: Diagnosis not present

## 2021-12-04 DIAGNOSIS — Z95810 Presence of automatic (implantable) cardiac defibrillator: Secondary | ICD-10-CM | POA: Diagnosis not present

## 2021-12-04 DIAGNOSIS — R570 Cardiogenic shock: Secondary | ICD-10-CM | POA: Diagnosis present

## 2021-12-04 DIAGNOSIS — N179 Acute kidney failure, unspecified: Secondary | ICD-10-CM

## 2021-12-04 DIAGNOSIS — Z79899 Other long term (current) drug therapy: Secondary | ICD-10-CM | POA: Diagnosis not present

## 2021-12-04 DIAGNOSIS — Z833 Family history of diabetes mellitus: Secondary | ICD-10-CM

## 2021-12-04 DIAGNOSIS — F32A Depression, unspecified: Secondary | ICD-10-CM | POA: Diagnosis present

## 2021-12-04 DIAGNOSIS — R0602 Shortness of breath: Secondary | ICD-10-CM

## 2021-12-04 DIAGNOSIS — Z8249 Family history of ischemic heart disease and other diseases of the circulatory system: Secondary | ICD-10-CM

## 2021-12-04 DIAGNOSIS — I5033 Acute on chronic diastolic (congestive) heart failure: Secondary | ICD-10-CM | POA: Diagnosis present

## 2021-12-04 DIAGNOSIS — I422 Other hypertrophic cardiomyopathy: Secondary | ICD-10-CM | POA: Diagnosis present

## 2021-12-04 DIAGNOSIS — I509 Heart failure, unspecified: Secondary | ICD-10-CM | POA: Diagnosis not present

## 2021-12-04 DIAGNOSIS — I480 Paroxysmal atrial fibrillation: Secondary | ICD-10-CM | POA: Diagnosis present

## 2021-12-04 DIAGNOSIS — J9601 Acute respiratory failure with hypoxia: Secondary | ICD-10-CM | POA: Diagnosis present

## 2021-12-04 DIAGNOSIS — I340 Nonrheumatic mitral (valve) insufficiency: Secondary | ICD-10-CM | POA: Diagnosis present

## 2021-12-04 DIAGNOSIS — N1831 Chronic kidney disease, stage 3a: Secondary | ICD-10-CM | POA: Diagnosis present

## 2021-12-04 DIAGNOSIS — Y900 Blood alcohol level of less than 20 mg/100 ml: Secondary | ICD-10-CM | POA: Diagnosis present

## 2021-12-04 DIAGNOSIS — R319 Hematuria, unspecified: Secondary | ICD-10-CM | POA: Diagnosis not present

## 2021-12-04 LAB — I-STAT ARTERIAL BLOOD GAS, ED
Acid-base deficit: 10 mmol/L — ABNORMAL HIGH (ref 0.0–2.0)
Bicarbonate: 16.8 mmol/L — ABNORMAL LOW (ref 20.0–28.0)
Calcium, Ion: 1.14 mmol/L — ABNORMAL LOW (ref 1.15–1.40)
HCT: 43 % (ref 39.0–52.0)
Hemoglobin: 14.6 g/dL (ref 13.0–17.0)
O2 Saturation: 100 %
Patient temperature: 99
Potassium: 6.4 mmol/L (ref 3.5–5.1)
Sodium: 138 mmol/L (ref 135–145)
TCO2: 18 mmol/L — ABNORMAL LOW (ref 22–32)
pCO2 arterial: 38.4 mmHg (ref 32–48)
pH, Arterial: 7.251 — ABNORMAL LOW (ref 7.35–7.45)
pO2, Arterial: 459 mmHg — ABNORMAL HIGH (ref 83–108)

## 2021-12-04 LAB — I-STAT CHEM 8, ED
BUN: 18 mg/dL (ref 6–20)
Calcium, Ion: 1.24 mmol/L (ref 1.15–1.40)
Chloride: 111 mmol/L (ref 98–111)
Creatinine, Ser: 2 mg/dL — ABNORMAL HIGH (ref 0.61–1.24)
Glucose, Bld: 210 mg/dL — ABNORMAL HIGH (ref 70–99)
HCT: 41 % (ref 39.0–52.0)
Hemoglobin: 13.9 g/dL (ref 13.0–17.0)
Potassium: 4.8 mmol/L (ref 3.5–5.1)
Sodium: 140 mmol/L (ref 135–145)
TCO2: 21 mmol/L — ABNORMAL LOW (ref 22–32)

## 2021-12-04 LAB — ECHOCARDIOGRAM LIMITED
AV Mean grad: 2 mmHg
AV Peak grad: 3.9 mmHg
Ao pk vel: 0.99 m/s
Calc EF: 52.4 %
Height: 72 in
MV M vel: 3.26 m/s
MV Peak grad: 42.5 mmHg
Radius: 0.8 cm
S' Lateral: 3 cm
Single Plane A2C EF: 49.9 %
Single Plane A4C EF: 53.7 %
Weight: 3680 oz

## 2021-12-04 LAB — CBC WITH DIFFERENTIAL/PLATELET
Abs Immature Granulocytes: 0.02 10*3/uL (ref 0.00–0.07)
Basophils Absolute: 0 10*3/uL (ref 0.0–0.1)
Basophils Relative: 0 %
Eosinophils Absolute: 0.1 10*3/uL (ref 0.0–0.5)
Eosinophils Relative: 1 %
HCT: 42.2 % (ref 39.0–52.0)
Hemoglobin: 13.9 g/dL (ref 13.0–17.0)
Immature Granulocytes: 0 %
Lymphocytes Relative: 31 %
Lymphs Abs: 2.4 10*3/uL (ref 0.7–4.0)
MCH: 31.5 pg (ref 26.0–34.0)
MCHC: 32.9 g/dL (ref 30.0–36.0)
MCV: 95.7 fL (ref 80.0–100.0)
Monocytes Absolute: 0.6 10*3/uL (ref 0.1–1.0)
Monocytes Relative: 8 %
Neutro Abs: 4.4 10*3/uL (ref 1.7–7.7)
Neutrophils Relative %: 60 %
Platelets: 209 10*3/uL (ref 150–400)
RBC: 4.41 MIL/uL (ref 4.22–5.81)
RDW: 14 % (ref 11.5–15.5)
WBC: 7.5 10*3/uL (ref 4.0–10.5)
nRBC: 0 % (ref 0.0–0.2)

## 2021-12-04 LAB — MRSA NEXT GEN BY PCR, NASAL: MRSA by PCR Next Gen: NOT DETECTED

## 2021-12-04 LAB — HEMOGLOBIN A1C
Hgb A1c MFr Bld: 5.4 % (ref 4.8–5.6)
Mean Plasma Glucose: 108.28 mg/dL

## 2021-12-04 LAB — COOXEMETRY PANEL
Carboxyhemoglobin: 1.1 % (ref 0.5–1.5)
Carboxyhemoglobin: 1.4 % (ref 0.5–1.5)
Methemoglobin: <0.7 % (ref 0.0–1.5)
Methemoglobin: <0.7 % (ref 0.0–1.5)
O2 Saturation: 45.1 %
O2 Saturation: 58.6 %
Total hemoglobin: 14.7 g/dL (ref 12.0–16.0)
Total hemoglobin: 15.1 g/dL (ref 12.0–16.0)

## 2021-12-04 LAB — BASIC METABOLIC PANEL
Anion gap: 6 (ref 5–15)
Anion gap: 7 (ref 5–15)
BUN: 17 mg/dL (ref 6–20)
BUN: 18 mg/dL (ref 6–20)
CO2: 19 mmol/L — ABNORMAL LOW (ref 22–32)
CO2: 21 mmol/L — ABNORMAL LOW (ref 22–32)
Calcium: 8.9 mg/dL (ref 8.9–10.3)
Calcium: 7.6 mg/dL — ABNORMAL LOW (ref 8.9–10.3)
Chloride: 114 mmol/L — ABNORMAL HIGH (ref 98–111)
Chloride: 113 mmol/L — ABNORMAL HIGH (ref 98–111)
Creatinine, Ser: 1.92 mg/dL — ABNORMAL HIGH (ref 0.61–1.24)
Creatinine, Ser: 2.48 mg/dL — ABNORMAL HIGH (ref 0.61–1.24)
GFR, Estimated: 40 mL/min — ABNORMAL LOW (ref >60)
GFR, Estimated: 30 mL/min — ABNORMAL LOW (ref >60)
Glucose, Bld: 218 mg/dL — ABNORMAL HIGH (ref 70–99)
Glucose, Bld: 290 mg/dL — ABNORMAL HIGH (ref 70–99)
Potassium: 4.9 mmol/L (ref 3.5–5.1)
Potassium: 4.9 mmol/L (ref 3.5–5.1)
Sodium: 139 mmol/L (ref 135–145)
Sodium: 141 mmol/L (ref 135–145)

## 2021-12-04 LAB — RESP PANEL BY RT-PCR (FLU A&B, COVID) ARPGX2
Influenza A by PCR: NEGATIVE
Influenza B by PCR: NEGATIVE
SARS Coronavirus 2 by RT PCR: NEGATIVE

## 2021-12-04 LAB — LACTIC ACID, PLASMA
Lactic Acid, Venous: 4.5 mmol/L (ref 0.5–1.9)
Lactic Acid, Venous: 2.9 mmol/L (ref 0.5–1.9)

## 2021-12-04 LAB — MAGNESIUM: Magnesium: 1.6 mg/dL — ABNORMAL LOW (ref 1.7–2.4)

## 2021-12-04 LAB — RAPID URINE DRUG SCREEN, HOSP PERFORMED
Amphetamines: NOT DETECTED
Barbiturates: NOT DETECTED
Benzodiazepines: NOT DETECTED
Cocaine: NOT DETECTED
Opiates: NOT DETECTED
Tetrahydrocannabinol: NOT DETECTED

## 2021-12-04 LAB — CBC
HCT: 46 % (ref 39.0–52.0)
Hemoglobin: 14.4 g/dL (ref 13.0–17.0)
MCH: 30.1 pg (ref 26.0–34.0)
MCHC: 31.3 g/dL (ref 30.0–36.0)
MCV: 96 fL (ref 80.0–100.0)
Platelets: 205 10*3/uL (ref 150–400)
RBC: 4.79 MIL/uL (ref 4.22–5.81)
RDW: 14.1 % (ref 11.5–15.5)
WBC: 12 10*3/uL — ABNORMAL HIGH (ref 4.0–10.5)
nRBC: 0 % (ref 0.0–0.2)

## 2021-12-04 LAB — HIV ANTIBODY (ROUTINE TESTING W REFLEX): HIV Screen 4th Generation wRfx: NONREACTIVE

## 2021-12-04 LAB — BRAIN NATRIURETIC PEPTIDE: B Natriuretic Peptide: 142.3 pg/mL — ABNORMAL HIGH (ref 0.0–100.0)

## 2021-12-04 LAB — TROPONIN I (HIGH SENSITIVITY)
Troponin I (High Sensitivity): 12 ng/L (ref <18)
Troponin I (High Sensitivity): 11 ng/L (ref <18)

## 2021-12-04 LAB — PROCALCITONIN: Procalcitonin: <0.1 ng/mL

## 2021-12-04 LAB — ETHANOL: Alcohol, Ethyl (B): 16 mg/dL — ABNORMAL HIGH (ref <10)

## 2021-12-04 LAB — CORTISOL: Cortisol, Plasma: 19.9 ug/dL

## 2021-12-04 LAB — GLUCOSE, CAPILLARY: Glucose-Capillary: 91 mg/dL (ref 70–99)

## 2021-12-04 LAB — HEPARIN LEVEL (UNFRACTIONATED): Heparin Unfractionated: >1.1 IU/mL — ABNORMAL HIGH (ref 0.30–0.70)

## 2021-12-04 LAB — APTT: aPTT: 26 seconds (ref 24–36)

## 2021-12-04 LAB — PROTIME-INR
INR: 1.6 — ABNORMAL HIGH (ref 0.8–1.2)
Prothrombin Time: 18.7 seconds — ABNORMAL HIGH (ref 11.4–15.2)

## 2021-12-04 MED ORDER — FENTANYL CITRATE PF 50 MCG/ML IJ SOSY
PREFILLED_SYRINGE | INTRAMUSCULAR | Status: AC
  Filled 2021-12-04: qty 2

## 2021-12-04 MED ORDER — INSULIN ASPART 100 UNIT/ML IJ SOLN
0.0000 [IU] | INTRAMUSCULAR | Status: DC
  Administered 2021-12-04: 5 [IU] via SUBCUTANEOUS
  Administered 2021-12-05 (×4): 1 [IU] via SUBCUTANEOUS
  Administered 2021-12-06: 2 [IU] via SUBCUTANEOUS
  Administered 2021-12-06: 1 [IU] via SUBCUTANEOUS
  Administered 2021-12-07: 2 [IU] via SUBCUTANEOUS
  Administered 2021-12-07 (×2): 1 [IU] via SUBCUTANEOUS

## 2021-12-04 MED ORDER — SODIUM CHLORIDE 0.9% FLUSH
10.0000 mL | Freq: Two times a day (BID) | INTRAVENOUS | Status: DC
  Administered 2021-12-04 – 2021-12-08 (×9): 10 mL

## 2021-12-04 MED ORDER — EPINEPHRINE PF 1 MG/ML IJ SOLN
0.5000 mg | Freq: Once | INTRAMUSCULAR | Status: AC
  Administered 2021-12-04: 0.5 mg via INTRAVENOUS

## 2021-12-04 MED ORDER — HEPARIN (PORCINE) 25000 UT/250ML-% IV SOLN
900.0000 [IU]/h | INTRAVENOUS | Status: DC
Start: 2021-12-04 — End: 2021-12-06
  Administered 2021-12-04: 1450 [IU]/h via INTRAVENOUS
  Filled 2021-12-04 (×2): qty 250

## 2021-12-04 MED ORDER — ETOMIDATE 2 MG/ML IV SOLN
INTRAVENOUS | Status: AC | PRN
  Administered 2021-12-04: 20 mg via INTRAVENOUS

## 2021-12-04 MED ORDER — DOCUSATE SODIUM 100 MG PO CAPS: 100.0000 mg | ORAL_CAPSULE | Freq: Two times a day (BID) | ORAL | Status: DC | PRN

## 2021-12-04 MED ORDER — ONDANSETRON HCL 4 MG/2ML IJ SOLN
INTRAMUSCULAR | Status: AC
  Filled 2021-12-04: qty 2

## 2021-12-04 MED ORDER — FOLIC ACID 1 MG PO TABS
1.0000 mg | ORAL_TABLET | Freq: Every day | ORAL | Status: DC
  Administered 2021-12-05: 1 mg
  Filled 2021-12-04: qty 1

## 2021-12-04 MED ORDER — SODIUM CHLORIDE 0.9 % IV SOLN
2.0000 g | Freq: Two times a day (BID) | INTRAVENOUS | Status: DC
  Administered 2021-12-04 – 2021-12-05 (×3): 2 g via INTRAVENOUS
  Filled 2021-12-04 (×3): qty 12.5

## 2021-12-04 MED ORDER — KETAMINE HCL 50 MG/5ML IJ SOSY
PREFILLED_SYRINGE | INTRAMUSCULAR | Status: AC
  Filled 2021-12-04: qty 5

## 2021-12-04 MED ORDER — FENTANYL CITRATE PF 50 MCG/ML IJ SOSY: 50.0000 ug | PREFILLED_SYRINGE | INTRAMUSCULAR | Status: DC | PRN

## 2021-12-04 MED ORDER — SODIUM CHLORIDE 0.9% FLUSH: 10.0000 mL | INTRAVENOUS | Status: DC | PRN

## 2021-12-04 MED ORDER — ATORVASTATIN CALCIUM 80 MG PO TABS
80.0000 mg | ORAL_TABLET | Freq: Every day | ORAL | Status: DC
  Administered 2021-12-05: 80 mg
  Filled 2021-12-04: qty 1

## 2021-12-04 MED ORDER — PROPOFOL 1000 MG/100ML IV EMUL
5.0000 ug/kg/min | INTRAVENOUS | Status: DC
  Filled 2021-12-04: qty 100

## 2021-12-04 MED ORDER — SODIUM CHLORIDE 0.9 % IV BOLUS
1000.0000 mL | Freq: Once | INTRAVENOUS | Status: AC
  Administered 2021-12-04: 1000 mL via INTRAVENOUS

## 2021-12-04 MED ORDER — SODIUM BICARBONATE 8.4 % IV SOLN
INTRAVENOUS | Status: AC | PRN
  Administered 2021-12-04: 200 meq via INTRAVENOUS

## 2021-12-04 MED ORDER — VANCOMYCIN HCL 2000 MG/400ML IV SOLN
2000.0000 mg | Freq: Once | INTRAVENOUS | Status: AC
  Administered 2021-12-04: 2000 mg via INTRAVENOUS
  Filled 2021-12-04 (×2): qty 400

## 2021-12-04 MED ORDER — SUCCINYLCHOLINE CHLORIDE 200 MG/10ML IV SOSY
PREFILLED_SYRINGE | INTRAVENOUS | Status: AC
  Filled 2021-12-04: qty 10

## 2021-12-04 MED ORDER — SODIUM BICARBONATE 8.4 % IV SOLN
100.0000 meq | Freq: Once | INTRAVENOUS | Status: AC
  Administered 2021-12-04: 100 meq via INTRAVENOUS

## 2021-12-04 MED ORDER — NOREPINEPHRINE 16 MG/250ML-% IV SOLN
0.0000 ug/min | INTRAVENOUS | Status: DC
  Administered 2021-12-04: 10 ug/min via INTRAVENOUS
  Filled 2021-12-04 (×3): qty 250

## 2021-12-04 MED ORDER — POLYETHYLENE GLYCOL 3350 17 G PO PACK: 17.0000 g | PACK | Freq: Every day | ORAL | Status: DC | PRN

## 2021-12-04 MED ORDER — FUROSEMIDE 10 MG/ML IJ SOLN
120.0000 mg | Freq: Once | INTRAVENOUS | Status: AC
  Administered 2021-12-04: 120 mg via INTRAVENOUS
  Filled 2021-12-04: qty 10

## 2021-12-04 MED ORDER — CHLORHEXIDINE GLUCONATE CLOTH 2 % EX PADS
6.0000 | MEDICATED_PAD | Freq: Every day | CUTANEOUS | Status: DC
  Administered 2021-12-04 – 2021-12-08 (×5): 6 via TOPICAL

## 2021-12-04 MED ORDER — MIDAZOLAM HCL 2 MG/2ML IJ SOLN
INTRAMUSCULAR | Status: DC | PRN
  Administered 2021-12-04: 2 mg via INTRAVENOUS

## 2021-12-04 MED ORDER — ETOMIDATE 2 MG/ML IV SOLN
INTRAVENOUS | Status: AC
  Filled 2021-12-04: qty 20

## 2021-12-04 MED ORDER — SODIUM BICARBONATE 8.4 % IV SOLN
100.0000 meq | Freq: Once | INTRAVENOUS | Status: AC
  Administered 2021-12-04: 100 meq via INTRAVENOUS
  Filled 2021-12-04: qty 100

## 2021-12-04 MED ORDER — CHLORHEXIDINE GLUCONATE 0.12% ORAL RINSE (MEDLINE KIT)
15.0000 mL | Freq: Two times a day (BID) | OROMUCOSAL | Status: DC
  Administered 2021-12-04 – 2021-12-06 (×5): 15 mL via OROMUCOSAL

## 2021-12-04 MED ORDER — POLYETHYLENE GLYCOL 3350 17 G PO PACK
17.0000 g | PACK | Freq: Every day | ORAL | Status: DC
  Administered 2021-12-05: 17 g
  Filled 2021-12-04: qty 1

## 2021-12-04 MED ORDER — PHENTOLAMINE MESYLATE 5 MG IJ SOLR
5.0000 mg | Freq: Once | INTRAMUSCULAR | Status: AC
  Administered 2021-12-04: 5 mg via SUBCUTANEOUS
  Filled 2021-12-04 (×2): qty 5

## 2021-12-04 MED ORDER — STERILE WATER FOR INJECTION IJ SOLN
INTRAMUSCULAR | Status: AC
  Administered 2021-12-04: 10 mL
  Filled 2021-12-04: qty 10

## 2021-12-04 MED ORDER — ROCURONIUM BROMIDE 50 MG/5ML IV SOLN
100.0000 mg | Freq: Once | INTRAVENOUS | Status: DC
  Filled 2021-12-04: qty 10

## 2021-12-04 MED ORDER — MIDAZOLAM HCL 2 MG/2ML IJ SOLN
2.0000 mg | Freq: Once | INTRAMUSCULAR | Status: DC
  Filled 2021-12-04: qty 2

## 2021-12-04 MED ORDER — ORAL CARE MOUTH RINSE
15.0000 mL | OROMUCOSAL | Status: DC
  Administered 2021-12-04 – 2021-12-06 (×17): 15 mL via OROMUCOSAL

## 2021-12-04 MED ORDER — MIDAZOLAM HCL 2 MG/2ML IJ SOLN
INTRAMUSCULAR | Status: DC
Start: 2021-12-04 — End: 2021-12-04
  Filled 2021-12-04: qty 2

## 2021-12-04 MED ORDER — VASOPRESSIN 20 UNITS/100 ML INFUSION FOR SHOCK
0.0000 [IU]/min | INTRAVENOUS | Status: DC
  Administered 2021-12-04: 0.03 [IU]/min via INTRAVENOUS
  Filled 2021-12-04 (×2): qty 100

## 2021-12-04 MED ORDER — ADULT MULTIVITAMIN W/MINERALS CH
1.0000 | ORAL_TABLET | Freq: Every day | ORAL | Status: DC
  Administered 2021-12-05: 1
  Filled 2021-12-04: qty 1

## 2021-12-04 MED ORDER — THIAMINE HCL 100 MG/ML IJ SOLN
100.0000 mg | Freq: Every day | INTRAMUSCULAR | Status: DC
  Administered 2021-12-05 – 2021-12-09 (×5): 100 mg via INTRAVENOUS
  Filled 2021-12-04 (×5): qty 2

## 2021-12-04 MED ORDER — LACTATED RINGERS IV BOLUS: 500.0000 mL | Freq: Once | INTRAVENOUS | Status: DC

## 2021-12-04 MED ORDER — HYDROCORTISONE SOD SUC (PF) 100 MG IJ SOLR
100.0000 mg | Freq: Three times a day (TID) | INTRAMUSCULAR | Status: DC
Start: 2021-12-04 — End: 2021-12-06
  Administered 2021-12-04 – 2021-12-06 (×6): 100 mg via INTRAVENOUS
  Filled 2021-12-04 (×6): qty 2

## 2021-12-04 MED ORDER — NITROGLYCERIN 2 % TD OINT
1.0000 [in_us] | TOPICAL_OINTMENT | Freq: Three times a day (TID) | TRANSDERMAL | Status: DC
  Administered 2021-12-04: 1 [in_us] via TOPICAL
  Filled 2021-12-04: qty 30

## 2021-12-04 MED ORDER — DOBUTAMINE IN D5W 4-5 MG/ML-% IV SOLN
2.5000 ug/kg/min | INTRAVENOUS | Status: DC
  Administered 2021-12-04: 2.5 ug/kg/min via INTRAVENOUS
  Filled 2021-12-04: qty 250

## 2021-12-04 MED ORDER — ROCURONIUM BROMIDE 50 MG/5ML IV SOLN
INTRAVENOUS | Status: DC | PRN
  Administered 2021-12-04: 100 mg via INTRAVENOUS

## 2021-12-04 MED ORDER — NOREPINEPHRINE 4 MG/250ML-% IV SOLN
0.0000 ug/min | INTRAVENOUS | Status: DC
  Administered 2021-12-04: 5 ug/min via INTRAVENOUS

## 2021-12-04 MED ORDER — ROCURONIUM BROMIDE 10 MG/ML (PF) SYRINGE
PREFILLED_SYRINGE | INTRAVENOUS | Status: AC
  Filled 2021-12-04: qty 10

## 2021-12-04 MED ORDER — DOCUSATE SODIUM 50 MG/5ML PO LIQD
100.0000 mg | Freq: Two times a day (BID) | ORAL | Status: DC
  Administered 2021-12-04 – 2021-12-05 (×2): 100 mg
  Filled 2021-12-04 (×2): qty 10

## 2021-12-04 MED ORDER — FENTANYL 2500MCG IN NS 250ML (10MCG/ML) PREMIX INFUSION
0.0000 ug/h | INTRAVENOUS | Status: DC
  Administered 2021-12-04: 125 ug/h via INTRAVENOUS
  Administered 2021-12-05: 275 ug/h via INTRAVENOUS
  Administered 2021-12-05: 300 ug/h via INTRAVENOUS
  Filled 2021-12-04 (×2): qty 250

## 2021-12-04 MED ORDER — SODIUM CHLORIDE 0.9 % IV BOLUS
1000.0000 mL | Freq: Once | INTRAVENOUS | Status: AC
Start: 2021-12-04 — End: 2021-12-05
  Administered 2021-12-04: 1000 mL via INTRAVENOUS

## 2021-12-04 MED ORDER — ACETAMINOPHEN 325 MG PO TABS: 650.0000 mg | ORAL_TABLET | ORAL | Status: DC | PRN

## 2021-12-04 MED ORDER — SUCCINYLCHOLINE CHLORIDE 20 MG/ML IJ SOLN
INTRAMUSCULAR | Status: AC | PRN
  Administered 2021-12-04: 150 mg via INTRAVENOUS

## 2021-12-04 MED ORDER — EPINEPHRINE 1 MG/10ML IJ SOSY
PREFILLED_SYRINGE | INTRAMUSCULAR | Status: AC
  Filled 2021-12-04: qty 10

## 2021-12-04 MED ORDER — FENTANYL 2500MCG IN NS 250ML (10MCG/ML) PREMIX INFUSION
50.0000 ug/h | INTRAVENOUS | Status: DC
  Administered 2021-12-04: 50 ug/h via INTRAVENOUS
  Filled 2021-12-04: qty 250

## 2021-12-04 MED ORDER — FENTANYL BOLUS VIA INFUSION
50.0000 ug | INTRAVENOUS | Status: DC | PRN
  Administered 2021-12-04: 100 ug via INTRAVENOUS
  Filled 2021-12-04: qty 100

## 2021-12-04 MED ORDER — PANTOPRAZOLE 2 MG/ML SUSPENSION: 40.0000 mg | Freq: Every day | ORAL | Status: DC

## 2021-12-04 MED ORDER — EPINEPHRINE HCL 5 MG/250ML IV SOLN IN NS
0.5000 ug/min | INTRAVENOUS | Status: DC
  Administered 2021-12-04: 5 ug/min via INTRAVENOUS
  Filled 2021-12-04 (×2): qty 250

## 2021-12-04 NOTE — ED Notes (Signed)
MD at bedside. Transfer pt to trauma C for intubation ?

## 2021-12-04 NOTE — Progress Notes (Signed)
?  Echocardiogram ?2D Echocardiogram has been performed. ? ?Glenn [name] ?12/04/2021, 12:17 PM ?

## 2021-12-04 NOTE — Procedures (Signed)
Central Venous Catheter Insertion Procedure Note ? ?Glenn [name]  ?[id number]  ?12/23/1965 ? ?Date:12/04/21  ?Time:1:10 PM  ? ?Provider Performing:Brent Glena Pharris  ? ?Procedure: Insertion of Non-tunneled Central Venous Catheter(36556) with US guidance (81856)  ? ?Indication(s) ?Medication administration and Difficult access ? ?Consent ?Risks of the procedure as well as the alternatives and risks of each were explained to the patient and/or caregiver.  Consent for the procedure was obtained and is signed in the bedside chart ? ?Anesthesia ?Topical only with 1% lidocaine  ? ?Timeout ?Verified patient identification, verified procedure, site/side was marked, verified correct patient position, special equipment/implants available, medications/allergies/relevant history reviewed, required imaging and test results available. ? ?Sterile Technique ?Maximal sterile technique including full sterile barrier drape, hand hygiene, sterile gown, sterile gloves, mask, hair covering, sterile ultrasound probe cover (if used). ? ?Procedure Description ?Area of catheter insertion was cleaned with chlorhexidine and draped in sterile fashion.  With real-time ultrasound guidance a HD catheter was placed into the right internal jugular vein. Nonpulsatile blood flow and easy flushing noted in all ports.  The catheter was sutured in place and sterile dressing applied. ? ?Complications/Tolerance ?None; patient tolerated the procedure well. ?Chest X-ray is ordered to verify placement for internal jugular or subclavian cannulation.   Chest x-ray is not ordered for femoral cannulation. ? ?EBL ?Minimal ? ?Specimen(s) ?None ? ?Glenn [place], MD ?[facility] ?Pager: [phone] ?Cell: [phone] ?After 7:00 pm call Elink  [phone] ? ? ?

## 2021-12-04 NOTE — Progress Notes (Addendum)
Pharmacy Antibiotic Note ? ?Glenn [name] is a 56 y.o. male admitted on 12/04/2021 with  Sepsis .  Pharmacy has been consulted for Vancomycin/Cefepime dosing. Scr 2.0 mg/dL (baseline ~4.2-6.8). ? ?Prolonged hypotension, anticipating further kidney injury. ? ?Plan: ?Vancomycin bolus 2 g IV x 1, will dose by levels ?Cefepime 2 g IV every 12 hours ?Plan to dose by levels until renal function stabilizes ?F/u cultures for therapy de-escalation  ? ?Height: 6' (182.9 cm) ?Weight: 104.3 kg (230 lb) ?IBW/kg (Calculated) : 77.6 ? ?Temp (24hrs), Avg:99.1 ?F (37.3 ?C), Min:99.1 ?F (37.3 ?C), Max:99.1 ?F (37.3 ?C) ? ?Recent Labs  ?Lab 12/04/21 ?1054 12/04/21 ?1129  ?WBC 7.5  --   ?CREATININE 1.92* 2.00*  ?  ?Estimated Creatinine Clearance: 50.9 mL/min (A) (by C-G formula based on SCr of 2 mg/dL (H)).   ? ?Allergies  ?Allergen Reactions  ? Ace Inhibitors Swelling  ?  Swelling lips  ? Penicillins Other (See Comments)  ?  Childhood allergy  ? Thiazide-Type Diuretics   ? ? ?Antimicrobials this admission: ?4/20 Vancomycin >> ?4/20 Cefepime >>  ? ?Microbiology results: ?4/20 BCx: In process ?4/20 UCx: In process  ?4/20 RespCx (tracheal aspirate): In process  ?4/20 MRSA PCR: In process ?4/20 Resp Panel by PCR: In process ? ?Thank you for allowing pharmacy to be a part of this patient?s care. ? ?Erick Colace ?12/04/2021 1:55 PM ? ?

## 2021-12-04 NOTE — ED Triage Notes (Signed)
Pt arrived to ED via EMS from home w/ c/o shob and hypotension. Pt reports he has been feeling shob for a couple weeks, but has gotten worse. Pt reports not being able to breath. Pt initially alert and pt is becoming more drowsy during triage. Hyacinth Meeker MD notified and at bedside. Pt is diaphoretic, hypotensive, tachypneic very labored breathing. Pt arrived on NRB at 15L, BP 50/24. EMS gave NS, HR 120's going between A-fib and paced rhythm. ?

## 2021-12-04 NOTE — Consult Note (Addendum)
Advanced Heart Failure Team Consult Note   Primary Physician: Patient, No Pcp Per (Inactive) PCP-Cardiologist:  None  Reason for Consultation: Shock  HPI:    Glenn [name] is seen today for evaluation of shock at the request of Dr. Kendrick Fries with CCM. 55 y.o. male with history of atrial fibrillation, hx cardiomyopathy (HCM suspected) w/ ICD placement in 2013, HTN.  He follows through [place] in [place]. Details of recent cardiac history uncertain.   His daughter is at bedside and reports he seemed to be doing well up until this morning. Felt short of breath several hours prior to arrival. She went to check on him shortly after and he was too weak to stand. EMS was called.  Triage notes suggest 2 weeks of worsening dyspnea. He was hypotensive and wearing NRB on arrival. He was in AF with rates 120s and PVCs. Continued with respiratory distress and was urgently intubated. Scr 2.0 (1.46 10/122), CO2 21, K 4.8.  Central line placed. Has been given IV fluids and started on NE, Vaso and Epi to maintain MAPS. Labs significant for Scr 1.92, CO2 19, K 4.9, Hgb 13.9, WBC 7.5, Troponin 12. Awaiting BNP, ABG, BC, UDS and COVID/flu.   Echo pending. On preliminary review appears to have severe MR and RV dysfunction.  He is a disabled Cytogeneticist. Consumes alcohol daily per daughter, he appears to be a heavy drinker. No hx drug or tobacco use.  Review of Systems: [y] = yes, [ ]  = no  Limited by altered mental status.   Home Medications Prior to Admission medications   Medication Sig Start Date End Date Taking? Authorizing Provider  apixaban (ELIQUIS) 5 MG TABS tablet Take 5 mg by mouth 2 (two) times daily.    [provider]  atorvastatin (LIPITOR) 80 MG tablet Take 80 mg by mouth daily.    [provider]  benzonatate (TESSALON) 100 MG capsule Take 1 capsule (100 mg total) by mouth 2 (two) times daily as needed for cough. 07/08/21   Roxy Horseman, PA-C  buPROPion (WELLBUTRIN XL) 300 MG  24 hr tablet Take 300 mg by mouth daily.    [provider]  carvedilol (COREG) 25 MG tablet Take 25 mg by mouth 2 (two) times daily with a meal.    [provider]  cholecalciferol (VITAMIN D) 1000 UNITS tablet Take 1,000 Units by mouth daily.    [provider]  diltiazem (CARDIZEM CD) 240 MG 24 hr capsule Take 1 capsule (240 mg total) by mouth daily. 09/30/15   Sheilah Pigeon, PA-C  levofloxacin (LEVAQUIN) 750 MG tablet Take 1 tablet (750 mg total) by mouth daily. X 7 days 07/01/18   Arby Barrette, MD  loperamide (IMODIUM) 2 MG capsule Take 2 mg by mouth as needed for diarrhea or loose stools (q4 hours as needed for diarrhea).    [provider]  losartan (COZAAR) 100 MG tablet Take 100 mg by mouth daily.    [provider]  magnesium oxide (MAG-OX) 400 MG tablet Take 400 mg by mouth daily.    [provider]  meclizine (ANTIVERT) 12.5 MG tablet Take 1 tablet (12.5 mg total) by mouth 3 (three) times daily as needed for dizziness. 01/07/21   Valinda Hoar, NP  omeprazole (PRILOSEC) 20 MG capsule Take 20 mg by mouth daily.    [provider]  ondansetron (ZOFRAN ODT) 4 MG disintegrating tablet Take 1 tablet (4 mg total) by mouth every 4 (four) hours as  needed for nausea or vomiting. 07/01/18   Arby Barrette, MD  oseltamivir (TAMIFLU) 75 MG capsule Take 1 capsule (75 mg total) by mouth every 12 (twelve) hours. 07/08/21   Roxy Horseman, PA-C  spironolactone (ALDACTONE) 25 MG tablet Take 0.5 tablets (12.5 mg total) by mouth daily. 09/30/15   Sheilah Pigeon, PA-C  tamsulosin (FLOMAX) 0.4 MG CAPS capsule Take 1 capsule (0.4 mg total) by mouth daily. 07/01/18   Arby Barrette, MD  terazosin (HYTRIN) 5 MG capsule Take 10 mg by mouth at bedtime.     [provider]  terbinafine (LAMISIL) 1 % cream Apply 1 application topically 2 (two) times daily.    [provider]    Past Medical History: Past Medical  History:  Diagnosis Date   A-fib (HCC) 09/29/15   Cardiomyopathy (HCC)    Dysrhythmia    Enlarged prostate    Gout    Hypertension     Past Surgical History: Past Surgical History:  Procedure Laterality Date   CARDIAC DEFIBRILLATOR PLACEMENT  2013   Medtronic Evera dual chamber ICD implanted  by Dr Wendi Maya at [facility]    Family History: Family History  Problem Relation Age of Onset   Diabetes Mother    Hypertension Mother    Hypertension Father     Social History: Social History   Socioeconomic History   Marital status: Married    Spouse name: Not on file   Number of children: Not on file   Years of education: Not on file   Highest education level: Not on file  Occupational History   Not on file  Tobacco Use   Smoking status: Never   Smokeless tobacco: Never  Substance and Sexual Activity   Alcohol use: Yes    Comment: weekend use   Drug use: No   Sexual activity: Yes  Other Topics Concern   Not on file  Social History Narrative   Not on file   Social Determinants of Health   Financial Resource Strain: Not on file  Food Insecurity: Not on file  Transportation Needs: Not on file  Physical Activity: Not on file  Stress: Not on file  Social Connections: Not on file    Allergies:  Allergies  Allergen Reactions   Ace Inhibitors Swelling    Swelling lips   Penicillins Other (See Comments)    Childhood allergy   Thiazide-Type Diuretics     Objective:    Vital Signs:   Temp:  [99.1 F (37.3 C)] 99.1 F (37.3 C) (04/20 1109) Pulse Rate:  [39-81] 62 (04/20 1131) Resp:  [19-50] 50 (04/20 1131) BP: (60-121)/(32-82) 121/82 (04/20 1130) SpO2:  [95 %-100 %] 99 % (04/20 1131) FiO2 (%):  [40 %] 40 % (04/20 1117) Weight:  [104.3 kg] 104.3 kg (04/20 1106)    Weight change: Filed Weights   12/04/21 1106  Weight: 104.3 kg    Intake/Output:   Intake/Output Summary (Last 24 hours) at 12/04/2021 1132 Last data filed at 12/04/2021 1121 Gross per 24  hour  Intake 3.58 ml  Output --  Net 3.58 ml      Physical Exam    General: Intubated, sedated HEENT: normal Neck: supple. JVP 10-12. Carotids 2+ bilat; no bruits.  Cor: PMI nondisplaced. Regular rate & rhythm. No rubs, gallops, 3/6 MR murmur Lungs: clear anteriorly Abdomen: soft, nontender, + distended. No hepatosplenomegaly.  Extremities: no cyanosis, clubbing, rash, trace edema, legs are cool Neuro: alert & orientedx3, cranial nerves grossly intact. moves all  4 extremities w/o difficulty. Affect pleasant   Telemetry   Underlying AF, V Paced 70s, PVCs  EKG    AF, Vpaced 74 bpm  Labs   Basic Metabolic Panel: Recent Labs  Lab 12/04/21 1129  NA 140  K 4.8  CL 111  GLUCOSE 210*  BUN 18  CREATININE 2.00*    Liver Function Tests: No results for input(s): AST, ALT, ALKPHOS, BILITOT, PROT, ALBUMIN in the last 168 hours. No results for input(s): LIPASE, AMYLASE in the last 168 hours. No results for input(s): AMMONIA in the last 168 hours.  CBC: Recent Labs  Lab 12/04/21 1054 12/04/21 1129  WBC 7.5  --   NEUTROABS 4.4  --   HGB 13.9 13.9  HCT 42.2 41.0  MCV 95.7  --   PLT 209  --     Cardiac Enzymes: No results for input(s): CKTOTAL, CKMB, CKMBINDEX, TROPONINI in the last 168 hours.  BNP: BNP (last 3 results) No results for input(s): BNP in the last 8760 hours.  ProBNP (last 3 results) No results for input(s): PROBNP in the last 8760 hours.   CBG: No results for input(s): GLUCAP in the last 168 hours.  Coagulation Studies: No results for input(s): LABPROT, INR in the last 72 hours.   Imaging   DG Chest Port 1 View  Result Date: 12/04/2021 CLINICAL DATA:  Shortness of breath EXAM: PORTABLE CHEST 1 VIEW COMPARISON:  08/24/2016 FINDINGS: Dual-chamber ICD/pacer leads from the left in stable position. Generous heart size and vascular pedicle width accentuated by low volume portable technique. There is no edema, consolidation, effusion, or  pneumothorax. IMPRESSION: No evidence of acute disease. Electronically Signed   By: Tiburcio Pea M.D.   On: 12/04/2021 11:13     Medications:     Current Medications:  ondansetron       etomidate       fentaNYL       ketamine HCl       midazolam       rocuronium bromide       succinylcholine        Infusions:  lactated ringers     norepinephrine (LEVOPHED) Adult infusion 10 mcg/min (12/04/21 1121)      Patient Profile   56 y.o. male with history of cardiomyopathy s/p ICD, paroxysmal atrial fibrillation, ETOH abuse, HTN. Admitted with acute respiratory failure with hypoxia and shock.  Assessment/Plan   Shock -Etiology uncertain. Presented with worsening dyspnea X 2 weeks.  -Now requiring Epi, NE, and Vaso to maintain MAP > 65 -CCM placing central line and arterial line -Follow CVP and CO-OX. May eventually need inotrope support -Awaiting lactic acid, ABG, BNP UDS, BC X 2 -Initial Tn 12, trend -Cardiac history not clear. Has presumed HCM and hx ICD. Interrogate ICD to rule out arrhythmias -Await echo - appears to have significant MR as well as RV dysfunction on preliminary review  2. Cardiomyopathy, suspected hx HCM -Echo 2017: EF 65-70%, severe focal basal and moderate concentric LVH -Has ICD -Followed at [facility] in [place]  3. Atrial fibrillation -Had cardioversion 10/22 -AF noted today, unclear when recurred. Device check as above -Heparin gtt -On Eliquis PTA  4. ETOH abuse -Per daughter consumes alcohol daily  5. Acute respiratory failure with hypoxia -Emergently intubated in ED today -Vent management per CCM -No acute findings on CXR    Length of Stay: 0  FINCH, LINDSAY N, PA-C  12/04/2021, 11:32 AM  Advanced Heart Failure Team Pager [phone] (M-F; 7a - 5p)  Please contact [facility] for night-coverage after hours (4p -7a ) and weekends on [email or web address]   Patient seen with PA, agree with the above note.    History as noted above.  Currently,  patient is intubated/sedated.  He is on norepinephrine 40, epinephrine 12, and vasopressin 0.03 currently with MAP around 70.  CVP 26.    I reviewed his echo at bedside.  EF appears 45-50% range with moderate asymmetric septal hypertrophy and lateral hypokinesis, no SAM or LVOT gradient, trileaflet aortic valve with no stenosis or regurgitation, Mildly dilated and mildly dysfunctional RV, severe MR (ERO 0.4 cm^2 by PISA) with poor leaflet coaptation though LV is not dilated (posterior leaflet restricted, ?infarct-related MR), dilated IVC.  MDT device interrogated, device is at [facility], he has been in atrial fibrillation since 3/23.  No VT/ICD shock.   General: Intubated/sedated Neck: JVP 16+, no thyromegaly or thyroid nodule.  Lungs: Clear to auscultation bilaterally with normal respiratory effort. CV: Nondisplaced PMI.  Heart irregular S1/S2, no S3/S4, 2/6 HSM apex.  1+ edema to knees.  No carotid bruit.  Normal pedal pulses.  Abdomen: Soft, nontender, no hepatosplenomegaly, no distention.  Skin: Intact without lesions or rashes.  Neurologic: Alert and oriented x 3.  Psych: Normal affect. Extremities: No clubbing or cyanosis.  HEENT: Normal.    1. Shock: Suspect component of cardiogenic shock but cannot rule out septic shock.  No clear source of infection.  - Currently on norepinephrine 40, epinephrine 12, and vasopressin 0.03.  - Sending co-ox, can add dobutamine if low (would avoid milrinone while MAP is marginal). - Sending procalcitonin, covering with vancomycin/cefepime.  - Doubt PE, he has been anticoagulated.  2. 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 Echo this admission with EF 45-50% range with moderate asymmetric septal hypertrophy and lateral hypokinesis, no SAM or LVOT gradient, trileaflet aortic valve with no stenosis or regurgitation, Mildly dilated and mildly dysfunctional RV, severe MR (ERO 0.4 cm^2 by  PISA) with poor leaflet coaptation though LV is not dilated (posterior leaflet restricted, ?infarct-related MR with anterolateral HK), dilated IVC. Severe MR and RV dysfunction likely contribute to shock. CVP 26, marked volume overload.  HS-TnI is only 12, unlikely to be ACS.   - Cycle troponin.  - Pressor support as above and send co-ox => 45%, will add dobutamine 2.5 mcg/kg/min (hold off on milrinone given high pressor requirement).  - Once pressure is stabilized (later this afternoon), would challenge with Lasix 120 mg IV x 1.  3. ?HCM: Patient per prior notes has had a "thick heart" and it appears that ICD was placed remotely due to recurrent syncope.  ICD is at [facility], needs generator change.  4.  Atrial fibrillation: Paroxysmal.  Patient has been on Eliquis.  He is in AF today and on device check, has been in AF since 3/23.   - Continue anticoagulation, use heparin gtt for now.  - Would aim for DCCV prior to discharge.  5. ID: ?Septic shock component but no definite source for infection.  - Empiric vancomycin/cefepime 6. Mitral regurgitation: Severe. The posterior leaflet is restricted and the anterolateral wall is hypokinetic, ?infarct-related MR.  HS-troponin only 12 so no ACS apparent this admission.   - Will need TEE when more stable.  - Cycle troponin.  7. Acute hypoxemic respiratory failure: Patient is now intubated, suspect primarily pulmonary edema.  8. ETOH abuse: Heavy drinker per daughter.  9. AKI on  CKD stage 3: Baseline creatinine appears to be around 1.6.  Up to 2 today with shock.   Marca Ancona 12/04/2021 2:04 PM

## 2021-12-04 NOTE — ED Notes (Signed)
X-ray at bedside

## 2021-12-04 NOTE — H&P (Signed)
? ?NAME:  Glenn [name], MRN:  [id number], DOB:  1965/09/04, LOS: 0 ?ADMISSION DATE:  12/04/2021, CONSULTATION DATE:  4/20 ?REFERRING MD:  Dr. Sabra Heck, CHIEF COMPLAINT:  Shock; Acute respiratory failure requiring intubation ? ?History of Present Illness:  ?Patient is a 56 year old male with pertinent PMH of A-fib on eliquis, cardiomyopathy with ICD placement 2013, HTN, HLD, heavy alcohol use according to family presents to [facility] ED on 4/20 with respiratory distress and hypotension. ? ?On 4/20 patient developed acute onset severe SOB.  Having generalized fatigue/weakness.  Became diaphoretic.  Patient presents to [facility] ED on 4/20 with increased SOB.  Patient noted to be hypotensive with systolic BP 123456.  Patient arrived on NRB.  Patient noted to be in A-fib with rates 120s and ectopic PVCs.  Patient given IV fluids and started on norepinephrine.  Initially started on BiPAP but patient vomited.  Patient was then intubated and placed on Mech vent.  CXR showing ETT 6 cm above carina, cardiomegaly, no significant signs of edema/consolidation.  K4.9, CO2 19, glucose 218, creatinine 1.92, WBC 7.5, Hgb 13.9, troponin 12.  BNP, ABG, UA, BC x2, COVID/flu pending cardiology consulted.  PCCM consulted for ICU admission. ? ?Pertinent  Medical History  ? ?Past Medical History:  ?Diagnosis Date  ? A-fib ([place]) 09/29/15  ? Cardiomyopathy ([place])   ? Dysrhythmia   ? Enlarged prostate   ? Gout   ? Hypertension   ? ? ? ?Significant Hospital Events: ?Including procedures, antibiotic start and stop dates in addition to other pertinent events   ?4/20: Patient admitted to [facility] in shock; on levo; intubated ? ?Interim History / Subjective:  ?As above ? ?Objective   ?Blood pressure 121/82, pulse 87, temperature 99.1 ?F (37.3 ?C), temperature source Rectal, resp. rate (!) 50, height 6' (1.829 m), weight 104.3 kg, SpO2 99 %. ?   ?Vent Mode: PRVC ?FiO2 (%):  [40 %-100 %] 100 % ?Set Rate:  [14 bmp-20 bmp] 20 bmp ?Vt Set:  [620 mL] 620 mL ?PEEP:  [5 cmH20-6  cmH20] 5 cmH20 ?Pressure Support:  [10 cmH20] 10 cmH20 ?Plateau Pressure:  [21 cmH20] 21 cmH20  ? ?Intake/Output Summary (Last 24 hours) at 12/04/2021 1153 ?Last data filed at 12/04/2021 1143 ?Gross per 24 hour  ?Intake 16.08 ml  ?Output --  ?Net 16.08 ml  ? ?Filed Weights  ? 12/04/21 1106  ?Weight: 104.3 kg  ? ? ?Examination: ? ?General:  In bed on vent ?HENT: NCAT ETT in place ?PULM: mild rhonchi bilaterally B, vent supported breathing ?CV: RRR, no mgr ?GI: BS+, soft, nontender ?MSK: normal bulk and tone ?Neuro: sedated on vent ? ?Resolved Hospital Problem list   ? ? ?Assessment & Plan:  ?Shock: Cardiogenic shock, complicated by severe MR; ddx includes sepsis vs PE objective data at this point doesn't support either of those diagnoses ?P: ?-admit to icu ?-levophed, vasoprssin and epinephrine infusion for MAP goal >65 ?-will place central/arterial line ?-check cvp/coox post central line placement> will need inotrops ?-cards consulted; appreciate recs ?-trend troponin and lactate ?-bnp pending ?-check Mag; replete electrolytes as needed ?-needs echo ?-send UDS, ethanol level ?-check cultures: bcx2, urine culture/UA, and trach culture ?-will start on broad spectrum antibiotics ?-check procalcitonin ? ?Acute respiratory failure with hypoxia ?P: ?-LTVV strategy with tidal volumes of 6-8 cc/kg ideal body weight ?-CXR showing ETT 6 cm above carina; will insert 2 cm ?-check ABG and adjust settings accordingly  ?-Goal plateau pressures less than 30 and driving pressures less than 15 ?-Wean PEEP/FiO2  for SpO2 >92% ?-VAP bundle in place ?-Daily SAT and SBT ?-PAD protocol in place ?-wean sedation for RASS goal 0 to -1 ?-Follow intermittent CXR and ABG PRN ? ?AKI ?P: ?-Trend BMP / urinary output ?-Replace electrolytes as indicated ?-Avoid nephrotoxic agents, ensure adequate renal perfusion ? ?Metabolic acidosis due to cardiogenic shock ?P: ?-check lactic acid ?-give sodium bicarbonate amps now ? ?Hyperglycemia ?P: ?-ssi and cbg  monitoring ?-check a1c ? ?Hx of afib: on eliquis ?Hx of cardiomyopathy s/p ICD placed 2013 ?Hx of HTN ?Hx of HLD ?P: ?-telemetry monitoring ?-hold home anti-hypertensive's in setting of hypotension ?-heparin gtt ?-continue statin ? ?Hx of depression? ?P: ?-hold home wellbutrin ? ?Hx of BPH? ?P: ?-hold home terazosin and tamsulosin ? ?EtOH abuse ?P: ?-thiamine ?-folate ?-monitor for withdrawal ? ? ?Best Practice (right click and "Reselect all SmartList Selections" daily)  ? ?Diet/type: NPO w/ meds via tube ?DVT prophylaxis: systemic heparin ?GI prophylaxis: PPI ?Lines: Central line and Arterial Line ?Foley:  N/A ?Code Status:  full code ?Last date of multidisciplinary goals of care discussion [pending] ? ?Labs   ?CBC: ?Recent Labs  ?Lab 12/04/21 ?1054 12/04/21 ?1129  ?WBC 7.5  --   ?NEUTROABS 4.4  --   ?HGB 13.9 13.9  ?HCT 42.2 41.0  ?MCV 95.7  --   ?PLT 209  --   ? ? ?Basic Metabolic Panel: ?Recent Labs  ?Lab 12/04/21 ?1129  ?NA 140  ?K 4.8  ?CL 111  ?GLUCOSE 210*  ?BUN 18  ?CREATININE 2.00*  ? ?GFR: ?Estimated Creatinine Clearance: 50.9 mL/min (A) (by C-G formula based on SCr of 2 mg/dL (H)). ?Recent Labs  ?Lab 12/04/21 ?1054  ?WBC 7.5  ? ? ?Liver Function Tests: ?No results for input(s): AST, ALT, ALKPHOS, BILITOT, PROT, ALBUMIN in the last 168 hours. ?No results for input(s): LIPASE, AMYLASE in the last 168 hours. ?No results for input(s): AMMONIA in the last 168 hours. ? ?ABG ?   ?Component Value Date/Time  ? TCO2 21 (L) 12/04/2021 1129  ?  ? ?Coagulation Profile: ?No results for input(s): INR, PROTIME in the last 168 hours. ? ?Cardiac Enzymes: ?No results for input(s): CKTOTAL, CKMB, CKMBINDEX, TROPONINI in the last 168 hours. ? ?HbA1C: ?Hgb A1c MFr Bld  ?Date/Time Value Ref Range Status  ?09/29/2015 04:19 PM 5.4 4.8 - 5.6 % Final  ?  Comment:  ?  (NOTE) ?        Pre-diabetes: 5.7 - 6.4 ?        Diabetes: >6.4 ?        Glycemic control for adults with diabetes: <7.0 ?  ? ? ?CBG: ?No results for input(s): GLUCAP  in the last 168 hours. ? ?Review of Systems:   ?Patient is encephalopathic and/or intubated. Therefore history has been obtained from chart review.  ? ? ?Past Medical History:  ?He,  has a past medical history of A-fib ([place]) (09/29/15), Cardiomyopathy ([place]), Dysrhythmia, Enlarged prostate, Gout, and Hypertension.  ? ?Surgical History:  ? ?Past Surgical History:  ?Procedure Laterality Date  ? CARDIAC DEFIBRILLATOR PLACEMENT  2013  ? [employer]onic Evera dual chamber ICD implanted  by Dr Coy Saunas at [facility]  ?  ? ?Social History:  ? reports that he has never smoked. He has never used smokeless tobacco. He reports current alcohol use. He reports that he does not use drugs.  ? ?Family History:  ?His family history includes Diabetes in his mother; Hypertension in his father and mother.  ? ?Allergies ?Allergies  ?Allergen Reactions  ?  Ace Inhibitors Swelling  ?  Swelling lips  ? Penicillins Other (See Comments)  ?  Childhood allergy  ? Thiazide-Type Diuretics   ?  ? ?Home Medications  ?Prior to Admission medications   ?Medication Sig Start Date End Date Taking? Authorizing Provider  ?apixaban (ELIQUIS) 5 MG TABS tablet Take 5 mg by mouth 2 (two) times daily.    [provider]  ?atorvastatin (LIPITOR) 80 MG tablet Take 80 mg by mouth daily.    [provider]  ?benzonatate (TESSALON) 100 MG capsule Take 1 capsule (100 mg total) by mouth 2 (two) times daily as needed for cough. 07/08/21   Montine Circle, PA-C  ?buPROPion (WELLBUTRIN XL) 300 MG 24 hr tablet Take 300 mg by mouth daily.    [provider]  ?carvedilol (COREG) 25 MG tablet Take 25 mg by mouth 2 (two) times daily with a meal.    [provider]  ?cholecalciferol (VITAMIN D) 1000 UNITS tablet Take 1,000 Units by mouth daily.    [provider]  ?diltiazem (CARDIZEM CD) 240 MG 24 hr capsule Take 1 capsule (240 mg total) by mouth daily. 09/30/15   Baldwin Jamaica, PA-C  ?levofloxacin (LEVAQUIN) 750 MG tablet Take 1 tablet  (750 mg total) by mouth daily. X 7 days 07/01/18   Charlesetta Shanks, MD  ?loperamide (IMODIUM) 2 MG capsule Take 2 mg by mouth as needed for diarrhea or loose stools (q4 hours as needed for diarrhea)

## 2021-12-04 NOTE — Procedures (Signed)
Intubation Procedure Note ? ?[name]  ?[id number]  ?Nov 03, 1965 ? ?Date:12/04/21  ?Time:1:09 PM  ? ?Provider Performing:Brent Lakiah Dhingra  ? ? ?Procedure: Endotracheal tube exchange ? ?Indication(s) ?Respiratory Failure, ETT cuff blown ? ?Consent ?Unable to obtain consent due to emergent nature of procedure. ? ? ?Anesthesia ?Fentanyl and Rocuronium ? ? ?Time Out ?Verified patient identification, verified procedure, site/side was marked, verified correct patient position, special equipment/implants available, medications/allergies/relevant history reviewed, required imaging and test results available. ? ? ?Sterile Technique ?Usual hand hygeine, masks, and gloves were used ? ? ?Procedure Description ?Patient positioned in bed supine.  Sedation given as noted above.  Patient was intubated with endotracheal tube using tube exchanger.  View was no view, used ETT exchanger. Number of attempts was 1.  Colorimetric CO2 detector was consistent with tracheal placement. ? ? ?Complications/Tolerance ?None; patient tolerated the procedure well. ?Chest X-ray is ordered to verify placement. ? ? ?EBL ?Minimal ? ? ?Specimen(s) ?None ? ?Heber , MD ?[facility] ?Pager: [phone] ?Cell: [phone] ?After 7:00 pm call Elink  [phone] ? ?

## 2021-12-04 NOTE — ED Notes (Signed)
Pt now being intubated prior to transfer to Tra C ?

## 2021-12-04 NOTE — Progress Notes (Signed)
RT transported patient from trauma C to [facility] with RN. No complications. RT will continue to monitor.  ?

## 2021-12-04 NOTE — Progress Notes (Signed)
RT transported patient from 2H11 to CT and back with RN. No complications. RT will continue to monitor.  

## 2021-12-04 NOTE — ED Notes (Signed)
Bipap removed d/t pt vomiting. Pt suctioned and sat up in bed ?

## 2021-12-04 NOTE — Progress Notes (Signed)
Responded to page to continue support. Pt. Is intubated. Dr. Has spoken with Daughter. Other family presence supporting daughter.  Chaplain available as needed. ? ? ?Jaclynn Major [place], [facility], Pager [phone]  ?

## 2021-12-04 NOTE — Progress Notes (Signed)
RT advanced ETT 2cm per MD order. ETT was 26@lip , ETT is now 28@lip . No complications. RT will continue to monitor.  ?

## 2021-12-04 NOTE — Progress Notes (Addendum)
ANTICOAGULATION CONSULT NOTE - Initial Consult ? ?Pharmacy Consult for Heparin ?Indication: VTE prophylaxis ? ?Allergies  ?Allergen Reactions  ? Ace Inhibitors Swelling  ?  Swelling lips  ? Penicillins Other (See Comments)  ?  Childhood allergy  ? Thiazide-Type Diuretics   ? ? ?Patient Measurements: ?Height: 6' (182.9 cm) ?Weight: 104.3 kg (230 lb) ?IBW/kg (Calculated) : 77.6 ?Heparin Dosing Weight: 99.2 kg ? ?Vital Signs: ?Temp: 99.1 ?F (37.3 ?C) (04/20 1109) ?Temp Source: Rectal (04/20 1109) ?BP: 138/57 (04/20 1205) ?Pulse Rate: 74 (04/20 1205) ? ?Labs: ?Recent Labs  ?  12/04/21 ?1054 12/04/21 ?1129  ?HGB 13.9 13.9  ?HCT 42.2 41.0  ?PLT 209  --   ?CREATININE 1.92* 2.00*  ?TROPONINIHS 12  --   ? ? ?Estimated Creatinine Clearance: 50.9 mL/min (A) (by C-G formula based on SCr of 2 mg/dL (H)). ? ? ?Medical History: ?Past Medical History:  ?Diagnosis Date  ? A-fib ([facility]) 09/29/15  ? Cardiomyopathy ([facility])   ? Dysrhythmia   ? Enlarged prostate   ? Gout   ? Hypertension   ? ? ?Medications:  ?Scheduled:  ? etomidate      ? midazolam  2 mg Intravenous Once  ? ondansetron      ? rocuronium  100 mg Intravenous Once  ? sodium bicarbonate  100 mEq Intravenous Once  ? succinylcholine      ? ? ?Assessment: ?38 YOM presented with acute onset, severe SOB and hypotensive. History of chronic cardiomyopathy on Eliquis with a pacemaker defibrillator placed, history of hypercholesterolemia, history of hypertension and a heavy daily alcohol drinker. CBC stable, no signs of bleeding. ? ?Baseline heparin level and aptt in process ? ?Goal of Therapy:  ?Heparin level 0.3-0.7 units/ml ?aPTT 66-102 seconds ?Monitor platelets by anticoagulation protocol: Yes ?  ?Plan:  ?Heparin drip at 1450 units/hr to start @2200  ?Heparin level with am labs ?Daily heparin level and CBC ordered ?Monitor for signs/symptoms of bleed ? ?Thank you for allowing pharmacy to be a part of this patient?s care. ? ? , PharmD ?Clinical Pharmacist ? ?Please check  AMION for all [facility] Pharmacy numbers ?After 10:00 PM, call Main Pharmacy [phone] ? ? ? ? ?

## 2021-12-04 NOTE — Progress Notes (Signed)
Patient's tube was exchanged by MD due to blown cuff. Color change was noted. Patient was receiving set tidal volumes on vent. RT, PA, and MD completed the procedure at bedside without complications. ?

## 2021-12-04 NOTE — ED Notes (Signed)
Assisted getting pt hooked to the monitor.  ?

## 2021-12-04 NOTE — Sedation Documentation (Signed)
Cards at bedside

## 2021-12-04 NOTE — Procedures (Signed)
Arterial Catheter Insertion Procedure Note ? ?[name]  ?[id number]  ?09/15/1965 ? ?Date:12/04/21  ?Time:2:04 PM  ? ? ?Provider Performing: Rutherford Guys  ? ? ?Procedure: Insertion of Arterial Line (25053) with US guidance (97673)  ? ?Indication(s) ?Blood pressure monitoring and/or need for frequent ABGs ? ?Consent ?Unable to obtain consent due to emergent nature of procedure. ? ?Anesthesia ?None ? ? ?Time Out ?Verified patient identification, verified procedure, site/side was marked, verified correct patient position, special equipment/implants available, medications/allergies/relevant history reviewed, required imaging and test results available. ? ? ?Sterile Technique ?Maximal sterile technique including full sterile barrier drape, hand hygiene, sterile gown, sterile gloves, mask, hair covering, sterile ultrasound probe cover (if used). ? ? ?Procedure Description ?Area of catheter insertion was cleaned with chlorhexidine and draped in sterile fashion. With real-time ultrasound guidance an arterial catheter was placed into the right femoral artery.  Appropriate arterial tracings confirmed on monitor.   ? ? ?Complications/Tolerance ?None; patient tolerated the procedure well. ? ? ?EBL ?Minimal ? ? ?Specimen(s) ?None ? ? ?Rutherford Guys, PA - C ?[facility] ?For pager details, please see AMION or use Epic chat  ?After 1900, please call [facility] for cross coverage needs ?12/04/2021, 2:05 PM ? ?

## 2021-12-04 NOTE — ED Notes (Signed)
XRAY called for portable chest xray ASAP ?

## 2021-12-04 NOTE — ED Provider Notes (Signed)
?[place] ?Provider Note ? ? ?CSN: [id number] ?Arrival date & time: 12/04/21  1050 ? ?  ? ?History ? ?Chief Complaint  ?Patient presents with  ? Shortness of Breath  ? Hypotension  ? ? ?Glenn [name] is a 56 y.o. male. ? ? ?Shortness of Breath ? ?This patient is a 56 year old male, has a history of chronic cardiomyopathy on Eliquis with a pacemaker defibrillator placed, history of hypercholesterolemia, history of hypertension and a heavy daily alcohol drinker according to his daughter who is the primary historian. ? ?Reportedly according to the patient and his daughter he had driven his grandchildren to school today, he came home and had acute onset of severe shortness of breath.  Evidently he has had some generalized fatigue and weakness recently and excessive tiredness but today he had severe shortness of breath and presents diaphoretic unable to speak in more than 1 or 2 word sentences.  The daughter states he does not take any other drugs such as cocaine, he does drink multiple liquor drinks a day but has not been drinking this morning.  He has a history of following with the [facility] and was told recently that his pacemaker needed to be replaced secondary to the battery becoming very low. ? ?Home Medications ?Prior to Admission medications   ?Medication Sig Start Date End Date Taking? Authorizing Provider  ?apixaban (ELIQUIS) 5 MG TABS tablet Take 5 mg by mouth 2 (two) times daily.    [provider]  ?atorvastatin (LIPITOR) 80 MG tablet Take 80 mg by mouth daily.    [provider]  ?benzonatate (TESSALON) 100 MG capsule Take 1 capsule (100 mg total) by mouth 2 (two) times daily as needed for cough. 07/08/21   Montine Circle, PA-C  ?buPROPion (WELLBUTRIN XL) 300 MG 24 hr tablet Take 300 mg by mouth daily.    [provider]  ?carvedilol (COREG) 25 MG tablet Take 25 mg by mouth 2 (two) times daily with a meal.    [provider]   ?cholecalciferol (VITAMIN D) 1000 UNITS tablet Take 1,000 Units by mouth daily.    [provider]  ?diltiazem (CARDIZEM CD) 240 MG 24 hr capsule Take 1 capsule (240 mg total) by mouth daily. 09/30/15   Baldwin Jamaica, PA-C  ?levofloxacin (LEVAQUIN) 750 MG tablet Take 1 tablet (750 mg total) by mouth daily. X 7 days 07/01/18   Charlesetta Shanks, MD  ?loperamide (IMODIUM) 2 MG capsule Take 2 mg by mouth as needed for diarrhea or loose stools (q4 hours as needed for diarrhea).    [provider]  ?losartan (COZAAR) 100 MG tablet Take 100 mg by mouth daily.    [provider]  ?magnesium oxide (MAG-OX) 400 MG tablet Take 400 mg by mouth daily.    [provider]  ?meclizine (ANTIVERT) 12.5 MG tablet Take 1 tablet (12.5 mg total) by mouth 3 (three) times daily as needed for dizziness. 01/07/21   Hans Eden, NP  ?omeprazole (PRILOSEC) 20 MG capsule Take 20 mg by mouth daily.    [provider]  ?ondansetron (ZOFRAN ODT) 4 MG disintegrating tablet Take 1 tablet (4 mg total) by mouth every 4 (four) hours as needed for nausea or vomiting. 07/01/18   Charlesetta Shanks, MD  ?oseltamivir (TAMIFLU) 75 MG capsule Take 1 capsule (75 mg total) by mouth every 12 (twelve) hours. 07/08/21   Montine Circle, PA-C  ?spironolactone (ALDACTONE) 25 MG tablet Take 0.5 tablets (12.5 mg  total) by mouth daily. 09/30/15   Baldwin Jamaica, PA-C  ?tamsulosin (FLOMAX) 0.4 MG CAPS capsule Take 1 capsule (0.4 mg total) by mouth daily. 07/01/18   Charlesetta Shanks, MD  ?terazosin (HYTRIN) 5 MG capsule Take 10 mg by mouth at bedtime.     [provider]  ?terbinafine (LAMISIL) 1 % cream Apply 1 application topically 2 (two) times daily.    [provider]  ?   ? ?Allergies    ?Ace inhibitors, Penicillins, and Thiazide-type diuretics   ? ?Review of Systems   ?Review of Systems  ?Unable to perform ROS: Acuity of condition  ?Respiratory:  Positive for shortness of breath.   ? ?Physical  Exam ?Updated Vital Signs ?BP 105/77   Pulse 67   Temp 99.1 ?F (37.3 ?C) (Rectal)   Resp (!) 21   Ht 1.829 m (6')   Wt 104.3 kg   SpO2 100%   BMI 31.19 kg/m?  ?Physical Exam ?Constitutional:   ?   Comments: Ill-appearing, somnolent but arousable to voice, severely diaphoretic  ?HENT:  ?   Head: Normocephalic and atraumatic.  ?   Mouth/Throat:  ?   Pharynx: No pharyngeal swelling or oropharyngeal exudate.  ?Eyes:  ?   Extraocular Movements: Extraocular movements intact.  ?   Pupils: Pupils are equal, round, and reactive to light.  ?Neck:  ?   Comments: No obvious jugular venous distention ?Cardiovascular:  ?   Comments: Atrial fibrillation with occasional PVCs, slightly distant heart sounds, severely weak peripheral pulses, carotid pulses are palpable ?Pulmonary:  ?   Effort: Tachypnea present.  ?Abdominal:  ?   Palpations: Abdomen is soft.  ?   Tenderness: There is no abdominal tenderness.  ?Musculoskeletal:  ?   Cervical back: Normal range of motion and neck supple.  ?   Right lower leg: No edema.  ?   Left lower leg: No edema.  ?Skin: ?   General: Skin is warm.  ?   Findings: No rash.  ?Neurological:  ?   Comments: The patient cannot even sit up in bed due to severe shortness of breath, generally weak diffusely, able to wake up and follow some simple commands  ? ? ?ED Results / Procedures / Treatments   ?Labs ?(all labs ordered are listed, but only abnormal results are displayed) ?Labs Reviewed  ?BASIC METABOLIC PANEL - Abnormal; Notable for the following components:  ?    Result Value  ? Chloride 114 (*)   ? CO2 19 (*)   ? Glucose, Bld 218 (*)   ? Creatinine, Ser 1.92 (*)   ? GFR, Estimated 40 (*)   ? All other components within normal limits  ?I-STAT CHEM 8, ED - Abnormal; Notable for the following components:  ? Creatinine, Ser 2.00 (*)   ? Glucose, Bld 210 (*)   ? TCO2 21 (*)   ? All other components within normal limits  ?RESP PANEL BY RT-PCR (FLU A&B, COVID) [id number]  ?CULTURE, BLOOD (ROUTINE X 2)   ?CULTURE, BLOOD (ROUTINE X 2)  ?CBC WITH DIFFERENTIAL/PLATELET  ?BRAIN NATRIURETIC PEPTIDE  ?URINALYSIS, ROUTINE W REFLEX MICROSCOPIC  ?TROPONIN I (HIGH SENSITIVITY)  ? ? ?EKG ?None ? ?Radiology ?DG Chest Port 1 View ? ?Result Date: 12/04/2021 ?CLINICAL DATA:  Shortness of breath EXAM: PORTABLE CHEST 1 VIEW COMPARISON:  08/24/2016 FINDINGS: Dual-chamber ICD/pacer leads from the left in stable position. Generous heart size and vascular pedicle width accentuated by low volume portable technique. There is no edema, consolidation, effusion,  or pneumothorax. IMPRESSION: No evidence of acute disease. Electronically Signed   By: Jorje Guild M.D.   On: 12/04/2021 11:13   ? ?Procedures ?Marland KitchenCritical Care ?Performed by: Noemi Chapel, MD ?Authorized by: Noemi Chapel, MD  ? ?Critical care provider statement:  ?  Critical care time (minutes):  30 ?  Critical care time was exclusive of:  Separately billable procedures and treating other patients and teaching time ?  Critical care was necessary to treat or prevent imminent or life-threatening deterioration of the following conditions:  Cardiac failure ?  Critical care was time spent personally by me on the following activities:  Development of treatment plan with patient or surrogate, discussions with consultants, evaluation of patient's response to treatment, examination of patient, ordering and review of laboratory studies, ordering and review of radiographic studies, ordering and performing treatments and interventions, pulse oximetry, re-evaluation of patient's condition, review of old charts and obtaining history from patient or surrogate ?  I assumed direction of critical care for this patient from another provider in my specialty: no   ?  Care discussed with: admitting provider   ?Comments:  ?    ? ?Procedure Name: Intubation ?Date/Time: 12/04/2021 11:53 AM ?Performed by: Noemi Chapel, MD ?Pre-anesthesia Checklist: Patient identified, Patient being monitored, Emergency  Drugs available, Timeout performed and Suction available ?Oxygen Delivery Method: Ambu bag ?Preoxygenation: Pre-oxygenation with 100% oxygen ?Induction Type: Rapid sequence ?Ventilation: Mask ventilation without dif

## 2021-12-04 NOTE — Progress Notes (Signed)
[facility] ? ?Called to bedside for unequal pupils ?R pupil dilated, oval in shape, left pinpoint ?[facility] ?Family doesn't recall this or note other changes in his pupils or eyes ?Minimally responsive off fentanyl ?Will order CT head, suspect lact of response is due to severity of cardiogenic shock ? ?Heber Darby, MD ?[place] ?Pager: [phone] ?Cell: [phone] ?After 7:00 pm call Elink  [phone] ? ?

## 2021-12-04 NOTE — ED Notes (Signed)
MD at bedside. 

## 2021-12-05 ENCOUNTER — Inpatient Hospital Stay (HOSPITAL_COMMUNITY): Payer: Medicare Other

## 2021-12-05 ENCOUNTER — Other Ambulatory Visit (HOSPITAL_COMMUNITY): Payer: Self-pay

## 2021-12-05 DIAGNOSIS — N179 Acute kidney failure, unspecified: Secondary | ICD-10-CM

## 2021-12-05 DIAGNOSIS — J9601 Acute respiratory failure with hypoxia: Secondary | ICD-10-CM

## 2021-12-05 DIAGNOSIS — I340 Nonrheumatic mitral (valve) insufficiency: Secondary | ICD-10-CM

## 2021-12-05 DIAGNOSIS — I4891 Unspecified atrial fibrillation: Secondary | ICD-10-CM

## 2021-12-05 LAB — GLUCOSE, CAPILLARY
Glucose-Capillary: 113 mg/dL — ABNORMAL HIGH (ref 70–99)
Glucose-Capillary: 115 mg/dL — ABNORMAL HIGH (ref 70–99)
Glucose-Capillary: 126 mg/dL — ABNORMAL HIGH (ref 70–99)
Glucose-Capillary: 128 mg/dL — ABNORMAL HIGH (ref 70–99)
Glucose-Capillary: 129 mg/dL — ABNORMAL HIGH (ref 70–99)
Glucose-Capillary: 135 mg/dL — ABNORMAL HIGH (ref 70–99)
Glucose-Capillary: 258 mg/dL — ABNORMAL HIGH (ref 70–99)
Glucose-Capillary: 90 mg/dL (ref 70–99)

## 2021-12-05 LAB — COMPREHENSIVE METABOLIC PANEL
ALT: 51 U/L — ABNORMAL HIGH (ref 0–44)
AST: 59 U/L — ABNORMAL HIGH (ref 15–41)
Albumin: 3.3 g/dL — ABNORMAL LOW (ref 3.5–5.0)
Alkaline Phosphatase: 49 U/L (ref 38–126)
Anion gap: 12 (ref 5–15)
BUN: 22 mg/dL — ABNORMAL HIGH (ref 6–20)
CO2: 17 mmol/L — ABNORMAL LOW (ref 22–32)
Calcium: 8.8 mg/dL — ABNORMAL LOW (ref 8.9–10.3)
Chloride: 113 mmol/L — ABNORMAL HIGH (ref 98–111)
Creatinine, Ser: 2.32 mg/dL — ABNORMAL HIGH (ref 0.61–1.24)
GFR, Estimated: 32 mL/min — ABNORMAL LOW (ref >60)
Glucose, Bld: 140 mg/dL — ABNORMAL HIGH (ref 70–99)
Potassium: 3.5 mmol/L (ref 3.5–5.1)
Sodium: 142 mmol/L (ref 135–145)
Total Bilirubin: 2.8 mg/dL — ABNORMAL HIGH (ref 0.3–1.2)
Total Protein: 6.3 g/dL — ABNORMAL LOW (ref 6.5–8.1)

## 2021-12-05 LAB — CALCIUM, IONIZED: Calcium, Ionized, Serum: 4.1 mg/dL — ABNORMAL LOW (ref 4.5–5.6)

## 2021-12-05 LAB — URINALYSIS, ROUTINE W REFLEX MICROSCOPIC
Bacteria, UA: NONE SEEN
Bilirubin Urine: NEGATIVE
Glucose, UA: NEGATIVE mg/dL
Ketones, ur: NEGATIVE mg/dL
Leukocytes,Ua: NEGATIVE
Nitrite: NEGATIVE
Protein, ur: 100 mg/dL — AB
Specific Gravity, Urine: 1.015 (ref 1.005–1.030)
pH: 5 (ref 5.0–8.0)

## 2021-12-05 LAB — BASIC METABOLIC PANEL
Anion gap: 12 (ref 5–15)
BUN: 20 mg/dL (ref 6–20)
CO2: 18 mmol/L — ABNORMAL LOW (ref 22–32)
Calcium: 8.7 mg/dL — ABNORMAL LOW (ref 8.9–10.3)
Chloride: 113 mmol/L — ABNORMAL HIGH (ref 98–111)
Creatinine, Ser: 2.45 mg/dL — ABNORMAL HIGH (ref 0.61–1.24)
GFR, Estimated: 30 mL/min — ABNORMAL LOW (ref >60)
Glucose, Bld: 97 mg/dL (ref 70–99)
Potassium: 3.7 mmol/L (ref 3.5–5.1)
Sodium: 143 mmol/L (ref 135–145)

## 2021-12-05 LAB — CBC
HCT: 43.9 % (ref 39.0–52.0)
Hemoglobin: 15.1 g/dL (ref 13.0–17.0)
MCH: 31.2 pg (ref 26.0–34.0)
MCHC: 34.4 g/dL (ref 30.0–36.0)
MCV: 90.7 fL (ref 80.0–100.0)
Platelets: 207 10*3/uL (ref 150–400)
RBC: 4.84 MIL/uL (ref 4.22–5.81)
RDW: 13.9 % (ref 11.5–15.5)
WBC: 12.5 10*3/uL — ABNORMAL HIGH (ref 4.0–10.5)
nRBC: 0 % (ref 0.0–0.2)

## 2021-12-05 LAB — APTT
aPTT: 164 seconds (ref 24–36)
aPTT: 156 seconds — ABNORMAL HIGH (ref 24–36)

## 2021-12-05 LAB — PHOSPHORUS: Phosphorus: 1.6 mg/dL — ABNORMAL LOW (ref 2.5–4.6)

## 2021-12-05 LAB — URINE CULTURE: Culture: NO GROWTH

## 2021-12-05 LAB — COOXEMETRY PANEL
Carboxyhemoglobin: 1.2 % (ref 0.5–1.5)
Methemoglobin: <0.7 % (ref 0.0–1.5)
O2 Saturation: 72.2 %
Total hemoglobin: 14.1 g/dL (ref 12.0–16.0)

## 2021-12-05 LAB — TRIGLYCERIDES: Triglycerides: 41 mg/dL (ref <150)

## 2021-12-05 LAB — HEPARIN LEVEL (UNFRACTIONATED): Heparin Unfractionated: >1.1 IU/mL — ABNORMAL HIGH (ref 0.30–0.70)

## 2021-12-05 LAB — MAGNESIUM: Magnesium: 1.2 mg/dL — ABNORMAL LOW (ref 1.7–2.4)

## 2021-12-05 LAB — VANCOMYCIN, RANDOM: Vancomycin Rm: 17

## 2021-12-05 LAB — PROCALCITONIN: Procalcitonin: 0.22 ng/mL

## 2021-12-05 MED ORDER — POTASSIUM CHLORIDE 10 MEQ/50ML IV SOLN
10.0000 meq | INTRAVENOUS | Status: DC
  Administered 2021-12-05: 10 meq via INTRAVENOUS

## 2021-12-05 MED ORDER — AMIODARONE LOAD VIA INFUSION
150.0000 mg | Freq: Once | INTRAVENOUS | Status: AC
  Administered 2021-12-05: 150 mg via INTRAVENOUS
  Filled 2021-12-05: qty 83.34

## 2021-12-05 MED ORDER — SODIUM CHLORIDE 0.9 % IV SOLN: INTRAVENOUS | Status: DC

## 2021-12-05 MED ORDER — POTASSIUM PHOSPHATES 15 MMOLE/5ML IV SOLN
30.0000 mmol | Freq: Once | INTRAVENOUS | Status: AC
  Administered 2021-12-05: 30 mmol via INTRAVENOUS
  Filled 2021-12-05: qty 10

## 2021-12-05 MED ORDER — AMIODARONE HCL IN DEXTROSE 360-4.14 MG/200ML-% IV SOLN
30.0000 mg/h | INTRAVENOUS | Status: DC
  Administered 2021-12-05 – 2021-12-07 (×5): 30 mg/h via INTRAVENOUS
  Filled 2021-12-05 (×5): qty 200

## 2021-12-05 MED ORDER — ATORVASTATIN CALCIUM 80 MG PO TABS
80.0000 mg | ORAL_TABLET | Freq: Every day | ORAL | Status: DC
  Administered 2021-12-06 – 2021-12-09 (×4): 80 mg via ORAL
  Filled 2021-12-05 (×4): qty 1

## 2021-12-05 MED ORDER — PANTOPRAZOLE SODIUM 40 MG PO TBEC
40.0000 mg | DELAYED_RELEASE_TABLET | Freq: Every day | ORAL | Status: DC
  Administered 2021-12-06 – 2021-12-09 (×4): 40 mg via ORAL
  Filled 2021-12-05 (×4): qty 1

## 2021-12-05 MED ORDER — PANTOPRAZOLE 2 MG/ML SUSPENSION: 40.0000 mg | Freq: Every day | ORAL | Status: DC

## 2021-12-05 MED ORDER — POTASSIUM CHLORIDE 10 MEQ/50ML IV SOLN
INTRAVENOUS | Status: AC
  Administered 2021-12-05: 10 meq via INTRAVENOUS
  Filled 2021-12-05: qty 50

## 2021-12-05 MED ORDER — AMIODARONE IV BOLUS ONLY 150 MG/100ML
150.0000 mg | Freq: Once | INTRAVENOUS | Status: AC
  Administered 2021-12-05: 150 mg via INTRAVENOUS
  Filled 2021-12-05: qty 100

## 2021-12-05 MED ORDER — AMIODARONE HCL IN DEXTROSE 360-4.14 MG/200ML-% IV SOLN
60.0000 mg/h | INTRAVENOUS | Status: AC
  Administered 2021-12-05 (×3): 60 mg/h via INTRAVENOUS
  Filled 2021-12-05 (×2): qty 200

## 2021-12-05 MED ORDER — MAGNESIUM SULFATE 4 GM/100ML IV SOLN
4.0000 g | Freq: Once | INTRAVENOUS | Status: AC
  Administered 2021-12-05: 4 g via INTRAVENOUS
  Filled 2021-12-05: qty 100

## 2021-12-05 MED ORDER — FOLIC ACID 1 MG PO TABS
1.0000 mg | ORAL_TABLET | Freq: Every day | ORAL | Status: DC
  Administered 2021-12-06 – 2021-12-09 (×4): 1 mg via ORAL
  Filled 2021-12-05 (×4): qty 1

## 2021-12-05 MED ORDER — POTASSIUM CHLORIDE 10 MEQ/50ML IV SOLN
10.0000 meq | INTRAVENOUS | Status: AC
  Administered 2021-12-05: 10 meq via INTRAVENOUS
  Filled 2021-12-05 (×2): qty 50

## 2021-12-05 MED ORDER — PANTOPRAZOLE SODIUM 40 MG IV SOLR
40.0000 mg | INTRAVENOUS | Status: DC
  Administered 2021-12-05: 40 mg via INTRAVENOUS
  Filled 2021-12-05: qty 10

## 2021-12-05 MED ORDER — DIGOXIN 0.25 MG/ML IJ SOLN
0.2500 mg | Freq: Once | INTRAMUSCULAR | Status: AC
  Administered 2021-12-05: 0.25 mg via INTRAVENOUS
  Filled 2021-12-05: qty 2

## 2021-12-05 MED ORDER — HYDRALAZINE HCL 20 MG/ML IJ SOLN
10.0000 mg | INTRAMUSCULAR | Status: AC | PRN
  Administered 2021-12-05 – 2021-12-06 (×2): 10 mg via INTRAVENOUS
  Filled 2021-12-05 (×2): qty 1

## 2021-12-05 MED ORDER — ADULT MULTIVITAMIN W/MINERALS CH
1.0000 | ORAL_TABLET | Freq: Every day | ORAL | Status: DC
  Administered 2021-12-06 – 2021-12-09 (×4): 1 via ORAL
  Filled 2021-12-05 (×4): qty 1

## 2021-12-05 MED ORDER — DOCUSATE SODIUM 100 MG PO CAPS
100.0000 mg | ORAL_CAPSULE | Freq: Two times a day (BID) | ORAL | Status: DC
Start: 2021-12-05 — End: 2021-12-09
  Administered 2021-12-05 – 2021-12-08 (×4): 100 mg via ORAL
  Filled 2021-12-05 (×5): qty 1

## 2021-12-05 MED ORDER — PROPOFOL 1000 MG/100ML IV EMUL
5.0000 ug/kg/min | INTRAVENOUS | Status: DC
  Administered 2021-12-05: 5 ug/kg/min via INTRAVENOUS
  Filled 2021-12-05: qty 100

## 2021-12-05 MED ORDER — MAGNESIUM SULFATE 2 GM/50ML IV SOLN
2.0000 g | Freq: Once | INTRAVENOUS | Status: AC
  Administered 2021-12-05: 2 g via INTRAVENOUS
  Filled 2021-12-05: qty 50

## 2021-12-05 MED ORDER — POLYETHYLENE GLYCOL 3350 17 G PO PACK
17.0000 g | PACK | Freq: Every day | ORAL | Status: DC
  Administered 2021-12-06: 17 g via ORAL
  Filled 2021-12-05: qty 1

## 2021-12-05 MED ORDER — HYDRALAZINE HCL 20 MG/ML IJ SOLN
5.0000 mg | INTRAMUSCULAR | Status: DC | PRN
  Administered 2021-12-05: 5 mg via INTRAVENOUS
  Filled 2021-12-05: qty 1

## 2021-12-05 NOTE — Progress Notes (Signed)
Date and time results received: 12/05/21 0644 ? ? ?Test: PTT ?Critical Value: 164 ? ?Name of Provider Notified: Dr. Lucile Shutters ? ?Orders Received? Or Actions Taken?: MD made aware, will continue to monitor for bleeding.  ?

## 2021-12-05 NOTE — H&P (View-Only) (Signed)
? ? Advanced Heart Failure Rounding Note ? ?PCP-Cardiologist: None  ? ?Subjective:   ?4/20 on multiple pressors. Shock unclear etiology. Given 120 mg IV lasix x1.  ? ?Last night developed A fib RVR with rate 140-160s. Given digoxin and 150 mg amio x1. Continued with rates 150-160s.  ? ?Off Norepi, Epi, and Vaso.  Remains on Dobutamine 2.5 mcg. Hypertensive.  ? ?CO-OX 59% >>pending.  ? ?Awake and intubated.  ? ? ?Objective:   ?Weight Range: ?107.1 kg ?Body mass index is 32.02 kg/m?.  ? ?Vital Signs:   ?Temp:  [96.6 ?F (35.9 ?C)-99.1 ?F (37.3 ?C)] 97.9 ?F (36.6 ?C) (04/21 0732) ?Pulse Rate:  [28-152] 107 (04/21 0732) ?Resp:  [19-50] 35 (04/21 0732) ?BP: (35-139)/(20-95) 139/95 (04/20 1730) ?SpO2:  [95 %-100 %] 100 % (04/21 0732) ?Arterial Line BP: (100-164)/(54-99) 152/94 (04/21 0732) ?FiO2 (%):  [40 %-100 %] 50 % (04/21 0304) ?Weight:  [104.3 kg-107.1 kg] 107.1 kg (04/21 0500) ?Last BM Date :  (PTA) ? ?Weight change: ?Filed Weights  ? 12/04/21 1106 12/05/21 0500  ?Weight: 104.3 kg 107.1 kg  ? ? ?Intake/Output:  ? ?Intake/Output Summary (Last 24 hours) at 12/05/2021 0743 ?Last data filed at 12/05/2021 0600 ?Gross per 24 hour  ?Intake 1656.99 ml  ?Output 2180 ml  ?Net -523.01 ml  ?  ? ? ?Physical Exam  ? CVP 4 ?General: Intubated.  ?HEENT: ETT ?Neck: Supple. JVP difficult to assess . Carotids 2+ bilat; no bruits. No lymphadenopathy or thyromegaly appreciated. ?Cor: PMI nondisplaced. Tachy Irregular rate & rhythm. No rubs, gallops or murmurs. ?Lungs: Clear ?Abdomen: Soft, nontender, nondistended. No hepatosplenomegaly. No bruits or masses. Good bowel sounds. ?Extremities: No cyanosis, clubbing, rash, edema ?Neuro:Intubated. MAE follows commands.  ?GU: Foley  ?Telemetry  ? ?A fib RVR 140-160s with NSVT ? ?EKG  ?  ?N/A  ? ?Labs  ?  ?CBC ?Recent Labs  ?  12/04/21 ?1054 12/04/21 ?1129 12/04/21 ?1340 12/05/21 ?0147  ?WBC 7.5  --  12.0* 12.5*  ?NEUTROABS 4.4  --   --   --   ?HGB 13.9   < > 14.4 15.1  ?HCT 42.2   < > 46.0 43.9   ?MCV 95.7  --  96.0 90.7  ?PLT 209  --  205 207  ? < > = values in this interval not displayed.  ? ?Basic Metabolic Panel ?Recent Labs  ?  12/04/21 ?1340 12/04/21 ?1605 12/05/21 ?0147  ?NA  --  141 143  ?K  --  4.9 3.7  ?CL  --  113* 113*  ?CO2  --  21* 18*  ?GLUCOSE  --  290* 97  ?BUN  --  18 20  ?CREATININE  --  2.48* 2.45*  ?CALCIUM  --  7.6* 8.7*  ?MG 1.6*  --  1.2*  ? ?Liver Function Tests ?No results for input(s): AST, ALT, ALKPHOS, BILITOT, PROT, ALBUMIN in the last 72 hours. ?No results for input(s): LIPASE, AMYLASE in the last 72 hours. ?Cardiac Enzymes ?No results for input(s): CKTOTAL, CKMB, CKMBINDEX, TROPONINI in the last 72 hours. ? ?BNP: ?BNP (last 3 results) ?Recent Labs  ?  12/04/21 ?1054  ?BNP 142.3*  ? ? ?ProBNP (last 3 results) ?No results for input(s): PROBNP in the last 8760 hours. ? ? ?D-Dimer ?No results for input(s): DDIMER in the last 72 hours. ?Hemoglobin A1C ?Recent Labs  ?  12/04/21 ?1340  ?HGBA1C 5.4  ? ?Fasting Lipid Panel ?Recent Labs  ?  12/05/21 ?0147  ?TRIG 41  ? ?Thyroid  Function Tests ?No results for input(s): TSH, T4TOTAL, T3FREE, THYROIDAB in the last 72 hours. ? ?Invalid input(s): FREET3 ? ?Other results: ? ? ?Imaging  ? ? ?CT HEAD WO CONTRAST (5MM) ? ?Result Date: 12/04/2021 ?CLINICAL DATA:  Altered mental status.  Cardiogenic shock. EXAM: CT HEAD WITHOUT CONTRAST TECHNIQUE: Contiguous axial images were obtained from the base of the skull through the vertex without intravenous contrast. RADIATION DOSE REDUCTION: This exam was performed according to the departmental dose-optimization program which includes automated exposure control, adjustment of the mA and/or kV according to patient size and/or use of iterative reconstruction technique. COMPARISON:  None. FINDINGS: Brain: No evidence of acute infarction, hemorrhage, hydrocephalus, extra-axial collection or mass lesion/mass effect. Scattered mild periventricular and subcortical white matter hypodensities are nonspecific, but  favored to reflect chronic microvascular ischemic changes. Vascular: No hyperdense vessel or unexpected calcification. Skull: Normal. Negative for fracture or focal lesion. Sinuses/Orbits: No acute finding. Old right lamina papyracea fracture. Other: None. IMPRESSION: 1. No acute intracranial abnormality. 2. Mild chronic microvascular ischemic changes. Electronically Signed   By: Titus Dubin M.D.   On: 12/04/2021 17:30  ? ?DG CHEST PORT 1 VIEW ? ?Result Date: 12/04/2021 ?CLINICAL DATA:  Central line placement.  Endotracheal tube present. EXAM: PORTABLE CHEST 1 VIEW COMPARISON:  Prior today FINDINGS: A new right jugular central venous catheter is seen with tip overlying the mid portion of the SVC. Endotracheal tube and nasogastric tube remain in appropriate position. Heart size is within normal limits. Pacemaker leads remain in expected position. Both lungs are clear. No evidence of pneumothorax or pleural effusion. IMPRESSION: New right jugular central venous catheter in appropriate position. No evidence of pneumothorax or other acute findings. Electronically Signed   By: Marlaine Hind M.D.   On: 12/04/2021 12:49  ? ?DG Chest Portable 1 View ? ?Result Date: 12/04/2021 ?CLINICAL DATA:  Shortness of breath, hypotension EXAM: PORTABLE CHEST 1 VIEW COMPARISON:  Previous studies including the examination done earlier today FINDINGS: Transverse diameter of heart is increased. Tip of endotracheal tube is 6 cm above the carina. Enteric tube is noted traversing the esophagus. Pacemaker/defibrillator battery is seen in the left infraclavicular region. Central pulmonary vessels are prominent. Part of this may be due to supine position. There are no signs of alveolar pulmonary edema or focal pulmonary consolidation. Left lateral CP angle is not included in the radiograph. There is no evidence of significant pleural effusion or pneumothorax. IMPRESSION: Cardiomegaly. Central pulmonary vessels are prominent without signs of  alveolar pulmonary edema. There is no focal pulmonary consolidation. Electronically Signed   By: Elmer Picker M.D.   On: 12/04/2021 11:58  ? ?DG Chest Port 1 View ? ?Result Date: 12/04/2021 ?CLINICAL DATA:  Shortness of breath EXAM: PORTABLE CHEST 1 VIEW COMPARISON:  08/24/2016 FINDINGS: Dual-chamber ICD/pacer leads from the left in stable position. Generous heart size and vascular pedicle width accentuated by low volume portable technique. There is no edema, consolidation, effusion, or pneumothorax. IMPRESSION: No evidence of acute disease. Electronically Signed   By: Jorje Guild M.D.   On: 12/04/2021 11:13  ? ?ECHOCARDIOGRAM LIMITED ? ?Result Date: 12/04/2021 ?   ECHOCARDIOGRAM LIMITED REPORT   Patient Name:   Glenn [name] Date of Exam: 12/04/2021 Medical Rec #:  [id number]     Height:       72.0 in Accession #:    [id number]    Weight:       230.0 lb Date of Birth:  1966/07/11     BSA:  2.261 m? Patient Age:    56 years      BP:           138/57 mmHg Patient Gender: M             HR:           70 bpm. Exam Location:  Inpatient Procedure: Limited Echo, Cardiac Doppler and Color Doppler Indications:     R06.02 SOB  History:         Patient has prior history of Echocardiogram examinations, most                  recent 09/30/2015. Abnormal ECG and Defibrillator,                  Arrythmias:Atrial Fibrillation, Signs/Symptoms:Dyspnea; Risk                  Factors:Hypertension.  Sonographer:     Roseanna Rainbow RDCS Referring Phys:  [place] Diagnosing Phys: Franki Monte  Sonographer Comments: Echo performed with patient supine and on artificial respirator and Technically difficult study due to poor echo windows. Image acquisition challenging due to respiratory motion. Stat echo in ED, Dr. Aundra Dubin present. IMPRESSIONS  1. Left ventricular ejection fraction, by estimation, is 45 to 50%. The left ventricle has mildly decreased function. The left ventricle demonstrates regional wall motion  abnormalities with basal to mid anterolateral hypokinesis. moderate asymmetric septal left ventricular hypertrophy. No LV outflow tract gradient or mitral valve systolic anterior motion. Left ventricular diastoli

## 2021-12-05 NOTE — Progress Notes (Signed)
Pt in A. Fib RVR 120s-170s. RN called E-Link at 2329 regarding HR. E-Link Physician ordered Digoxin 0.25 mg IV and requested input from cardiology. Digoxin did not affect HR. RN called E-Link at 0133 regarding previous Magnesium lab resulting at 1.6, discussed drawing labs early and reevaluating replacement options afterwards. At 0246, RN called Cards Fellow, Dr. Daphine Deutscher, regarding Magnesium level at 1.2 and patient's HR continuously ranging from 130-170s. Verbal order for Amiodarone bolus and 2g Magnesium replacement. E-Link Physician ordered repeat dose of Digoxin 0.25mg  at 0430 for A. Fib RVR.  ?

## 2021-12-05 NOTE — Progress Notes (Addendum)
? ? Advanced Heart Failure Rounding Note ? ?PCP-Cardiologist: None  ? ?Subjective:   ?4/20 on multiple pressors. Shock unclear etiology. Given 120 mg IV lasix x1.  ? ?Last night developed A fib RVR with rate 140-160s. Given digoxin and 150 mg amio x1. Continued with rates 150-160s.  ? ?Off Norepi, Epi, and Vaso.  Remains on Dobutamine 2.5 mcg. Hypertensive.  ? ?CO-OX 59% >>pending.  ? ?Awake and intubated.  ? ? ?Objective:   ?Weight Range: ?107.1 kg ?Body mass index is 32.02 kg/m?.  ? ?Vital Signs:   ?Temp:  [96.6 ?F (35.9 ?C)-99.1 ?F (37.3 ?C)] 97.9 ?F (36.6 ?C) (04/21 0732) ?Pulse Rate:  [28-152] 107 (04/21 0732) ?Resp:  [19-50] 35 (04/21 0732) ?BP: (35-139)/(20-95) 139/95 (04/20 1730) ?SpO2:  [95 %-100 %] 100 % (04/21 0732) ?Arterial Line BP: (100-164)/(54-99) 152/94 (04/21 0732) ?FiO2 (%):  [40 %-100 %] 50 % (04/21 0304) ?Weight:  [104.3 kg-107.1 kg] 107.1 kg (04/21 0500) ?Last BM Date :  (PTA) ? ?Weight change: ?Filed Weights  ? 12/04/21 1106 12/05/21 0500  ?Weight: 104.3 kg 107.1 kg  ? ? ?Intake/Output:  ? ?Intake/Output Summary (Last 24 hours) at 12/05/2021 0743 ?Last data filed at 12/05/2021 0600 ?Gross per 24 hour  ?Intake 1656.99 ml  ?Output 2180 ml  ?Net -523.01 ml  ?  ? ? ?Physical Exam  ? CVP 4 ?General: Intubated.  ?HEENT: ETT ?Neck: Supple. JVP difficult to assess . Carotids 2+ bilat; no bruits. No lymphadenopathy or thyromegaly appreciated. ?Cor: PMI nondisplaced. Tachy Irregular rate & rhythm. No rubs, gallops or murmurs. ?Lungs: Clear ?Abdomen: Soft, nontender, nondistended. No hepatosplenomegaly. No bruits or masses. Good bowel sounds. ?Extremities: No cyanosis, clubbing, rash, edema ?Neuro:Intubated. MAE follows commands.  ?GU: Foley  ?Telemetry  ? ?A fib RVR 140-160s with NSVT ? ?EKG  ?  ?N/A  ? ?Labs  ?  ?CBC ?Recent Labs  ?  12/04/21 ?1054 12/04/21 ?1129 12/04/21 ?1340 12/05/21 ?0147  ?WBC 7.5  --  12.0* 12.5*  ?NEUTROABS 4.4  --   --   --   ?HGB 13.9   < > 14.4 15.1  ?HCT 42.2   < > 46.0 43.9   ?MCV 95.7  --  96.0 90.7  ?PLT 209  --  205 207  ? < > = values in this interval not displayed.  ? ?Basic Metabolic Panel ?Recent Labs  ?  12/04/21 ?1340 12/04/21 ?1605 12/05/21 ?0147  ?NA  --  141 143  ?K  --  4.9 3.7  ?CL  --  113* 113*  ?CO2  --  21* 18*  ?GLUCOSE  --  290* 97  ?BUN  --  18 20  ?CREATININE  --  2.48* 2.45*  ?CALCIUM  --  7.6* 8.7*  ?MG 1.6*  --  1.2*  ? ?Liver Function Tests ?No results for input(s): AST, ALT, ALKPHOS, BILITOT, PROT, ALBUMIN in the last 72 hours. ?No results for input(s): LIPASE, AMYLASE in the last 72 hours. ?Cardiac Enzymes ?No results for input(s): CKTOTAL, CKMB, CKMBINDEX, TROPONINI in the last 72 hours. ? ?BNP: ?BNP (last 3 results) ?Recent Labs  ?  12/04/21 ?1054  ?BNP 142.3*  ? ? ?ProBNP (last 3 results) ?No results for input(s): PROBNP in the last 8760 hours. ? ? ?D-Dimer ?No results for input(s): DDIMER in the last 72 hours. ?Hemoglobin A1C ?Recent Labs  ?  12/04/21 ?1340  ?HGBA1C 5.4  ? ?Fasting Lipid Panel ?Recent Labs  ?  12/05/21 ?0147  ?TRIG 41  ? ?Thyroid  Function Tests ?No results for input(s): TSH, T4TOTAL, T3FREE, THYROIDAB in the last 72 hours. ? ?Invalid input(s): FREET3 ? ?Other results: ? ? ?Imaging  ? ? ?CT HEAD WO CONTRAST (5MM) ? ?Result Date: 12/04/2021 ?CLINICAL DATA:  Altered mental status.  Cardiogenic shock. EXAM: CT HEAD WITHOUT CONTRAST TECHNIQUE: Contiguous axial images were obtained from the base of the skull through the vertex without intravenous contrast. RADIATION DOSE REDUCTION: This exam was performed according to the departmental dose-optimization program which includes automated exposure control, adjustment of the mA and/or kV according to patient size and/or use of iterative reconstruction technique. COMPARISON:  None. FINDINGS: Brain: No evidence of acute infarction, hemorrhage, hydrocephalus, extra-axial collection or mass lesion/mass effect. Scattered mild periventricular and subcortical white matter hypodensities are nonspecific, but  favored to reflect chronic microvascular ischemic changes. Vascular: No hyperdense vessel or unexpected calcification. Skull: Normal. Negative for fracture or focal lesion. Sinuses/Orbits: No acute finding. Old right lamina papyracea fracture. Other: None. IMPRESSION: 1. No acute intracranial abnormality. 2. Mild chronic microvascular ischemic changes. Electronically Signed   By: Titus Dubin M.D.   On: 12/04/2021 17:30  ? ?DG CHEST PORT 1 VIEW ? ?Result Date: 12/04/2021 ?CLINICAL DATA:  Central line placement.  Endotracheal tube present. EXAM: PORTABLE CHEST 1 VIEW COMPARISON:  Prior today FINDINGS: A new right jugular central venous catheter is seen with tip overlying the mid portion of the SVC. Endotracheal tube and nasogastric tube remain in appropriate position. Heart size is within normal limits. Pacemaker leads remain in expected position. Both lungs are clear. No evidence of pneumothorax or pleural effusion. IMPRESSION: New right jugular central venous catheter in appropriate position. No evidence of pneumothorax or other acute findings. Electronically Signed   By: Marlaine Hind M.D.   On: 12/04/2021 12:49  ? ?DG Chest Portable 1 View ? ?Result Date: 12/04/2021 ?CLINICAL DATA:  Shortness of breath, hypotension EXAM: PORTABLE CHEST 1 VIEW COMPARISON:  Previous studies including the examination done earlier today FINDINGS: Transverse diameter of heart is increased. Tip of endotracheal tube is 6 cm above the carina. Enteric tube is noted traversing the esophagus. Pacemaker/defibrillator battery is seen in the left infraclavicular region. Central pulmonary vessels are prominent. Part of this may be due to supine position. There are no signs of alveolar pulmonary edema or focal pulmonary consolidation. Left lateral CP angle is not included in the radiograph. There is no evidence of significant pleural effusion or pneumothorax. IMPRESSION: Cardiomegaly. Central pulmonary vessels are prominent without signs of  alveolar pulmonary edema. There is no focal pulmonary consolidation. Electronically Signed   By: Elmer Picker M.D.   On: 12/04/2021 11:58  ? ?DG Chest Port 1 View ? ?Result Date: 12/04/2021 ?CLINICAL DATA:  Shortness of breath EXAM: PORTABLE CHEST 1 VIEW COMPARISON:  08/24/2016 FINDINGS: Dual-chamber ICD/pacer leads from the left in stable position. Generous heart size and vascular pedicle width accentuated by low volume portable technique. There is no edema, consolidation, effusion, or pneumothorax. IMPRESSION: No evidence of acute disease. Electronically Signed   By: Jorje Guild M.D.   On: 12/04/2021 11:13  ? ?ECHOCARDIOGRAM LIMITED ? ?Result Date: 12/04/2021 ?   ECHOCARDIOGRAM LIMITED REPORT   Patient Name:   Glenn [name] Date of Exam: 12/04/2021 Medical Rec #:  [id number]     Height:       72.0 in Accession #:    [id number]    Weight:       230.0 lb Date of Birth:  06-29-1966     BSA:  2.261 m? Patient Age:    29 years      BP:           138/57 mmHg Patient Gender: M             HR:           70 bpm. Exam Location:  Inpatient Procedure: Limited Echo, Cardiac Doppler and Color Doppler Indications:     R06.02 SOB  History:         Patient has prior history of Echocardiogram examinations, most                  recent 09/30/2015. Abnormal ECG and Defibrillator,                  Arrythmias:Atrial Fibrillation, Signs/Symptoms:Dyspnea; Risk                  Factors:Hypertension.  Sonographer:     Roseanna Rainbow RDCS Referring Phys:  [place] Diagnosing Phys: Franki Monte  Sonographer Comments: Echo performed with patient supine and on artificial respirator and Technically difficult study due to poor echo windows. Image acquisition challenging due to respiratory motion. Stat echo in ED, Dr. Aundra Dubin present. IMPRESSIONS  1. Left ventricular ejection fraction, by estimation, is 45 to 50%. The left ventricle has mildly decreased function. The left ventricle demonstrates regional wall motion  abnormalities with basal to mid anterolateral hypokinesis. moderate asymmetric septal left ventricular hypertrophy. No LV outflow tract gradient or mitral valve systolic anterior motion. Left ventricular diastoli

## 2021-12-05 NOTE — Progress Notes (Signed)
Set up for TEE DC-CV today at 1300.  ? ? ?Raylan Troiani NP-C  ?9:06 AM ? ?

## 2021-12-05 NOTE — Procedures (Signed)
Electrical Cardioversion Procedure Note ?Glenn [name] ?[id number] ?07-Oct-1965 ? ?Procedure: Electrical Cardioversion ?Indications:  Atrial Fibrillation ? ?Procedure Details ?Consent: Risks of procedure as well as the alternatives and risks of each were explained to the (patient/caregiver).  Consent for procedure obtained. ?Time Out: Verified patient identification, verified procedure, site/side was marked, verified correct patient position, special equipment/implants available, medications/allergies/relevent history reviewed, required imaging and test results available.  Performed ? ?Patient placed on cardiac monitor, pulse oximetry, supplemental oxygen as necessary.  ?Sedation given:  Propofol, Fentanyl ?Pacer pads placed anterior and posterior chest. ? ?Cardioverted 1 time(s).  ?Cardioverted at 200J. ? ?Evaluation ?Findings: Post procedure EKG shows: NSR ?Complications: None ?Patient did tolerate procedure well. ? ? ?Glenn [name] ?12/05/2021, 1:49 PM ? ? ? ?

## 2021-12-05 NOTE — Procedures (Signed)
Extubation Procedure Note ? ?Patient Details:   ?Name: Glenn [name] ?DOB: November 06, 1965 ?MRN: [id number] ?  ?Airway Documentation:  ?  ?Vent end date: 12/05/21 Vent end time: 1701  ? ?Evaluation ? O2 sats: stable throughout ?Complications: No apparent complications ?Patient did tolerate procedure well. ?Bilateral Breath Sounds: Clear, Diminished ?  ?Yes ?Pt is able to speak and state name.Audible cuff leak and no signs of stridor at this time. Pt is currently on 5L [place] sats @ 96%. RT will continue to monitor for any changes. ? ?Felecia Jan ?12/05/2021, 5:02 PM ? ?

## 2021-12-05 NOTE — Progress Notes (Signed)
eLink Physician-Brief Progress Note ?Patient Name: Glenn [name] ?DOB: [birth year] ?MRN: [id number] ? ? ?Date of Service ? 12/05/2021  ?HPI/Events of Note ? Patient remains in atrial fibrillation with RVR despite 0.25 mg of Digoxin iv x 1.  ?eICU Interventions ? Digoxin 0.25 mg iv repeated x 1.  ? ? ? ?  ? ?[name] ?12/05/2021, 3:32 AM ?

## 2021-12-05 NOTE — Progress Notes (Signed)
ANTICOAGULATION CONSULT NOTE - Follow Up Consult ? ?Pharmacy Consult for Heparin ?Indication: Afib ? ?Allergies  ?Allergen Reactions  ? Ace Inhibitors Swelling  ?  Swelling lips  ? Penicillins Other (See Comments)  ?  Childhood allergy  ? Thiazide-Type Diuretics   ? ? ?Patient Measurements: ?Height: 6' (182.9 cm) ?Weight: 107.1 kg (236 lb 1.8 oz) ?IBW/kg (Calculated) : 77.6 ?Heparin Dosing Weight: 99.2 kg ? ?Vital Signs: ?Temp: 98 ?F (36.7 ?C) (04/21 1600) ?Temp Source: Oral (04/21 1600) ?BP: 134/98 (04/21 1700) ?Pulse Rate: 85 (04/21 1700) ? ?Labs: ?Recent Labs  ?  12/04/21 ?1054 12/04/21 ?1129 12/04/21 ?1253 12/04/21 ?1340 12/04/21 ?1605 12/05/21 ?0147 12/05/21 ?1610 12/05/21 ?9604 12/05/21 ?1620  ?HGB 13.9   < > 14.6 14.4  --  15.1  --   --   --   ?HCT 42.2   < > 43.0 46.0  --  43.9  --   --   --   ?PLT 209  --   --  205  --  207  --   --   --   ?APTT  --   --   --  26  --   --  164*  --  156*  ?LABPROT  --   --   --  18.7*  --   --   --   --   --   ?INR  --   --   --  1.6*  --   --   --   --   --   ?HEPARINUNFRC  --   --   --  >1.10*  --   --  >1.10*  --   --   ?CREATININE 1.92*   < >  --   --  2.48* 2.45*  --  2.32*  --   ?TROPONINIHS 12  --   --  11  --   --   --   --   --   ? < > = values in this interval not displayed.  ? ? ? ?Estimated Creatinine Clearance: 44.4 mL/min (A) (by C-G formula based on SCr of 2.32 mg/dL (H)). ? ? ?Medical History: ?Past Medical History:  ?Diagnosis Date  ? A-fib ([place]) 09/29/15  ? Cardiomyopathy ([place])   ? Dysrhythmia   ? Enlarged prostate   ? Gout   ? Hypertension   ? ? ?Medications:  ?Scheduled:  ? atorvastatin  80 mg Per Tube Daily  ? chlorhexidine gluconate (MEDLINE KIT)  15 mL Mouth Rinse BID  ? Chlorhexidine Gluconate Cloth  6 each Topical Daily  ? docusate  100 mg Per Tube BID  ? folic acid  1 mg Per Tube Daily  ? hydrocortisone sodium succinate  100 mg Intravenous Q8H  ? insulin aspart  0-9 Units Subcutaneous Q4H  ? mouth rinse  15 mL Mouth Rinse 10 times per day  ?  multivitamin with minerals  1 tablet Per Tube Daily  ? [START ON 12/06/2021] pantoprazole sodium  40 mg Per Tube Daily  ? polyethylene glycol  17 g Per Tube Daily  ? sodium chloride flush  10-40 mL Intracatheter Q12H  ? thiamine injection  100 mg Intravenous Daily  ? ? ?Assessment: ?25 YOM presented with acute onset, severe SOB and hypotensive. History of chronic cardiomyopathy and Afib on Eliquis PTA with a pacemaker defibrillator placed, history of hypercholesterolemia, history of hypertension and a heavy daily alcohol drinker. CBC stable, no signs of bleeding. ? ?Will dose heparin with aptt for now as  heparin level remains affected from recent apixaban  ? ?Heparin drip rate 1200 uts /hr with aptt elevated 156sec > goal.  Heparin drip running throught PIV and labs drawn from femoral A-Line.  CBC stable and no bleeding noted   ?S/p TEE and DCCV today  - will want to maintain therapeutic anticoagulation ? ?Goal of Therapy:  ?Heparin level 0.3-0.7 units/ml ?aPTT 66-102 seconds ?Monitor platelets by anticoagulation protocol: Yes ?  ?Plan:  ?Decrease Heparin drip at 1000 uts/hr  ?Daily heparin level, aptt and CBC  ?Monitor for signs/symptoms of bleed ? ? ?Bonnita Nasuti Pharm.D. CPP, BCPS ?Clinical Pharmacist ?[phone] ?12/05/2021 5:59 PM  ? ?Please check AMION for all [place] numbers ?After 10:00 PM, call [place] [phone] ? ? ? ? ?

## 2021-12-05 NOTE — Progress Notes (Signed)
?  Echocardiogram ?Echocardiogram Transesophageal has been performed. ? ?Glenn [name] ?12/05/2021, 2:09 PM ?

## 2021-12-05 NOTE — Progress Notes (Signed)
ANTICOAGULATION CONSULT NOTE  ?Pharmacy Consult for Heparin ?Indication: atrial fibrillation ?Brief A/P: aPTT supratherapeutic Decrease Heparin rate ? ?Allergies  ?Allergen Reactions  ? Ace Inhibitors Swelling  ?  Swelling lips  ? Penicillins Other (See Comments)  ?  Childhood allergy  ? Thiazide-Type Diuretics   ? ? ?Patient Measurements: ?Height: 6' (182.9 cm) ?Weight: 107.1 kg (236 lb 1.8 oz) ?IBW/kg (Calculated) : 77.6 ?Heparin Dosing Weight: 99.2 kg ? ?Vital Signs: ?Temp: 98.6 ?F (37 ?C) (04/21 0600) ?Temp Source: Esophageal (04/21 0400) ?Pulse Rate: 118 (04/21 0600) ? ?Labs: ?Recent Labs  ?  12/04/21 ?1054 12/04/21 ?1129 12/04/21 ?1253 12/04/21 ?1340 12/04/21 ?1605 12/05/21 ?0147 12/05/21 ?[id number]  ?HGB 13.9 13.9 14.6 14.4  --  15.1  --   ?HCT 42.2 41.0 43.0 46.0  --  43.9  --   ?PLT 209  --   --  205  --  207  --   ?APTT  --   --   --  26  --   --  164*  ?LABPROT  --   --   --  18.7*  --   --   --   ?INR  --   --   --  1.6*  --   --   --   ?HEPARINUNFRC  --   --   --  >1.10*  --   --   --   ?CREATININE 1.92* 2.00*  --   --  2.48* 2.45*  --   ?TROPONINIHS 12  --   --  11  --   --   --   ? ? ? ?Estimated Creatinine Clearance: 42.1 mL/min (A) (by C-G formula based on SCr of 2.45 mg/dL (H)). ? ?Assessment: ?56 y.o. male with h/o Afib, Eliquis on hold, for hepairn ? ?Goal of Therapy:  ?Heparin level 0.3-0.7 units/ml ?aPTT 66-102 seconds ?Monitor platelets by anticoagulation protocol: Yes ?  ?Plan:  ?Decrease Heparin 1200 units/hr ?Check aPTT in 8 hours  ? ?Phillis Knack, PharmD, BCPS ? ? ? ? ? ?

## 2021-12-05 NOTE — Interval H&P Note (Signed)
History and Physical Interval Note: ? ?12/05/2021 ?1:51 PM ? ?Glenn [name]  has presented today for surgery, with the diagnosis of * No surgery found *.  The various methods of treatment have been discussed with the patient and family. After consideration of risks, benefits and other options for treatment, the patient has consented to  * No surgery found * as a surgical intervention.  The patient's history has been reviewed, patient examined, no change in status, stable for surgery.  I have reviewed the patient's chart and labs.  Questions were answered to the patient's satisfaction.   ? ? ?Oanh Devivo Aundra Dubin ? ? ?

## 2021-12-05 NOTE — Progress Notes (Addendum)
? ?NAME:  Glenn [name], MRN:  [id number], DOB:  [birth year], LOS: 1 ?ADMISSION DATE:  12/04/2021, CONSULTATION DATE:  4/20 ?REFERRING MD:  Dr. Sabra Heck, CHIEF COMPLAINT:  Shock; Acute respiratory failure requiring intubation ? ?History of Present Illness:  ?Patient is a 56 year old male with pertinent PMH of A-fib on eliquis, cardiomyopathy with ICD placement 2013, HTN, HLD, heavy alcohol use according to family presents to [facility] ED on 4/20 with respiratory distress and hypotension. ? ?On 4/20 patient developed acute onset severe SOB.  Having generalized fatigue/weakness.  Became diaphoretic.  Patient presents to [facility] ED on 4/20 with increased SOB.  Patient noted to be hypotensive with systolic BP [id number].  Patient arrived on NRB.  Patient noted to be in A-fib with rates 120s and ectopic PVCs.  Patient given IV fluids and started on norepinephrine.  Initially started on BiPAP but patient vomited.  Patient was then intubated and placed on Mech vent.  CXR showing ETT 6 cm above carina, cardiomegaly, no significant signs of edema/consolidation.  K4.9, CO2 19, glucose 218, creatinine 1.92, WBC 7.5, Hgb 13.9, troponin 12.  BNP, ABG, UA, BC x2, COVID/flu pending cardiology consulted.  PCCM consulted for ICU admission. ? ?Pertinent  Medical History  ? ?Past Medical History:  ?Diagnosis Date  ? A-fib ([place]) 09/29/15  ? Cardiomyopathy ([place])   ? Dysrhythmia   ? Enlarged prostate   ? Gout   ? Hypertension   ? ? ? ?Significant Hospital Events: ?Including procedures, antibiotic start and stop dates in addition to other pertinent events   ?4/20: Patient admitted to [facility] in shock; on levo; intubated ? ?Interim History / Subjective:  ?Off pressors. ?Cards planning for DCCV today. ?Mental status good, awake on vent.  Had SBT earlier but HR increased; therefore, aborted and back to full support for now.  Plan is for hopeful extubation after DCCV. ? ?Objective   ?Blood pressure (!) 142/106, pulse (!) 105, temperature 97.7 ?F (36.5 ?C), resp. rate  (!) 35, height 6' (1.829 m), weight 107.1 kg, SpO2 99 %. ?CVP:  [3 mmHg-44 mmHg] 11 mmHg  ?Vent Mode: PRVC ?FiO2 (%):  [40 %-100 %] 50 % ?Set Rate:  [14 bmp-35 bmp] 35 bmp ?Vt Set:  [620 mL] 620 mL ?PEEP:  [5 cmH20-6 cmH20] 5 cmH20 ?Pressure Support:  [10 cmH20] 10 cmH20 ?Plateau Pressure:  [17 [id number] cmH20] 17 cmH20  ? ?Intake/Output Summary (Last 24 hours) at 12/05/2021 0955 ?Last data filed at 12/05/2021 0900 ?Gross per 24 hour  ?Intake 1951.78 ml  ?Output 2280 ml  ?Net -328.22 ml  ? ? ?Filed Weights  ? 12/04/21 1106 12/05/21 0500  ?Weight: 104.3 kg 107.1 kg  ? ? ?Examination: ?General: Adult male, resting in bed, in NAD. ?Neuro: Awake on vent, follows basic commands. ?HEENT: [place]/AT. Sclerae anicteric. ETT in place. ?Cardiovascular: Tachy, irregular, no M/R/G.  ?Lungs: Respirations even and unlabored.  CTA bilaterally, No W/R/R. ?Abdomen: BS x 4, soft, NT/ND.  ?Musculoskeletal: No gross deformities, no edema.  ?Skin: Intact, warm, no rashes. ? ?Assessment & Plan:  ? ?Shock: Cardiogenic shock, complicated by severe MR; ddx includes sepsis vs PE though objective data at this point doesn't support either of those diagnoses and neither does hx.  Shock now improved and currently off pressors.  Remains tachy in [place] ?Hx A.fib on Eliquis, HCM, HTN, HLD. ?- Cards following, appreciate the assistance. ?- Continue Amiodarone, Heparin. ?- DCCV planned for today per cards ?- Diuresis per cards, currently on hold given CVP 4. ?- Stop abx  as really doubt infection here. PCT reassuring. ? ?Acute respiratory failure with hypoxia ?- SBT earlier but stopped given tachycardia, plan to repeat after DCCV this PM and plan for extubation. ?- Routine bronchial hygiene. ?- Follow CXR. ? ?AKI ?Hypophosphatemia - being repleted. ?- Trend BMP / urinary output. ?- Replace electrolytes as indicated. ? ?Hyperglycemia - A1c 5.4. ?- SSI and cbg monitoring. ? ?Hx of BPH? ?P: ?-hold home terazosin and tamsulosin ? ?Hx EtOH abuse, ? Depression. ?-  Thiamine, Folate. ?- Monitor for withdrawal. ?- Hold home Wellbutrin. ? ? ?Best Practice (right click and "Reselect all SmartList Selections" daily)  ? ?Diet/type: NPO w/ meds via tube ?DVT prophylaxis: systemic heparin ?GI prophylaxis: PPI ?Lines: Central line and Arterial Line ?Foley:  N/A ?Code Status:  full code ?Last date of multidisciplinary goals of care discussion [pending] ? ?Critical care time: 30 minutes  ? ?Montey Hora, PA - C ?[facility] ?For pager details, please see AMION or use Epic chat  ?After 1900, please call [facility] for cross coverage needs ?12/05/2021, 10:01 AM ? ? ? ?Pulmonary critical care attending: ? ?This is a 56 year old gentleman with past medical history of atrial fibrillation on Eliquis, cardiomyopathy with an ICD placement 2013, hypertension, hyperlipidemia, alcohol use.  Patient presented in atrial fibrillation with RVR, ectopic PVCs was hypotensive significant lower extremity edema concerning for cardiogenic shock. ? ?BP (!) 118/91   Pulse (!) 109   Temp (!) 97.5 ?F (36.4 ?C)   Resp (!) 35   Ht 6' (1.829 m)   Wt 107.1 kg   SpO2 98%   BMI 32.02 kg/m?   ?General: Male, intubated on mechanical life support critically ill ?Neuro: Awake alert does follow commands while on vent. ?HEENT: NCAT, tracking appropriately ?Heart: Tachycardic irregular ?Lungs: Bilateral mechanically ventilated breath sounds ?Skin: Dependent edema ? ?Labs: Reviewed ?[place] 59 now 72 ? ?SCR 2.4 >> 2.4>>> 2.32 ? ?Assessment: ?Cardiogenic shock ?Severe MR ?Atrial fibrillation with RVR ?Acute hypoxic respiratory failure requiring intubation mechanical ventilation ?AKI ?History of alcohol abuse ?Depression ? ?Plan: ?Plan for TEE cardioversion today at bedside with cardiology. ?Holding diuresis, cvp 4 ?Continue amiodarone, heparin ?SBT SAT following cardioversion. ? ?This patient is critically ill with multiple organ system failure; which, requires frequent high complexity decision  making, assessment, support, evaluation, and titration of therapies. This was completed through the application of advanced monitoring technologies and extensive interpretation of multiple databases. During this encounter critical care time was devoted to patient care services described in this note for 35 minutes.  ? ?Garner Nash, DO ?[facility] ?12/05/2021 11:27 AM   ? ? ? ? ?

## 2021-12-05 NOTE — Progress Notes (Addendum)
eLink Physician-Brief Progress Note ?Patient Name: Glenn [name] ?DOB: 30-Dec-1965 ?MRN: [id number] ? ? ?Date of Service ? 12/05/2021  ?HPI/Events of Note ? BP 142/111  ?eICU Interventions ? Hydralazine 5 mg iv Q 4 hour PRN SBP > 150, DBP > 100 ordered. ?ADDENDUM ?BP still elevated after 5 mg Hydralazine dose so dose increased to 10 mg and a dose given stat.  ? ? ? ?  ? ?Glenn [name] ?12/05/2021, 8:18 PM ?

## 2021-12-05 NOTE — CV Procedure (Signed)
Procedure: TEE ? ?Indication: Atrial fibrillation ? ?Sedation: Propofol and Fentanyl  ? ?Findings: Please see echo section for full report.  No LA appendage thrombus, MR only mild to moderate.  ? ?Glenn [name] ?12/05/2021 ?1:48 PM ? ?

## 2021-12-05 NOTE — Progress Notes (Signed)
eLink Physician-Brief Progress Note ?Patient Name: Glenn [name] ?DOB: 15-Jan-1966 ?MRN: [id number] ? ? ?Date of Service ? 12/05/2021  ?HPI/Events of Note ? Patient with atrial fibrillation with RVR, heart rate 120 - 160, BP 120's/80 but he is on Dobutamine gtt at 2.5 mcg.  ?eICU Interventions ? Will trial Digoxin for rate control, hopefully heart failure cardiology will add input in AM.  ? ? ? ?  ? ?Thomasene Lot Anatasia Tino ?12/05/2021, 12:24 AM ?

## 2021-12-05 NOTE — Progress Notes (Signed)
PCCM Interval Progress Note ? ?DCCV successful earlier by cards. ? ?Sedation turned off around 1600. Pt placed on SBT and tolerated well. ?Mental status great, following all commands, vitals stable. ? ?Proceed with extubation. ? ? ?Rutherford Guys, PA - C ?[facility] ?For pager details, please see AMION or use Epic chat  ?After 1900, please call [facility] for cross coverage needs ?12/05/2021, 4:44 PM ? ?

## 2021-12-05 NOTE — Progress Notes (Signed)
Initial Nutrition Assessment ? ?DOCUMENTATION CODES:  ? ?Not applicable ? ?INTERVENTION:  ? ?If unable to extubate, recommend initiation of enteral nutrition: ?- Vital 1.5 @ 60 ml/hr (1440 ml/day) ?- ProSource TF 45 ml TID ? ?Tube feeding regimen provides 2280 kcal, 130 grams of protein, and 1100 ml of H2O. ? ?NUTRITION DIAGNOSIS:  ? ?Inadequate oral intake related to inability to eat as evidenced by NPO status. ? ?GOAL:  ? ?Patient will meet greater than or equal to 90% of their needs ? ?MONITOR:  ? ?Vent status, Labs, Weight trends, I & O's ? ?REASON FOR ASSESSMENT:  ? ?Ventilator ?  ? ?ASSESSMENT:  ? ?56 year old male who presented to the ED on 4/20 with SOB and hypotension. Pt intubated in the ED. PMH of atrial fibrillation, cardiomyopathy s/p ICD placement in 2013, HTN, HLD, EtOH abuse. Pt admitted with cardiogenic shock, AKI. ? ?04/21 - s/p TEE and DCCV ? ?Discussed pt with RN. Possibility of extubation either later today or tomorrow morning. Pt with OG tube in place. Discussed pt with CCM. No plans to start TF at this time. RD will leave TF recommendations. ? ?Spoke with family at bedside. They report pt had a good appetite and ate well. They report no nutrition-related issues PTA. Family reports pt was trying to lose weight and has lost some weight but also gained some of that back. ? ?Patient is currently intubated on ventilator support ?MV: 20.1 L/min ?Temp (24hrs), Avg:97.6 ?F (36.4 ?C), Min:96.6 ?F (35.9 ?C), Max:99 ?F (37.2 ?C) ? ?Drips: ?Propofol: 45 ml/hr (provides 1188 kcal daily from lipid) ?Fentanyl ?Amiodarone ?Heparin ?NS: 20 ml/hr ? ?Medications reviewed and include: colace, folic acid, IV solu-cortef, SSI q 4 hours, MVI with minerals daily, protonix, miralax, IV thiamine, IV potassium phosphate 30 mmol once, IV magnesium sulfate 4 grams once ? ?Labs reviewed: BUN 22, creatinine 2.32, phosphorus 1.6, magnesium 1.2, elevated LFTs, WBC 12.5 ?CBG's: 90-258 x 24 hours ? ?UOP: 2180 ml x 24  hours ?I/O's: +323 ml since admit ? ?NUTRITION - FOCUSED PHYSICAL EXAM: ? ?Flowsheet Row Most Recent Value  ?Orbital Region No depletion  ?Upper Arm Region No depletion  ?Thoracic and Lumbar Region No depletion  ?Buccal Region Unable to assess  ?Temple Region No depletion  ?Clavicle Bone Region No depletion  ?Clavicle and Acromion Bone Region No depletion  ?Scapular Bone Region No depletion  ?Dorsal Hand No depletion  ?Patellar Region No depletion  ?Anterior Thigh Region No depletion  ?Posterior Calf Region No depletion  ?Edema (RD Assessment) Mild  ?Hair Reviewed  ?Eyes Reviewed  ?Mouth Reviewed  ?Skin Reviewed  ?Nails Reviewed  ? ?  ? ? ?Diet Order:   ?Diet Order   ? ?       ?  Diet NPO time specified  Diet effective midnight       ?  ?  Diet NPO time specified  Diet effective now       ?  ? ?  ?  ? ?  ? ? ?EDUCATION NEEDS:  ? ?No education needs have been identified at this time ? ?Skin:  Skin Assessment: Reviewed RN Assessment ? ?Last BM:  no documented BM ? ?Height:  ? ?Ht Readings from Last 1 Encounters:  ?12/04/21 6' (1.829 m)  ? ? ?Weight:  ? ?Wt Readings from Last 1 Encounters:  ?12/05/21 107.1 kg  ? ? ?BMI:  Body mass index is 32.02 kg/m?. ? ?Estimated Nutritional Needs:  ? ?Kcal:  2200-2400 ? ?Protein:  120-140 grams ? ?  Fluid:  >2.0 L/day ? ? ? ?Gustavus Bryant, MS, RD, LDN ?Inpatient Clinical Dietitian ?Please see AMiON for contact information. ? ?

## 2021-12-06 ENCOUNTER — Inpatient Hospital Stay (HOSPITAL_COMMUNITY): Payer: Medicare Other

## 2021-12-06 DIAGNOSIS — I5033 Acute on chronic diastolic (congestive) heart failure: Secondary | ICD-10-CM

## 2021-12-06 LAB — LIPID PANEL
Cholesterol: 100 mg/dL (ref 0–200)
HDL: 49 mg/dL (ref >40)
LDL Cholesterol: 36 mg/dL (ref 0–99)
Total CHOL/HDL Ratio: 2 RATIO
Triglycerides: 75 mg/dL (ref <150)
VLDL: 15 mg/dL (ref 0–40)

## 2021-12-06 LAB — BASIC METABOLIC PANEL
Anion gap: 8 (ref 5–15)
BUN: 21 mg/dL — ABNORMAL HIGH (ref 6–20)
CO2: 22 mmol/L (ref 22–32)
Calcium: 8.3 mg/dL — ABNORMAL LOW (ref 8.9–10.3)
Chloride: 109 mmol/L (ref 98–111)
Creatinine, Ser: 1.64 mg/dL — ABNORMAL HIGH (ref 0.61–1.24)
GFR, Estimated: 48 mL/min — ABNORMAL LOW (ref >60)
Glucose, Bld: 119 mg/dL — ABNORMAL HIGH (ref 70–99)
Potassium: 4.4 mmol/L (ref 3.5–5.1)
Sodium: 139 mmol/L (ref 135–145)

## 2021-12-06 LAB — GLUCOSE, CAPILLARY
Glucose-Capillary: 121 mg/dL — ABNORMAL HIGH (ref 70–99)
Glucose-Capillary: 111 mg/dL — ABNORMAL HIGH (ref 70–99)
Glucose-Capillary: 105 mg/dL — ABNORMAL HIGH (ref 70–99)
Glucose-Capillary: 116 mg/dL — ABNORMAL HIGH (ref 70–99)
Glucose-Capillary: 179 mg/dL — ABNORMAL HIGH (ref 70–99)
Glucose-Capillary: 109 mg/dL — ABNORMAL HIGH (ref 70–99)

## 2021-12-06 LAB — CBC
HCT: 43.6 % (ref 39.0–52.0)
HCT: 41.7 % (ref 39.0–52.0)
Hemoglobin: 14.7 g/dL (ref 13.0–17.0)
Hemoglobin: 14.1 g/dL (ref 13.0–17.0)
MCH: 31.4 pg (ref 26.0–34.0)
MCH: 31.3 pg (ref 26.0–34.0)
MCHC: 33.7 g/dL (ref 30.0–36.0)
MCHC: 33.8 g/dL (ref 30.0–36.0)
MCV: 93.2 fL (ref 80.0–100.0)
MCV: 92.7 fL (ref 80.0–100.0)
Platelets: 188 10*3/uL (ref 150–400)
Platelets: 175 10*3/uL (ref 150–400)
RBC: 4.68 MIL/uL (ref 4.22–5.81)
RBC: 4.5 MIL/uL (ref 4.22–5.81)
RDW: 14.8 % (ref 11.5–15.5)
RDW: 14.7 % (ref 11.5–15.5)
WBC: 17.5 10*3/uL — ABNORMAL HIGH (ref 4.0–10.5)
WBC: 14.5 10*3/uL — ABNORMAL HIGH (ref 4.0–10.5)
nRBC: 0 % (ref 0.0–0.2)
nRBC: 0 % (ref 0.0–0.2)

## 2021-12-06 LAB — COOXEMETRY PANEL
Carboxyhemoglobin: 0.7 % (ref 0.5–1.5)
Methemoglobin: 2.1 % — ABNORMAL HIGH (ref 0.0–1.5)
O2 Saturation: 80.7 %
Total hemoglobin: 15.4 g/dL (ref 12.0–16.0)

## 2021-12-06 LAB — PHOSPHORUS: Phosphorus: 3.5 mg/dL (ref 2.5–4.6)

## 2021-12-06 LAB — HEPARIN LEVEL (UNFRACTIONATED): Heparin Unfractionated: >1.1 IU/mL — ABNORMAL HIGH (ref 0.30–0.70)

## 2021-12-06 LAB — PROCALCITONIN: Procalcitonin: 0.12 ng/mL

## 2021-12-06 LAB — MAGNESIUM: Magnesium: 2.2 mg/dL (ref 1.7–2.4)

## 2021-12-06 LAB — APTT: aPTT: 110 seconds — ABNORMAL HIGH (ref 24–36)

## 2021-12-06 MED ORDER — HEPARIN (PORCINE) 25000 UT/250ML-% IV SOLN: 900.0000 [IU]/h | INTRAVENOUS | Status: DC

## 2021-12-06 MED ORDER — CARVEDILOL 6.25 MG PO TABS
6.2500 mg | ORAL_TABLET | Freq: Two times a day (BID) | ORAL | Status: DC
  Administered 2021-12-06 – 2021-12-07 (×3): 6.25 mg via ORAL
  Filled 2021-12-06 (×3): qty 1

## 2021-12-06 MED ORDER — HEPARIN (PORCINE) 25000 UT/250ML-% IV SOLN
900.0000 [IU]/h | INTRAVENOUS | Status: DC
  Administered 2021-12-08: 900 [IU]/h via INTRAVENOUS
  Filled 2021-12-06 (×2): qty 250

## 2021-12-06 MED ORDER — HYDROCORTISONE SOD SUC (PF) 100 MG IJ SOLR
50.0000 mg | Freq: Three times a day (TID) | INTRAMUSCULAR | Status: DC
  Administered 2021-12-06 – 2021-12-07 (×3): 50 mg via INTRAVENOUS
  Filled 2021-12-06 (×3): qty 2

## 2021-12-06 MED ORDER — HYDRALAZINE HCL 25 MG PO TABS
25.0000 mg | ORAL_TABLET | Freq: Three times a day (TID) | ORAL | Status: DC
  Administered 2021-12-06 – 2021-12-07 (×4): 25 mg via ORAL
  Filled 2021-12-06 (×4): qty 1

## 2021-12-06 MED ORDER — FUROSEMIDE 10 MG/ML IJ SOLN
80.0000 mg | Freq: Two times a day (BID) | INTRAMUSCULAR | Status: DC
  Administered 2021-12-06 – 2021-12-08 (×5): 80 mg via INTRAVENOUS
  Filled 2021-12-06 (×5): qty 8

## 2021-12-06 MED ORDER — SPIRONOLACTONE 12.5 MG HALF TABLET
12.5000 mg | ORAL_TABLET | Freq: Every day | ORAL | Status: DC
  Administered 2021-12-06: 12.5 mg via ORAL
  Filled 2021-12-06: qty 1

## 2021-12-06 NOTE — Plan of Care (Signed)

## 2021-12-06 NOTE — Progress Notes (Signed)
Hematuria noted in foley tubing and drainage bag. Heparin gtt stopped and pharmacy and Dr Aundra Dubin made aware. Will hold heparin for 4 hours per MD order. Will also delay removal of femoral arterial line d/t bleeding risk. Will continue to monitor pt. ?

## 2021-12-06 NOTE — Progress Notes (Signed)
PCCM: ? ?Doing well post extubation  ?Will transfer to heart failure service  ?Case discussed with Dr. Shirlee Latch  ? ?Josephine Igo, DO ? Pulmonary Critical Care ?12/06/2021 8:30 AM   ? ?

## 2021-12-06 NOTE — Progress Notes (Signed)
? ? Advanced Heart Failure Rounding Note ? ?PCP-Cardiologist: None  ? ?Subjective:   ? ?4/20: On multiple pressors. Shock unclear etiology. Given 120 mg IV lasix x1.  ?4/21: Improved off pressors.  Underwent TEE-guided DCCV to NSR.  TEE with EF 50%, mild RV dysfunction, mild MR.  Extubated.  ? ?NSR today, co-ox 81%.  CVP 17.  Off antibiotics.  Creatinine lower at 1.64. MAP elevated.  ? ? ?Objective:   ?Weight Range: ?108 kg ?Body mass index is 32.29 kg/m?.  ? ?Vital Signs:   ?Temp:  [97.5 ?F (36.4 ?C)-98.4 ?F (36.9 ?C)] 98.4 ?F (36.9 ?C) (04/22 0400) ?Pulse Rate:  [60-162] 79 (04/22 0600) ?Resp:  [15-35] 23 (04/22 0600) ?BP: (101-154)/(73-115) 135/99 (04/22 0600) ?SpO2:  [93 %-100 %] 95 % (04/22 0600) ?Arterial Line BP: (100-176)/(53-108) 150/86 (04/22 0500) ?FiO2 (%):  [40 %-50 %] 40 % (04/21 1615) ?Weight:  [144 kg] 108 kg (04/22 0500) ?Last BM Date :  (PTA) ? ?Weight change: ?Filed Weights  ? 12/04/21 1106 12/05/21 0500 12/06/21 0500  ?Weight: 104.3 kg 107.1 kg 108 kg  ? ? ?Intake/Output:  ? ?Intake/Output Summary (Last 24 hours) at 12/06/2021 0714 ?Last data filed at 12/06/2021 0600 ?Gross per 24 hour  ?Intake 2364.99 ml  ?Output 1275 ml  ?Net 1089.99 ml  ?  ? ? ?Physical Exam  ? CVP 17 ?General: NAD ?Neck: JVP 14+ cm, no thyromegaly or thyroid nodule.  ?Lungs: Mild crackles ?CV: Nondisplaced PMI.  Heart regular S1/S2, no S3/S4, no murmur.  No peripheral edema.   ?Abdomen: Soft, nontender, no hepatosplenomegaly, no distention.  ?Skin: Intact without lesions or rashes.  ?Neurologic: Alert and oriented x 3.  ?Psych: Normal affect. ?Extremities: No clubbing or cyanosis.  ?HEENT: Normal.  ? ?Telemetry  ? ?NSR 80s (personally reviewed) ? ?EKG  ?  ?N/A  ? ?Labs  ?  ?CBC ?Recent Labs  ?  12/04/21 ?1054 12/04/21 ?1129 12/05/21 ?0147 12/06/21 ?0424  ?WBC 7.5   < > 12.5* 17.5*  ?NEUTROABS 4.4  --   --   --   ?HGB 13.9   < > 15.1 14.7  ?HCT 42.2   < > 43.9 43.6  ?MCV 95.7   < > 90.7 93.2  ?PLT 209   < > 207 188  ? < > =  values in this interval not displayed.  ? ?Basic Metabolic Panel ?Recent Labs  ?  12/05/21 ?0147 12/05/21 ?3154 12/06/21 ?0424  ?NA 143 142 139  ?K 3.7 3.5 4.4  ?CL 113* 113* 109  ?CO2 18* 17* 22  ?GLUCOSE 97 140* 119*  ?BUN 20 22* 21*  ?CREATININE 2.45* 2.32* 1.64*  ?CALCIUM 8.7* 8.8* 8.3*  ?MG 1.2*  --  2.2  ?PHOS  --  1.6* 3.5  ? ?Liver Function Tests ?Recent Labs  ?  12/05/21 ?0086  ?AST 59*  ?ALT 51*  ?ALKPHOS 49  ?BILITOT 2.8*  ?PROT 6.3*  ?ALBUMIN 3.3*  ? ?No results for input(s): LIPASE, AMYLASE in the last 72 hours. ?Cardiac Enzymes ?No results for input(s): CKTOTAL, CKMB, CKMBINDEX, TROPONINI in the last 72 hours. ? ?BNP: ?BNP (last 3 results) ?Recent Labs  ?  12/04/21 ?1054  ?BNP 142.3*  ? ? ?ProBNP (last 3 results) ?No results for input(s): PROBNP in the last 8760 hours. ? ? ?D-Dimer ?No results for input(s): DDIMER in the last 72 hours. ?Hemoglobin A1C ?Recent Labs  ?  12/04/21 ?1340  ?HGBA1C 5.4  ? ?Fasting Lipid Panel ?Recent Labs  ?  12/06/21 ?  0424  ?CHOL 100  ?HDL 49  ?[place] 36  ?TRIG 75  ?CHOLHDL 2.0  ? ?Thyroid Function Tests ?No results for input(s): TSH, T4TOTAL, T3FREE, THYROIDAB in the last 72 hours. ? ?Invalid input(s): FREET3 ? ?Other results: ? ? ?Imaging  ? ? ?ECHO TEE ? ?Result Date: 12/06/2021 ?   TRANSESOPHOGEAL ECHO REPORT   Patient Name:   Glenn [name] Date of Exam: 12/05/2021 Medical Rec #:  [id number]     Height:       72.0 in Accession #:    [id number]    Weight:       236.1 lb Date of Birth:  1965-08-25     BSA:          2.286 m? Patient Age:    56 years      BP:           130/103 mmHg Patient Gender: M             HR:           110 bpm. Exam Location:  Inpatient Procedure: Transesophageal Echo Indications:     atrial fibrillation  History:         Patient has prior history of Echocardiogram examinations, most                  recent 12/04/2021. CHF, Defibrillator; Arrythmias:Atrial                  Fibrillation.  Sonographer:     Johny Chess RDCS Referring Phys:  [phone] AMY D  CLEGG Diagnosing Phys: Franki Monte PROCEDURE: After discussion of the risks and benefits of a TEE, an informed consent was obtained from the patient. The transesophogeal probe was passed without difficulty through the esophogus of the patient. Imaged were obtained with the patient in a left lateral decubitus position. Sedation performed by performing physician. The patient was monitored while under deep sedation. The patient developed no complications during the procedure. A direct current cardioversion was performed. IMPRESSIONS  1. Left ventricular ejection fraction, by estimation, is 50%. The left ventricle has mildly decreased function. The left ventricle demonstrates regional wall motion abnormalities with mild anterolateral hypokinesis. Moderate asymmetric basal septal left  ventricular hypertrophy.  2. Right ventricular systolic function is mildly reduced. The right ventricular size is mildly enlarged.  3. Left atrial size was moderately dilated. No left atrial/left atrial appendage thrombus was detected.  4. Peak RV-RA gradient 16 mmHg.  5. The aortic valve is tricuspid. Aortic valve regurgitation is not visualized. No aortic stenosis is present.  6. The mitral valve is normal in structure. Mild mitral valve regurgitation. No evidence of mitral stenosis.  7. No PFO or ASD by color doppler.  8. Normal caliber thoracic aorta with mild plaque. FINDINGS  Left Ventricle: Left ventricular ejection fraction, by estimation, is 50%. The left ventricle has mildly decreased function. The left ventricle demonstrates regional wall motion abnormalities. The left ventricular internal cavity size was normal in size. Moderate asymmetric basal septal left ventricular hypertrophy. Right Ventricle: The right ventricular size is mildly enlarged. No increase in right ventricular wall thickness. Right ventricular systolic function is mildly reduced. Left Atrium: Left atrial size was moderately dilated. No left atrial/left  atrial appendage thrombus was detected. Right Atrium: Right atrial size was normal in size. Pericardium: Trivial pericardial effusion is present. Mitral Valve: The mitral valve is normal in structure. Mild mitral valve regurgitation. No evidence of mitral valve stenosis. Tricuspid Valve: Peak RV-RA gradient  16 mmHg. The tricuspid valve is normal in structure. Tricuspid valve regurgitation is trivial. Aortic Valve: The aortic valve is tricuspid. Aortic valve regurgitation is not visualized. No aortic stenosis is present. Pulmonic Valve: The pulmonic valve was normal in structure. Pulmonic valve regurgitation is not visualized. Aorta: Normal caliber thoracic aorta with mild plaque. The aortic root is normal in size and structure. IAS/Shunts: No PFO or ASD by color doppler. Additional Comments: A device lead is visualized in the right ventricle.  TRICUSPID VALVE TR Peak grad:   16.3 mmHg TR Vmax:        202.00 cm/s Loa Idler McleanMD Electronically signed by Franki Monte Signature Date/Time: 12/06/2021/7:00:53 AM    Final    ? ? ?Medications:   ? ? ?Scheduled Medications: ? atorvastatin  80 mg Oral Daily  ? carvedilol  6.25 mg Oral BID WC  ? chlorhexidine gluconate (MEDLINE KIT)  15 mL Mouth Rinse BID  ? Chlorhexidine Gluconate Cloth  6 each Topical Daily  ? docusate sodium  100 mg Oral BID  ? folic acid  1 mg Oral Daily  ? furosemide  80 mg Intravenous BID  ? hydrALAZINE  25 mg Oral Q8H  ? hydrocortisone sodium succinate  100 mg Intravenous Q8H  ? insulin aspart  0-9 Units Subcutaneous Q4H  ? mouth rinse  15 mL Mouth Rinse 10 times per day  ? multivitamin with minerals  1 tablet Oral Daily  ? pantoprazole  40 mg Oral Daily  ? polyethylene glycol  17 g Oral Daily  ? sodium chloride flush  10-40 mL Intracatheter Q12H  ? spironolactone  12.5 mg Oral Daily  ? thiamine injection  100 mg Intravenous Daily  ? ? ?Infusions: ? sodium chloride 20 mL/hr at 12/06/21 0600  ? amiodarone 30 mg/hr (12/06/21 0600)  ? heparin 1,000  Units/hr (12/06/21 0600)  ? lactated ringers Stopped (12/04/21 1154)  ? ? ?PRN Medications: ?acetaminophen, hydrALAZINE, sodium chloride flush ? ? ? ?Patient Profile  ?56 y.o. male with history of cardiomyopat

## 2021-12-06 NOTE — Progress Notes (Signed)
ANTICOAGULATION CONSULT NOTE - Follow Up Consult ? ?Pharmacy Consult for Heparin ?Indication: Afib ? ?Allergies  ?Allergen Reactions  ? Ace Inhibitors Swelling  ?  Swelling lips  ? Penicillins Other (See Comments)  ?  Childhood allergy  ? Thiazide-Type Diuretics   ? ? ?Patient Measurements: ?Height: 6' (182.9 cm) ?Weight: 108 kg (238 lb 1.6 oz) ?IBW/kg (Calculated) : 77.6 ?Heparin Dosing Weight: 99.2 kg ? ?Vital Signs: ?Temp: 98.2 ?F (36.8 ?C) (04/22 1931) ?Temp Source: Oral (04/22 1931) ?BP: 122/82 (04/22 1900) ?Pulse Rate: 80 (04/22 1900) ? ?Labs: ?Recent Labs  ?  12/04/21 ?1054 12/04/21 ?1129 12/04/21 ?1340 12/04/21 ?1605 12/05/21 ?0147 12/05/21 ?4497 12/05/21 ?5300 12/05/21 ?1620 12/06/21 ?0424 12/06/21 ?1446  ?HGB 13.9   < > 14.4  --  15.1  --   --   --  14.7 14.1  ?HCT 42.2   < > 46.0  --  43.9  --   --   --  43.6 41.7  ?PLT 209  --  205  --  207  --   --   --  188 175  ?APTT  --    < > 26  --   --  164*  --  156* 110*  --   ?LABPROT  --   --  18.7*  --   --   --   --   --   --   --   ?INR  --   --  1.6*  --   --   --   --   --   --   --   ?HEPARINUNFRC  --   --  >1.10*  --   --  >1.10*  --   --  >1.10*  --   ?CREATININE 1.92*   < >  --    < > 2.45*  --  2.32*  --  1.64*  --   ?TROPONINIHS 12  --  11  --   --   --   --   --   --   --   ? < > = values in this interval not displayed.  ? ? ? ?Estimated Creatinine Clearance: 63.1 mL/min (A) (by C-G formula based on SCr of 1.64 mg/dL (H)). ? ? ?Medical History: ?Past Medical History:  ?Diagnosis Date  ? A-fib ([place]) 09/29/15  ? Cardiomyopathy ([place])   ? Dysrhythmia   ? Enlarged prostate   ? Gout   ? Hypertension   ? ? ?Medications:  ?Scheduled:  ? atorvastatin  80 mg Oral Daily  ? carvedilol  6.25 mg Oral BID WC  ? chlorhexidine gluconate (MEDLINE KIT)  15 mL Mouth Rinse BID  ? Chlorhexidine Gluconate Cloth  6 each Topical Daily  ? docusate sodium  100 mg Oral BID  ? folic acid  1 mg Oral Daily  ? furosemide  80 mg Intravenous BID  ? hydrALAZINE  25 mg Oral Q8H  ?  hydrocortisone sodium succinate  50 mg Intravenous Q8H  ? insulin aspart  0-9 Units Subcutaneous Q4H  ? multivitamin with minerals  1 tablet Oral Daily  ? pantoprazole  40 mg Oral Daily  ? polyethylene glycol  17 g Oral Daily  ? sodium chloride flush  10-40 mL Intracatheter Q12H  ? spironolactone  12.5 mg Oral Daily  ? thiamine injection  100 mg Intravenous Daily  ? ? ?Assessment: ?15 YOM presented with acute onset, severe SOB and hypotensive. History of chronic cardiomyopathy and Afib on Eliquis PTA with a  pacemaker defibrillator placed, history of hypercholesterolemia, history of hypertension and a heavy daily alcohol drinker. CBC stable, no signs of bleeding. ? ?S/p TEE and successful DCCV 4/21  - will want to maintain therapeutic anticoagulation. ? ?Will dose heparin with aptt for now as heparin level remains affected from recent apixaban  ? ?Heparin on hold earlier today with gross hematuria started earlier today.  Hematuria resolved tonight about 6pm  ?Resume low dose heparin drip 500 uts/hr and reassess in am   ?Hgb stable 14 ? ?Goal of Therapy:  ?Heparin level 0.3-0.7 units/ml ?aPTT 66-102 seconds ?Monitor platelets by anticoagulation protocol: Yes ?  ?Plan:  ?Resume heparin drip at 9pm drip rate 500 uts/hr ?Daily heparin level, aptt and CBC  ?Monitor for signs/symptoms of bleed ? ? ? ?Bonnita Nasuti Pharm.D. CPP, BCPS ?Clinical Pharmacist ?[phone] ?12/06/2021 8:44 PM  ? ? ?Please check [email or web address] for unit-specific pharmacy phone numbers. ? ? ?

## 2021-12-06 NOTE — Progress Notes (Signed)
Hematuria with slight improvement in color, now more pink tinged. Dr Aundra Dubin made aware. Per MD- hold heparin for 2 more hours and reassess.  ?

## 2021-12-06 NOTE — Progress Notes (Addendum)
ANTICOAGULATION CONSULT NOTE - Follow Up Consult ? ?Pharmacy Consult for Heparin ?Indication: Afib ? ?Allergies  ?Allergen Reactions  ? Ace Inhibitors Swelling  ?  Swelling lips  ? Penicillins Other (See Comments)  ?  Childhood allergy  ? Thiazide-Type Diuretics   ? ? ?Patient Measurements: ?Height: 6' (182.9 cm) ?Weight: 108 kg (238 lb 1.6 oz) ?IBW/kg (Calculated) : 77.6 ?Heparin Dosing Weight: 99.2 kg ? ?Vital Signs: ?Temp: 98.4 ?F (36.9 ?C) (04/22 0400) ?Temp Source: Axillary (04/22 0400) ?BP: 135/99 (04/22 0600) ?Pulse Rate: 79 (04/22 0600) ? ?Labs: ?Recent Labs  ?  12/04/21 ?1054 12/04/21 ?1129 12/04/21 ?1340 12/04/21 ?1605 12/05/21 ?0147 12/05/21 ?1540 12/05/21 ?0867 12/05/21 ?1620 12/06/21 ?0424  ?HGB 13.9   < > 14.4  --  15.1  --   --   --  14.7  ?HCT 42.2   < > 46.0  --  43.9  --   --   --  43.6  ?PLT 209  --  205  --  207  --   --   --  188  ?APTT  --    < > 26  --   --  164*  --  156* 110*  ?LABPROT  --   --  18.7*  --   --   --   --   --   --   ?INR  --   --  1.6*  --   --   --   --   --   --   ?HEPARINUNFRC  --   --  >1.10*  --   --  >1.10*  --   --  >1.10*  ?CREATININE 1.92*   < >  --    < > 2.45*  --  2.32*  --  1.64*  ?TROPONINIHS 12  --  11  --   --   --   --   --   --   ? < > = values in this interval not displayed.  ? ? ? ?Estimated Creatinine Clearance: 63.1 mL/min (A) (by C-G formula based on SCr of 1.64 mg/dL (H)). ? ? ?Medical History: ?Past Medical History:  ?Diagnosis Date  ? A-fib ([place]) 09/29/15  ? Cardiomyopathy ([place])   ? Dysrhythmia   ? Enlarged prostate   ? Gout   ? Hypertension   ? ? ?Medications:  ?Scheduled:  ? atorvastatin  80 mg Oral Daily  ? chlorhexidine gluconate (MEDLINE KIT)  15 mL Mouth Rinse BID  ? Chlorhexidine Gluconate Cloth  6 each Topical Daily  ? docusate sodium  100 mg Oral BID  ? folic acid  1 mg Oral Daily  ? hydrocortisone sodium succinate  100 mg Intravenous Q8H  ? insulin aspart  0-9 Units Subcutaneous Q4H  ? mouth rinse  15 mL Mouth Rinse 10 times per day  ?  multivitamin with minerals  1 tablet Oral Daily  ? pantoprazole  40 mg Oral Daily  ? polyethylene glycol  17 g Oral Daily  ? sodium chloride flush  10-40 mL Intracatheter Q12H  ? thiamine injection  100 mg Intravenous Daily  ? ? ?Assessment: ?64 YOM presented with acute onset, severe SOB and hypotensive. History of chronic cardiomyopathy and Afib on Eliquis PTA with a pacemaker defibrillator placed, history of hypercholesterolemia, history of hypertension and a heavy daily alcohol drinker. CBC stable, no signs of bleeding. ? ?Will dose heparin with aptt for now as heparin level remains affected from recent apixaban  ? ?aPTT 110 sec above  goal after rate decrease to 1000 units/hr. Heparin drip running throught PIV, level drawn from femoral A-Line.  CBC stable and no bleeding noted.  ?S/p TEE and successful DCCV 4/21  - will want to maintain therapeutic anticoagulation. ? ?AM Update: RN noted hematuria even with 2300cc uop from IV diuresis. Heparin drip paused 10:30. Per Dr. Aundra Dubin, will hold for 4h and resume at 1500. Will get 6h level at 2100.  ? ?Goal of Therapy:  ?Heparin level 0.3-0.7 units/ml ?aPTT 66-102 seconds ?Monitor platelets by anticoagulation protocol: Yes ?  ?Plan:  ?Decrease Heparin drip at 900 uts/hr  ?6 hour aPTT  ?Daily heparin level, aptt and CBC  ?Monitor for signs/symptoms of bleed ? ?Laurey Arrow, PharmD ?PGY1 Pharmacy Resident ?12/06/2021  7:06 AM ? ?Please check [email or web address] for unit-specific pharmacy phone numbers. ? ? ?

## 2021-12-07 LAB — BASIC METABOLIC PANEL
Anion gap: 8 (ref 5–15)
BUN: 23 mg/dL — ABNORMAL HIGH (ref 6–20)
CO2: 28 mmol/L (ref 22–32)
Calcium: 8 mg/dL — ABNORMAL LOW (ref 8.9–10.3)
Chloride: 102 mmol/L (ref 98–111)
Creatinine, Ser: 1.51 mg/dL — ABNORMAL HIGH (ref 0.61–1.24)
GFR, Estimated: 54 mL/min — ABNORMAL LOW (ref >60)
Glucose, Bld: 112 mg/dL — ABNORMAL HIGH (ref 70–99)
Potassium: 3.6 mmol/L (ref 3.5–5.1)
Sodium: 138 mmol/L (ref 135–145)

## 2021-12-07 LAB — URINALYSIS, ROUTINE W REFLEX MICROSCOPIC
Bilirubin Urine: NEGATIVE
Glucose, UA: NEGATIVE mg/dL
Ketones, ur: NEGATIVE mg/dL
Nitrite: NEGATIVE
Protein, ur: NEGATIVE mg/dL
Specific Gravity, Urine: 1.005 (ref 1.005–1.030)
pH: 5 (ref 5.0–8.0)

## 2021-12-07 LAB — CBC
HCT: 40 % (ref 39.0–52.0)
Hemoglobin: 13.3 g/dL (ref 13.0–17.0)
MCH: 30.9 pg (ref 26.0–34.0)
MCHC: 33.3 g/dL (ref 30.0–36.0)
MCV: 93 fL (ref 80.0–100.0)
Platelets: 144 10*3/uL — ABNORMAL LOW (ref 150–400)
RBC: 4.3 MIL/uL (ref 4.22–5.81)
RDW: 14.1 % (ref 11.5–15.5)
WBC: 11 10*3/uL — ABNORMAL HIGH (ref 4.0–10.5)
nRBC: 0 % (ref 0.0–0.2)

## 2021-12-07 LAB — COOXEMETRY PANEL
Carboxyhemoglobin: 1.1 % (ref 0.5–1.5)
Methemoglobin: 1.9 % — ABNORMAL HIGH (ref 0.0–1.5)
O2 Saturation: 72.6 %
Total hemoglobin: 14 g/dL (ref 12.0–16.0)

## 2021-12-07 LAB — HEPARIN LEVEL (UNFRACTIONATED)
Heparin Unfractionated: 0.27 IU/mL — ABNORMAL LOW (ref 0.30–0.70)
Heparin Unfractionated: 0.18 IU/mL — ABNORMAL LOW (ref 0.30–0.70)

## 2021-12-07 LAB — GLUCOSE, CAPILLARY
Glucose-Capillary: 108 mg/dL — ABNORMAL HIGH (ref 70–99)
Glucose-Capillary: 78 mg/dL (ref 70–99)
Glucose-Capillary: 121 mg/dL — ABNORMAL HIGH (ref 70–99)
Glucose-Capillary: 96 mg/dL (ref 70–99)
Glucose-Capillary: 136 mg/dL — ABNORMAL HIGH (ref 70–99)

## 2021-12-07 LAB — APTT
aPTT: 40 seconds — ABNORMAL HIGH (ref 24–36)
aPTT: 32 seconds (ref 24–36)

## 2021-12-07 MED ORDER — ASPIRIN 81 MG PO CHEW
81.0000 mg | CHEWABLE_TABLET | ORAL | Status: AC
  Administered 2021-12-08: 81 mg via ORAL
  Filled 2021-12-07: qty 1

## 2021-12-07 MED ORDER — SODIUM CHLORIDE 0.9 % IV SOLN: INTRAVENOUS | Status: DC

## 2021-12-07 MED ORDER — HYDROCORTISONE SOD SUC (PF) 100 MG IJ SOLR
25.0000 mg | Freq: Three times a day (TID) | INTRAMUSCULAR | Status: DC
  Administered 2021-12-07 – 2021-12-08 (×3): 25 mg via INTRAVENOUS
  Filled 2021-12-07 (×3): qty 2

## 2021-12-07 MED ORDER — SODIUM CHLORIDE 0.9% FLUSH: 3.0000 mL | INTRAVENOUS | Status: DC | PRN

## 2021-12-07 MED ORDER — SODIUM CHLORIDE 0.9% FLUSH
3.0000 mL | Freq: Two times a day (BID) | INTRAVENOUS | Status: DC
  Administered 2021-12-07 – 2021-12-08 (×3): 3 mL via INTRAVENOUS

## 2021-12-07 MED ORDER — POTASSIUM CHLORIDE CRYS ER 20 MEQ PO TBCR
20.0000 meq | EXTENDED_RELEASE_TABLET | Freq: Once | ORAL | Status: AC
  Administered 2021-12-07: 20 meq via ORAL
  Filled 2021-12-07: qty 1

## 2021-12-07 MED ORDER — AMIODARONE HCL 200 MG PO TABS
400.0000 mg | ORAL_TABLET | Freq: Two times a day (BID) | ORAL | Status: DC
Start: 2021-12-07 — End: 2021-12-09
  Administered 2021-12-07 – 2021-12-09 (×5): 400 mg via ORAL
  Filled 2021-12-07 (×5): qty 2

## 2021-12-07 MED ORDER — ISOSORBIDE MONONITRATE ER 30 MG PO TB24
30.0000 mg | ORAL_TABLET | Freq: Every day | ORAL | Status: DC
  Administered 2021-12-07 – 2021-12-09 (×3): 30 mg via ORAL
  Filled 2021-12-07 (×3): qty 1

## 2021-12-07 MED ORDER — CARVEDILOL 12.5 MG PO TABS
12.5000 mg | ORAL_TABLET | Freq: Two times a day (BID) | ORAL | Status: DC
  Administered 2021-12-07 – 2021-12-09 (×4): 12.5 mg via ORAL
  Filled 2021-12-07 (×5): qty 1

## 2021-12-07 MED ORDER — SODIUM CHLORIDE 0.9 % IV SOLN: 250.0000 mL | INTRAVENOUS | Status: DC | PRN

## 2021-12-07 MED ORDER — SPIRONOLACTONE 25 MG PO TABS
25.0000 mg | ORAL_TABLET | Freq: Every day | ORAL | Status: DC
  Administered 2021-12-07 – 2021-12-09 (×3): 25 mg via ORAL
  Filled 2021-12-07 (×3): qty 1

## 2021-12-07 MED ORDER — HYDRALAZINE HCL 50 MG PO TABS
50.0000 mg | ORAL_TABLET | Freq: Three times a day (TID) | ORAL | Status: DC
  Administered 2021-12-07 – 2021-12-08 (×3): 50 mg via ORAL
  Filled 2021-12-07 (×3): qty 1

## 2021-12-07 NOTE — Progress Notes (Signed)
Hematuria noted at 0630. Heparin gtt paused per Brayton Layman, MD (refer to note). Heparin gtt restarted per pharmacy at 0710. Day shift RN aware and will notify team. .  ?

## 2021-12-07 NOTE — Progress Notes (Signed)
ANTICOAGULATION CONSULT NOTE - Follow Up Consult ? ?Pharmacy Consult for Heparin ?Indication: Afib ? ?Allergies  ?Allergen Reactions  ? Ace Inhibitors Swelling  ?  Swelling lips  ? Penicillins Other (See Comments)  ?  Childhood allergy  ? Thiazide-Type Diuretics   ? ? ?Patient Measurements: ?Height: 6' (182.9 cm) ?Weight: 106 kg (233 lb 11 oz) ?IBW/kg (Calculated) : 77.6 ?Heparin Dosing Weight: 99.2 kg ? ?Vital Signs: ?Temp: 98.3 ?F (36.8 ?C) (04/23 1446) ?Temp Source: Oral (04/23 1446) ?BP: 107/84 (04/23 1446) ?Pulse Rate: 67 (04/23 1446) ? ?Labs: ?Recent Labs  ?  12/05/21 ?0962 12/05/21 ?1620 12/06/21 ?0424 12/06/21 ?1446 12/07/21 ?0358 12/07/21 ?1501  ?HGB  --   --  14.7 14.1 13.3  --   ?HCT  --   --  43.6 41.7 40.0  --   ?PLT  --   --  188 175 144*  --   ?APTT  --    < > 110*  --  40* 32  ?HEPARINUNFRC  --   --  >1.10*  --  0.27* 0.18*  ?CREATININE 2.32*  --  1.64*  --  1.51*  --   ? < > = values in this interval not displayed.  ? ? ? ?Estimated Creatinine Clearance: 67.9 mL/min (A) (by C-G formula based on SCr of 1.51 mg/dL (H)). ? ? ?Medical History: ?Past Medical History:  ?Diagnosis Date  ? A-fib ([place]) 09/29/15  ? Cardiomyopathy ([place])   ? Dysrhythmia   ? Enlarged prostate   ? Gout   ? Hypertension   ? ? ?Medications:  ?Scheduled:  ? amiodarone  400 mg Oral BID  ? atorvastatin  80 mg Oral Daily  ? carvedilol  12.5 mg Oral BID WC  ? chlorhexidine gluconate (MEDLINE KIT)  15 mL Mouth Rinse BID  ? Chlorhexidine Gluconate Cloth  6 each Topical Daily  ? docusate sodium  100 mg Oral BID  ? folic acid  1 mg Oral Daily  ? furosemide  80 mg Intravenous BID  ? hydrALAZINE  50 mg Oral Q8H  ? hydrocortisone sodium succinate  25 mg Intravenous Q8H  ? insulin aspart  0-9 Units Subcutaneous Q4H  ? isosorbide mononitrate  30 mg Oral Daily  ? multivitamin with minerals  1 tablet Oral Daily  ? pantoprazole  40 mg Oral Daily  ? polyethylene glycol  17 g Oral Daily  ? sodium chloride flush  10-40 mL Intracatheter Q12H  ? sodium  chloride flush  3 mL Intravenous Q12H  ? spironolactone  25 mg Oral Daily  ? thiamine injection  100 mg Intravenous Daily  ? ? ?Assessment: ?40 YOM presented with acute onset, severe SOB and hypotensive. History of chronic cardiomyopathy and Afib on Eliquis PTA with a pacemaker defibrillator placed, history of hypercholesterolemia, history of hypertension and a heavy daily alcohol drinker.  ?S/p TEE and successful DCCV 4/21 - will want to maintain therapeutic anticoagulation. ? ?Goal of Therapy:  ?Heparin level 0.3-0.7 units/ml ?Monitor platelets by anticoagulation protocol: Yes ?  ? ?The 6 hour heparin level is  0.18, pltc 32  both subtherapeutic, decreased despite increase heparin to 600 ut/hr (on low rate d/t recent high HLs and hematuria).  Patient transferred ~1330 today from [place] to [facility] [place].  3E RN said no report that heparin stopped and no hematuria or other bleeding noted.  Heparin infusing in rt IJ central line ,patient told RN lab stuck arm for HL, thus accurate peripheral stick.  ?Can use only heparin levels to monitor/adjust  rate now. Will cancel aPTTs. ? ?Plan :  ?Increase heparin drip to 800 units/hr  ?Check 6hour heparin level ?Daily HL, CBC ? ?Thank you for allowing pharmacy to be part of this patients care team. ?Nicole Cella, RPh ?Clinical Pharmacist ? ?12/07/2021  5:31 PM ? ?Please check [email or web address] for unit-specific pharmacy phone numbers. ? ? ?

## 2021-12-07 NOTE — Progress Notes (Signed)
Ask to evaluate right IJ CVAD, noted stitched intact but pulled. CVAD appears to be intact secondary to second stitch intact. Checked proximal and distal ports with good blood return and flushed without difficulty. Dressing and biopatch changed. Site WNL. Will continue to monitor. ?

## 2021-12-07 NOTE — H&P (View-Only) (Signed)
Patient ID: Glenn [name], male   DOB: 08/06/1966, 56 y.o.   MRN: [id number] ?  ? ? Advanced Heart Failure Rounding Note ? ?PCP-Cardiologist: None  ? ?Subjective:   ? ?4/20: On multiple pressors. Shock unclear etiology. Given 120 mg IV lasix x1.  ?4/21: Improved off pressors.  Underwent TEE-guided DCCV to NSR.  TEE with EF 50%, mild RV dysfunction, mild MR.  Extubated.  ? ?NSR today, co-ox 73%.  CVP 6, good diuresis yesterday with IV Lasix.  Off antibiotics.  Creatinine lower at 1.51. MAP elevated.  ? ?Main issue has been hematuria.  Started yesterday, stopped, then restarted this morning.  ? ? ?Objective:   ?Weight Range: ?107.5 kg ?Body mass index is 32.14 kg/m?.  ? ?Vital Signs:   ?Temp:  [97.9 ?F (36.6 ?C)-98.7 ?F (37.1 ?C)] 97.9 ?F (36.6 ?C) (04/23 0400) ?Pulse Rate:  [61-88] 75 (04/23 0700) ?Resp:  [9-28] 19 (04/23 0700) ?BP: (106-143)/(76-104) 143/104 (04/23 0700) ?SpO2:  [95 %-99 %] 96 % (04/23 0700) ?Arterial Line BP: (129-162)/(72-93) 137/74 (04/22 1900) ?Weight:  [107.5 kg] 107.5 kg (04/23 0500) ?Last BM Date : 12/06/21 ? ?Weight change: ?Filed Weights  ? 12/05/21 0500 12/06/21 0500 12/07/21 0500  ?Weight: 107.1 kg 108 kg 107.5 kg  ? ? ?Intake/Output:  ? ?Intake/Output Summary (Last 24 hours) at 12/07/2021 0729 ?Last data filed at 12/07/2021 0700 ?Gross per 24 hour  ?Intake 1121.94 ml  ?Output 6385 ml  ?Net -5263.06 ml  ?  ? ? ?Physical Exam  ? CVP 6 ?General: NAD ?Neck: No JVD, no thyromegaly or thyroid nodule.  ?Lungs: Clear to auscultation bilaterally with normal respiratory effort. ?CV: Nondisplaced PMI.  Heart regular S1/S2, no S3/S4, no murmur.  No peripheral edema.   ?Abdomen: Soft, nontender, no hepatosplenomegaly, no distention.  ?Skin: Intact without lesions or rashes.  ?Neurologic: Alert and oriented x 3.  ?Psych: Normal affect. ?Extremities: No clubbing or cyanosis.  ?HEENT: Normal.  ? ? ?Telemetry  ? ?NSR 70s (personally reviewed) ? ?EKG  ?  ?N/A  ? ?Labs  ?  ?CBC ?Recent Labs  ?  12/04/21 ?1054  12/04/21 ?1129 12/06/21 ?1446 12/07/21 ?0358  ?WBC 7.5   < > 14.5* 11.0*  ?NEUTROABS 4.4  --   --   --   ?HGB 13.9   < > 14.1 13.3  ?HCT 42.2   < > 41.7 40.0  ?MCV 95.7   < > 92.7 93.0  ?PLT 209   < > 175 144*  ? < > = values in this interval not displayed.  ? ?Basic Metabolic Panel ?Recent Labs  ?  12/05/21 ?0147 12/05/21 ?0835 12/06/21 ?0424 12/07/21 ?0358  ?NA 143 142 139 138  ?K 3.7 3.5 4.4 3.6  ?CL 113* 113* 109 102  ?CO2 18* 17* 22 28  ?GLUCOSE 97 140* 119* 112*  ?BUN 20 22* 21* 23*  ?CREATININE 2.45* 2.32* 1.64* 1.51*  ?CALCIUM 8.7* 8.8* 8.3* 8.0*  ?MG 1.2*  --  2.2  --   ?PHOS  --  1.6* 3.5  --   ? ?Liver Function Tests ?Recent Labs  ?  12/05/21 ?0835  ?AST 59*  ?ALT 51*  ?ALKPHOS 49  ?BILITOT 2.8*  ?PROT 6.3*  ?ALBUMIN 3.3*  ? ?No results for input(s): LIPASE, AMYLASE in the last 72 hours. ?Cardiac Enzymes ?No results for input(s): CKTOTAL, CKMB, CKMBINDEX, TROPONINI in the last 72 hours. ? ?BNP: ?BNP (last 3 results) ?Recent Labs  ?  12/04/21 ?1054  ?BNP 142.3*  ? ? ?  ProBNP (last 3 results) ?No results for input(s): PROBNP in the last 8760 hours. ? ? ?D-Dimer ?No results for input(s): DDIMER in the last 72 hours. ?Hemoglobin A1C ?Recent Labs  ?  12/04/21 ?1340  ?HGBA1C 5.4  ? ?Fasting Lipid Panel ?Recent Labs  ?  12/06/21 ?0424  ?CHOL 100  ?HDL 49  ?[place] 36  ?TRIG 75  ?CHOLHDL 2.0  ? ?Thyroid Function Tests ?No results for input(s): TSH, T4TOTAL, T3FREE, THYROIDAB in the last 72 hours. ? ?Invalid input(s): FREET3 ? ?Other results: ? ? ?Imaging  ? ? ?No results found. ? ? ?Medications:   ? ? ?Scheduled Medications: ? amiodarone  400 mg Oral BID  ? atorvastatin  80 mg Oral Daily  ? carvedilol  12.5 mg Oral BID WC  ? chlorhexidine gluconate (MEDLINE KIT)  15 mL Mouth Rinse BID  ? Chlorhexidine Gluconate Cloth  6 each Topical Daily  ? docusate sodium  100 mg Oral BID  ? folic acid  1 mg Oral Daily  ? furosemide  80 mg Intravenous BID  ? hydrALAZINE  50 mg Oral Q8H  ? hydrocortisone sodium succinate  50 mg  Intravenous Q8H  ? insulin aspart  0-9 Units Subcutaneous Q4H  ? isosorbide mononitrate  30 mg Oral Daily  ? multivitamin with minerals  1 tablet Oral Daily  ? pantoprazole  40 mg Oral Daily  ? polyethylene glycol  17 g Oral Daily  ? sodium chloride flush  10-40 mL Intracatheter Q12H  ? spironolactone  25 mg Oral Daily  ? thiamine injection  100 mg Intravenous Daily  ? ? ?Infusions: ? sodium chloride 20 mL/hr at 12/07/21 0700  ? heparin Stopped (12/07/21 9407)  ? lactated ringers Stopped (12/04/21 1154)  ? ? ?PRN Medications: ?acetaminophen, sodium chloride flush ? ? ? ?Patient Profile  ?56 y.o. male with history of cardiomyopathy s/p ICD, paroxysmal atrial fibrillation, ETOH abuse, HTN. Admitted with acute respiratory failure with hypoxia and shock. ? ? ?Assessment/Plan  ? ?1. Shock: Suspect primarily cardiogenic shock, now resolved. Off pressors, antibiotics stopped.  ?- Titrate off hydrocortisone.  ?2. 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 Echo initially with EF 45-50% range with moderate asymmetric septal hypertrophy and lateral hypokinesis, no SAM or LVOT gradient, trileaflet aortic valve with no stenosis or regurgitation, Mildly dilated and mildly dysfunctional RV, severe MR (ERO 0.4 cm^2 by PISA) with poor leaflet coaptation though LV is not dilated (posterior leaflet restricted, ?infarct-related MR with anterolateral HK), dilated IVC. Severe MR and RV dysfunction likely contribute to shock. However, TEE showed EF 50%, mild RV hypokinesis, and only mild MR. HS-TnI only 12, unlikely to be ACS.  Today, co-ox 72% with elevated MAP and CVP down to 6 after good diuresis yesterday with IV Lasix.  ?- No Lasix today.   ?- Increase Coreg to 12.5 mg bid.  ?- With elevated MAP, Increase hydralazine to 50 mg tid and add Imdur 30 daily.  ?- Increase spironolactone to 25 mg daily.  ?- With hematuria, hold off on SGLT2 inhibitor for now.   ?-  Plan LHC/RHC to assess coronaries and filling pressures/CO Monday. Discussed risks/benefits and he agrees to procedure.  ?3. ?HCM: Patient per prior notes has had a "thick heart" and it appears that ICD was placed remotely due to recurrent syncope.  ICD is at [facility], needs generator change.  ?4.  Atrial fibrillation: Paroxysmal.  Patient has been on Eliquis.  He had been in AF since 3/23.  TEE-guided DCCV to NSR on 4/21, remains in NSR.  ?- Transition amiodarone to 400 mg bid.  ?- On eliquis prior to admit. Continue heparin gtt for now (hematuria mild with stable hgb), back to Eliquis after cath.  ?5. ID: ?Septic shock component but no definite source for infection.  ?- Empiric vancomycin/cefepime initially, now off.  ?6. Mitral regurgitation: Severe on initial TTE. The posterior leaflet was restricted and the anterolateral wall was hypokinetic, ?infarct-related MR.  HS-troponin only 12 so no ACS apparent this admission.  TEE on 4/21 while intubated, however, showed only mild MR.   ?7. Acute hypoxemic respiratory failure: Now extubated, suspect primarily pulmonary edema.  ?8. ETOH abuse: Heavy drinker per daughter.  ?9. AKI on CKD stage 3: Baseline creatinine appears to be around 1.6.  Up to 2.45 with shock, back to 1.5 today.  ?10. Hematuria: Started yesterday, on and off.  Back on heparin gtt now.  Creatinine is stable.  ?- D/c foley.  ?- Check UA/urinalysis.  ? ?OK for step down from my standpoint.  Continue to follow CVP.  ? ?Gracin Mcpartland ?12/07/2021 ?7:29 AM ? ?

## 2021-12-07 NOTE — Progress Notes (Signed)
IV team consult placed. Appears as if RIJ CVC was pulled out. Heparin infusing was switched to PIV until assessed by IV team. IVF paused.  ?

## 2021-12-07 NOTE — Progress Notes (Addendum)
Patient ID: Glenn [name], male   DOB: June 23, 1966, 56 y.o.   MRN: [id number] ?  ? ? Advanced Heart Failure Rounding Note ? ?PCP-Cardiologist: None  ? ?Subjective:   ? ?4/20: On multiple pressors. Shock unclear etiology. Given 120 mg IV lasix x1.  ?4/21: Improved off pressors.  Underwent TEE-guided DCCV to NSR.  TEE with EF 50%, mild RV dysfunction, mild MR.  Extubated.  ? ?NSR today, co-ox 73%.  CVP 6, good diuresis yesterday with IV Lasix.  Off antibiotics.  Creatinine lower at 1.51. MAP elevated.  ? ?Main issue has been hematuria.  Started yesterday, stopped, then restarted this morning.  ? ? ?Objective:   ?Weight Range: ?107.5 kg ?Body mass index is 32.14 kg/m?.  ? ?Vital Signs:   ?Temp:  [97.9 ?F (36.6 ?C)-98.7 ?F (37.1 ?C)] 97.9 ?F (36.6 ?C) (04/23 0400) ?Pulse Rate:  [61-88] 75 (04/23 0700) ?Resp:  [9-28] 19 (04/23 0700) ?BP: (106-143)/(76-104) 143/104 (04/23 0700) ?SpO2:  [95 %-99 %] 96 % (04/23 0700) ?Arterial Line BP: (129-162)/(72-93) 137/74 (04/22 1900) ?Weight:  [107.5 kg] 107.5 kg (04/23 0500) ?Last BM Date : 12/06/21 ? ?Weight change: ?Filed Weights  ? 12/05/21 0500 12/06/21 0500 12/07/21 0500  ?Weight: 107.1 kg 108 kg 107.5 kg  ? ? ?Intake/Output:  ? ?Intake/Output Summary (Last 24 hours) at 12/07/2021 0729 ?Last data filed at 12/07/2021 0700 ?Gross per 24 hour  ?Intake 1121.94 ml  ?Output 6385 ml  ?Net -5263.06 ml  ?  ? ? ?Physical Exam  ? CVP 6 ?General: NAD ?Neck: No JVD, no thyromegaly or thyroid nodule.  ?Lungs: Clear to auscultation bilaterally with normal respiratory effort. ?CV: Nondisplaced PMI.  Heart regular S1/S2, no S3/S4, no murmur.  No peripheral edema.   ?Abdomen: Soft, nontender, no hepatosplenomegaly, no distention.  ?Skin: Intact without lesions or rashes.  ?Neurologic: Alert and oriented x 3.  ?Psych: Normal affect. ?Extremities: No clubbing or cyanosis.  ?HEENT: Normal.  ? ? ?Telemetry  ? ?NSR 70s (personally reviewed) ? ?EKG  ?  ?N/A  ? ?Labs  ?  ?CBC ?Recent Labs  ?  12/04/21 ?1054  12/04/21 ?1129 12/06/21 ?1446 12/07/21 ?0358  ?WBC 7.5   < > 14.5* 11.0*  ?NEUTROABS 4.4  --   --   --   ?HGB 13.9   < > 14.1 13.3  ?HCT 42.2   < > 41.7 40.0  ?MCV 95.7   < > 92.7 93.0  ?PLT 209   < > 175 144*  ? < > = values in this interval not displayed.  ? ?Basic Metabolic Panel ?Recent Labs  ?  12/05/21 ?0147 12/05/21 ?6384 12/06/21 ?0424 12/07/21 ?0358  ?NA 143 142 139 138  ?K 3.7 3.5 4.4 3.6  ?CL 113* 113* 109 102  ?CO2 18* 17* 22 28  ?GLUCOSE 97 140* 119* 112*  ?BUN 20 22* 21* 23*  ?CREATININE 2.45* 2.32* 1.64* 1.51*  ?CALCIUM 8.7* 8.8* 8.3* 8.0*  ?MG 1.2*  --  2.2  --   ?PHOS  --  1.6* 3.5  --   ? ?Liver Function Tests ?Recent Labs  ?  12/05/21 ?6659  ?AST 59*  ?ALT 51*  ?ALKPHOS 49  ?BILITOT 2.8*  ?PROT 6.3*  ?ALBUMIN 3.3*  ? ?No results for input(s): LIPASE, AMYLASE in the last 72 hours. ?Cardiac Enzymes ?No results for input(s): CKTOTAL, CKMB, CKMBINDEX, TROPONINI in the last 72 hours. ? ?BNP: ?BNP (last 3 results) ?Recent Labs  ?  12/04/21 ?1054  ?BNP 142.3*  ? ? ?  ProBNP (last 3 results) ?No results for input(s): PROBNP in the last 8760 hours. ? ? ?D-Dimer ?No results for input(s): DDIMER in the last 72 hours. ?Hemoglobin A1C ?Recent Labs  ?  12/04/21 ?1340  ?HGBA1C 5.4  ? ?Fasting Lipid Panel ?Recent Labs  ?  12/06/21 ?0424  ?CHOL 100  ?HDL 49  ?[place] 36  ?TRIG 75  ?CHOLHDL 2.0  ? ?Thyroid Function Tests ?No results for input(s): TSH, T4TOTAL, T3FREE, THYROIDAB in the last 72 hours. ? ?Invalid input(s): FREET3 ? ?Other results: ? ? ?Imaging  ? ? ?No results found. ? ? ?Medications:   ? ? ?Scheduled Medications: ? amiodarone  400 mg Oral BID  ? atorvastatin  80 mg Oral Daily  ? carvedilol  12.5 mg Oral BID WC  ? chlorhexidine gluconate (MEDLINE KIT)  15 mL Mouth Rinse BID  ? Chlorhexidine Gluconate Cloth  6 each Topical Daily  ? docusate sodium  100 mg Oral BID  ? folic acid  1 mg Oral Daily  ? furosemide  80 mg Intravenous BID  ? hydrALAZINE  50 mg Oral Q8H  ? hydrocortisone sodium succinate  50 mg  Intravenous Q8H  ? insulin aspart  0-9 Units Subcutaneous Q4H  ? isosorbide mononitrate  30 mg Oral Daily  ? multivitamin with minerals  1 tablet Oral Daily  ? pantoprazole  40 mg Oral Daily  ? polyethylene glycol  17 g Oral Daily  ? sodium chloride flush  10-40 mL Intracatheter Q12H  ? spironolactone  25 mg Oral Daily  ? thiamine injection  100 mg Intravenous Daily  ? ? ?Infusions: ? sodium chloride 20 mL/hr at 12/07/21 0700  ? heparin Stopped (12/07/21 9407)  ? lactated ringers Stopped (12/04/21 1154)  ? ? ?PRN Medications: ?acetaminophen, sodium chloride flush ? ? ? ?Patient Profile  ?56 y.o. male with history of cardiomyopathy s/p ICD, paroxysmal atrial fibrillation, ETOH abuse, HTN. Admitted with acute respiratory failure with hypoxia and shock. ? ? ?Assessment/Plan  ? ?1. Shock: Suspect primarily cardiogenic shock, now resolved. Off pressors, antibiotics stopped.  ?- Titrate off hydrocortisone.  ?2. 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 Echo initially with EF 45-50% range with moderate asymmetric septal hypertrophy and lateral hypokinesis, no SAM or LVOT gradient, trileaflet aortic valve with no stenosis or regurgitation, Mildly dilated and mildly dysfunctional RV, severe MR (ERO 0.4 cm^2 by PISA) with poor leaflet coaptation though LV is not dilated (posterior leaflet restricted, ?infarct-related MR with anterolateral HK), dilated IVC. Severe MR and RV dysfunction likely contribute to shock. However, TEE showed EF 50%, mild RV hypokinesis, and only mild MR. HS-TnI only 12, unlikely to be ACS.  Today, co-ox 72% with elevated MAP and CVP down to 6 after good diuresis yesterday with IV Lasix.  ?- No Lasix today.   ?- Increase Coreg to 12.5 mg bid.  ?- With elevated MAP, Increase hydralazine to 50 mg tid and add Imdur 30 daily.  ?- Increase spironolactone to 25 mg daily.  ?- With hematuria, hold off on SGLT2 inhibitor for now.   ?-  Plan LHC/RHC to assess coronaries and filling pressures/CO Monday. Discussed risks/benefits and he agrees to procedure.  ?3. ?HCM: Patient per prior notes has had a "thick heart" and it appears that ICD was placed remotely due to recurrent syncope.  ICD is at [facility], needs generator change.  ?4.  Atrial fibrillation: Paroxysmal.  Patient has been on Eliquis.  He had been in AF since 3/23.  TEE-guided DCCV to NSR on 4/21, remains in NSR.  ?- Transition amiodarone to 400 mg bid.  ?- On eliquis prior to admit. Continue heparin gtt for now (hematuria mild with stable hgb), back to Eliquis after cath.  ?5. ID: ?Septic shock component but no definite source for infection.  ?- Empiric vancomycin/cefepime initially, now off.  ?6. Mitral regurgitation: Severe on initial TTE. The posterior leaflet was restricted and the anterolateral wall was hypokinetic, ?infarct-related MR.  HS-troponin only 12 so no ACS apparent this admission.  TEE on 4/21 while intubated, however, showed only mild MR.   ?7. Acute hypoxemic respiratory failure: Now extubated, suspect primarily pulmonary edema.  ?8. ETOH abuse: Heavy drinker per daughter.  ?9. AKI on CKD stage 3: Baseline creatinine appears to be around 1.6.  Up to 2.45 with shock, back to 1.5 today.  ?10. Hematuria: Started yesterday, on and off.  Back on heparin gtt now.  Creatinine is stable.  ?- D/c foley.  ?- Check UA/urinalysis.  ? ?OK for step down from my standpoint.  Continue to follow CVP.  ? ?Loralie Champagne ?12/07/2021 ?7:29 AM ? ?

## 2021-12-07 NOTE — Progress Notes (Signed)
Cardiology paged at 0100 concerning return of mild hematuria (pink urine from clear urine). Brayton Layman, MD ordered to continue heparin gtt at current rate but pause if hematuria worsens. ?

## 2021-12-07 NOTE — Progress Notes (Signed)
ANTICOAGULATION CONSULT NOTE - Follow Up Consult ? ?Pharmacy Consult for Heparin ?Indication: Afib ? ?Allergies  ?Allergen Reactions  ? Ace Inhibitors Swelling  ?  Swelling lips  ? Penicillins Other (See Comments)  ?  Childhood allergy  ? Thiazide-Type Diuretics   ? ? ?Patient Measurements: ?Height: 6' (182.9 cm) ?Weight: 107.5 kg (236 lb 15.9 oz) ?IBW/kg (Calculated) : 77.6 ?Heparin Dosing Weight: 99.2 kg ? ?Vital Signs: ?Temp: 97.9 ?F (36.6 ?C) (04/23 0400) ?Temp Source: Oral (04/23 0400) ?BP: 143/104 (04/23 0700) ?Pulse Rate: 75 (04/23 0700) ? ?Labs: ?Recent Labs  ?  12/04/21 ?1054 12/04/21 ?1129 12/04/21 ?1340 12/04/21 ?1605 12/05/21 ?8338 12/05/21 ?2505 12/05/21 ?1620 12/06/21 ?0424 12/06/21 ?1446 12/07/21 ?0358  ?HGB 13.9   < > 14.4   < >  --   --   --  14.7 14.1 13.3  ?HCT 42.2   < > 46.0   < >  --   --   --  43.6 41.7 40.0  ?PLT 209  --  205   < >  --   --   --  188 175 144*  ?APTT  --    < > 26  --  164*  --  156* 110*  --  40*  ?LABPROT  --   --  18.7*  --   --   --   --   --   --   --   ?INR  --   --  1.6*  --   --   --   --   --   --   --   ?HEPARINUNFRC  --    < > >1.10*  --  >1.10*  --   --  >1.10*  --  0.27*  ?CREATININE 1.92*   < >  --    < >  --  2.32*  --  1.64*  --  1.51*  ?TROPONINIHS 12  --  11  --   --   --   --   --   --   --   ? < > = values in this interval not displayed.  ? ? ? ?Estimated Creatinine Clearance: 68.4 mL/min (A) (by C-G formula based on SCr of 1.51 mg/dL (H)). ? ? ?Medical History: ?Past Medical History:  ?Diagnosis Date  ? A-fib ([place]) 09/29/15  ? Cardiomyopathy ([place])   ? Dysrhythmia   ? Enlarged prostate   ? Gout   ? Hypertension   ? ? ?Medications:  ?Scheduled:  ? amiodarone  400 mg Oral BID  ? atorvastatin  80 mg Oral Daily  ? carvedilol  12.5 mg Oral BID WC  ? chlorhexidine gluconate (MEDLINE KIT)  15 mL Mouth Rinse BID  ? Chlorhexidine Gluconate Cloth  6 each Topical Daily  ? docusate sodium  100 mg Oral BID  ? folic acid  1 mg Oral Daily  ? furosemide  80 mg Intravenous  BID  ? hydrALAZINE  50 mg Oral Q8H  ? hydrocortisone sodium succinate  25 mg Intravenous Q8H  ? insulin aspart  0-9 Units Subcutaneous Q4H  ? isosorbide mononitrate  30 mg Oral Daily  ? multivitamin with minerals  1 tablet Oral Daily  ? pantoprazole  40 mg Oral Daily  ? polyethylene glycol  17 g Oral Daily  ? potassium chloride  20 mEq Oral Once  ? sodium chloride flush  10-40 mL Intracatheter Q12H  ? spironolactone  25 mg Oral Daily  ? thiamine injection  100 mg Intravenous  Daily  ? ? ?Assessment: ?22 YOM presented with acute onset, severe SOB and hypotensive. History of chronic cardiomyopathy and Afib on Eliquis PTA with a pacemaker defibrillator placed, history of hypercholesterolemia, history of hypertension and a heavy daily alcohol drinker. CBC stable, no signs of bleeding. ? ?S/p TEE and successful DCCV 4/21 - will want to maintain therapeutic anticoagulation. ? ?Will dose heparin with aptt for now as heparin level remains affected from recent apixaban (last dose 4/20 0600). ? ?Heparin held for 6 hours 4/22 with gross hematuria. Repeat CBC normal and drip resumed at 2200 at reduced drip rate 500 uts/h. Held again overnight 0100 for 3 hours. ?Patient reported having hematuria "years ago" at home and during an admission when his BP was high.  ? ?aPTT 40 sec below goal, heparin level 0.27 this morning. Hgb slightly down 14.1 > 13.3, platelets 144. No signs of bleeding.  ? ?Goal of Therapy:  ?Heparin level 0.3-0.7 units/ml ?aPTT 66-102 seconds ?Monitor platelets by anticoagulation protocol: Yes ?  ?Plan:  ?Increase heparin drip rate 600 uts/hr ?6 hour aptt  ?Daily heparin level, aptt and CBC  ?Monitor for signs/symptoms of bleed ? ?Laurey Arrow, PharmD ?PGY1 Pharmacy Resident ?12/07/2021  8:00 AM ? ?Please check [email or web address] for unit-specific pharmacy phone numbers. ? ? ?

## 2021-12-08 ENCOUNTER — Encounter (HOSPITAL_COMMUNITY)
Admission: EM | Disposition: A | Discharge status: Home / Self Care | Payer: Self-pay | Source: Home / Self Care | Attending: Cardiology

## 2021-12-08 DIAGNOSIS — R570 Cardiogenic shock: Secondary | ICD-10-CM

## 2021-12-08 DIAGNOSIS — I5023 Acute on chronic systolic (congestive) heart failure: Secondary | ICD-10-CM

## 2021-12-08 DIAGNOSIS — I2510 Atherosclerotic heart disease of native coronary artery without angina pectoris: Secondary | ICD-10-CM

## 2021-12-08 DIAGNOSIS — I509 Heart failure, unspecified: Secondary | ICD-10-CM

## 2021-12-08 HISTORY — PX: RIGHT/LEFT HEART CATH AND CORONARY ANGIOGRAPHY: CATH118266

## 2021-12-08 LAB — URINE CULTURE: Culture: NO GROWTH

## 2021-12-08 LAB — HEPARIN LEVEL (UNFRACTIONATED)
Heparin Unfractionated: 0.27 IU/mL — ABNORMAL LOW (ref 0.30–0.70)
Heparin Unfractionated: 0.4 IU/mL (ref 0.30–0.70)

## 2021-12-08 LAB — BASIC METABOLIC PANEL
Anion gap: 10 (ref 5–15)
BUN: 24 mg/dL — ABNORMAL HIGH (ref 6–20)
CO2: 30 mmol/L (ref 22–32)
Calcium: 8.4 mg/dL — ABNORMAL LOW (ref 8.9–10.3)
Chloride: 100 mmol/L (ref 98–111)
Creatinine, Ser: 1.5 mg/dL — ABNORMAL HIGH (ref 0.61–1.24)
GFR, Estimated: 54 mL/min — ABNORMAL LOW (ref >60)
Glucose, Bld: 97 mg/dL (ref 70–99)
Potassium: 3.3 mmol/L — ABNORMAL LOW (ref 3.5–5.1)
Sodium: 140 mmol/L (ref 135–145)

## 2021-12-08 LAB — GLUCOSE, CAPILLARY
Glucose-Capillary: 106 mg/dL — ABNORMAL HIGH (ref 70–99)
Glucose-Capillary: 127 mg/dL — ABNORMAL HIGH (ref 70–99)
Glucose-Capillary: 191 mg/dL — ABNORMAL HIGH (ref 70–99)
Glucose-Capillary: 92 mg/dL (ref 70–99)
Glucose-Capillary: 92 mg/dL (ref 70–99)
Glucose-Capillary: 96 mg/dL (ref 70–99)
Glucose-Capillary: 97 mg/dL (ref 70–99)

## 2021-12-08 LAB — POCT I-STAT EG7
Acid-Base Excess: 1 mmol/L (ref 0.0–2.0)
Acid-Base Excess: 7 mmol/L — ABNORMAL HIGH (ref 0.0–2.0)
Bicarbonate: 26.4 mmol/L (ref 20.0–28.0)
Bicarbonate: 32.2 mmol/L — ABNORMAL HIGH (ref 20.0–28.0)
Calcium, Ion: 0.81 mmol/L — CL (ref 1.15–1.40)
Calcium, Ion: 1.14 mmol/L — ABNORMAL LOW (ref 1.15–1.40)
HCT: 37 % — ABNORMAL LOW (ref 39.0–52.0)
HCT: 43 % (ref 39.0–52.0)
Hemoglobin: 12.6 g/dL — ABNORMAL LOW (ref 13.0–17.0)
Hemoglobin: 14.6 g/dL (ref 13.0–17.0)
O2 Saturation: 71 %
O2 Saturation: 72 %
Potassium: 2.6 mmol/L — CL (ref 3.5–5.1)
Potassium: 3.5 mmol/L (ref 3.5–5.1)
Sodium: 140 mmol/L (ref 135–145)
Sodium: 140 mmol/L (ref 135–145)
TCO2: 28 mmol/L (ref 22–32)
TCO2: 34 mmol/L — ABNORMAL HIGH (ref 22–32)
pCO2, Ven: 43 mmHg — ABNORMAL LOW (ref 44–60)
pCO2, Ven: 48.1 mmHg (ref 44–60)
pH, Ven: 7.395 (ref 7.25–7.43)
pH, Ven: 7.434 — ABNORMAL HIGH (ref 7.25–7.43)
pO2, Ven: 37 mmHg (ref 32–45)
pO2, Ven: 38 mmHg (ref 32–45)

## 2021-12-08 LAB — COOXEMETRY PANEL
Carboxyhemoglobin: 2.1 % — ABNORMAL HIGH (ref 0.5–1.5)
Methemoglobin: <0.7 % (ref 0.0–1.5)
O2 Saturation: 75.7 %
Total hemoglobin: 14.1 g/dL (ref 12.0–16.0)

## 2021-12-08 LAB — CBC
HCT: 41.4 % (ref 39.0–52.0)
Hemoglobin: 13.7 g/dL (ref 13.0–17.0)
MCH: 30.1 pg (ref 26.0–34.0)
MCHC: 33.1 g/dL (ref 30.0–36.0)
MCV: 91 fL (ref 80.0–100.0)
Platelets: 157 10*3/uL (ref 150–400)
RBC: 4.55 MIL/uL (ref 4.22–5.81)
RDW: 13.8 % (ref 11.5–15.5)
WBC: 10.6 10*3/uL — ABNORMAL HIGH (ref 4.0–10.5)
nRBC: 0 % (ref 0.0–0.2)

## 2021-12-08 LAB — TRIGLYCERIDES: Triglycerides: 45 mg/dL (ref <150)

## 2021-12-08 SURGERY — RIGHT/LEFT HEART CATH AND CORONARY ANGIOGRAPHY
Anesthesia: LOCAL

## 2021-12-08 MED ORDER — HYDRALAZINE HCL 20 MG/ML IJ SOLN: 10.0000 mg | INTRAMUSCULAR | Status: AC | PRN

## 2021-12-08 MED ORDER — ENOXAPARIN SODIUM 40 MG/0.4ML IJ SOSY: 40.0000 mg | PREFILLED_SYRINGE | INTRAMUSCULAR | Status: DC

## 2021-12-08 MED ORDER — LIDOCAINE HCL (PF) 1 % IJ SOLN
INTRAMUSCULAR | Status: AC
  Filled 2021-12-08: qty 30

## 2021-12-08 MED ORDER — IOHEXOL 350 MG/ML SOLN
INTRAVENOUS | Status: DC | PRN
Start: 2021-12-08 — End: 2021-12-08
  Administered 2021-12-08: 40 mL

## 2021-12-08 MED ORDER — FENTANYL CITRATE (PF) 100 MCG/2ML IJ SOLN
INTRAMUSCULAR | Status: DC | PRN
  Administered 2021-12-08 (×2): 25 ug via INTRAVENOUS

## 2021-12-08 MED ORDER — ONDANSETRON HCL 4 MG/2ML IJ SOLN: 4.0000 mg | Freq: Four times a day (QID) | INTRAMUSCULAR | Status: DC | PRN

## 2021-12-08 MED ORDER — FENTANYL CITRATE (PF) 100 MCG/2ML IJ SOLN
INTRAMUSCULAR | Status: AC
  Filled 2021-12-08: qty 2

## 2021-12-08 MED ORDER — LIDOCAINE HCL (PF) 1 % IJ SOLN
INTRAMUSCULAR | Status: DC | PRN
  Administered 2021-12-08 (×2): 2 mL

## 2021-12-08 MED ORDER — ENSURE ENLIVE PO LIQD: 237.0000 mL | Freq: Every day | ORAL | Status: DC

## 2021-12-08 MED ORDER — HEPARIN SODIUM (PORCINE) 1000 UNIT/ML IJ SOLN
INTRAMUSCULAR | Status: AC
  Filled 2021-12-08: qty 10

## 2021-12-08 MED ORDER — SODIUM CHLORIDE 0.9 % IV SOLN: INTRAVENOUS | Status: AC

## 2021-12-08 MED ORDER — SODIUM CHLORIDE 0.9% FLUSH: 3.0000 mL | INTRAVENOUS | Status: DC | PRN

## 2021-12-08 MED ORDER — EMPAGLIFLOZIN 10 MG PO TABS
10.0000 mg | ORAL_TABLET | Freq: Every day | ORAL | Status: DC
  Administered 2021-12-08 – 2021-12-09 (×2): 10 mg via ORAL
  Filled 2021-12-08 (×2): qty 1

## 2021-12-08 MED ORDER — SODIUM CHLORIDE 0.9% FLUSH
3.0000 mL | Freq: Two times a day (BID) | INTRAVENOUS | Status: DC
  Administered 2021-12-08: 3 mL via INTRAVENOUS

## 2021-12-08 MED ORDER — ACETAMINOPHEN 325 MG PO TABS: 650.0000 mg | ORAL_TABLET | ORAL | Status: DC | PRN

## 2021-12-08 MED ORDER — VERAPAMIL HCL 2.5 MG/ML IV SOLN
INTRAVENOUS | Status: AC
  Filled 2021-12-08: qty 2

## 2021-12-08 MED ORDER — MIDAZOLAM HCL 2 MG/2ML IJ SOLN
INTRAMUSCULAR | Status: DC | PRN
  Administered 2021-12-08 (×2): 1 mg via INTRAVENOUS

## 2021-12-08 MED ORDER — SODIUM CHLORIDE 0.9 % IV SOLN: 250.0000 mL | INTRAVENOUS | Status: DC | PRN

## 2021-12-08 MED ORDER — HYDRALAZINE HCL 50 MG PO TABS
75.0000 mg | ORAL_TABLET | Freq: Three times a day (TID) | ORAL | Status: DC
  Administered 2021-12-08 – 2021-12-09 (×3): 75 mg via ORAL
  Filled 2021-12-08 (×3): qty 1

## 2021-12-08 MED ORDER — POTASSIUM CHLORIDE CRYS ER 20 MEQ PO TBCR
40.0000 meq | EXTENDED_RELEASE_TABLET | Freq: Once | ORAL | Status: AC
  Administered 2021-12-08: 40 meq via ORAL
  Filled 2021-12-08: qty 2

## 2021-12-08 MED ORDER — HEPARIN (PORCINE) 25000 UT/250ML-% IV SOLN
900.0000 [IU]/h | INTRAVENOUS | Status: DC
  Administered 2021-12-08: 900 [IU]/h via INTRAVENOUS

## 2021-12-08 MED ORDER — LABETALOL HCL 5 MG/ML IV SOLN: 10.0000 mg | INTRAVENOUS | Status: AC | PRN

## 2021-12-08 MED ORDER — HEPARIN SODIUM (PORCINE) 1000 UNIT/ML IJ SOLN
INTRAMUSCULAR | Status: DC | PRN
  Administered 2021-12-08: 5000 [IU] via INTRAVENOUS

## 2021-12-08 MED ORDER — MIDAZOLAM HCL 2 MG/2ML IJ SOLN
INTRAMUSCULAR | Status: AC
  Filled 2021-12-08: qty 2

## 2021-12-08 MED ORDER — HEPARIN (PORCINE) IN NACL 1000-0.9 UT/500ML-% IV SOLN
INTRAVENOUS | Status: AC
  Filled 2021-12-08: qty 1000

## 2021-12-08 MED ORDER — HEPARIN (PORCINE) IN NACL 1000-0.9 UT/500ML-% IV SOLN
INTRAVENOUS | Status: DC | PRN
  Administered 2021-12-08 (×2): 500 mL

## 2021-12-08 MED ORDER — VERAPAMIL HCL 2.5 MG/ML IV SOLN
INTRAVENOUS | Status: DC | PRN
  Administered 2021-12-08: 10 mL via INTRA_ARTERIAL

## 2021-12-08 SURGICAL SUPPLY — 11 items
CATH 5FR JL3.5 JR4 ANG PIG MP (CATHETERS) ×1 IMPLANT
CATH BALLN WEDGE 5F 110CM (CATHETERS) ×1 IMPLANT
DEVICE RAD COMP TR BAND LRG (VASCULAR PRODUCTS) ×1 IMPLANT
GLIDESHEATH SLEND SS 6F .021 (SHEATH) ×1 IMPLANT
GUIDEWIRE INQWIRE 1.5J.035X260 (WIRE) IMPLANT
INQWIRE 1.5J .035X260CM (WIRE) ×2
KIT HEART LEFT (KITS) ×2 IMPLANT
PACK CARDIAC CATHETERIZATION (CUSTOM PROCEDURE TRAY) ×2 IMPLANT
SHEATH GLIDE SLENDER 4/5FR (SHEATH) ×1 IMPLANT
SHEATH PROBE COVER 6X72 (BAG) ×1 IMPLANT
TRANSDUCER W/STOPCOCK (MISCELLANEOUS) ×2 IMPLANT

## 2021-12-08 NOTE — Care Management Important Message (Signed)
Important Message ? ?Patient Details  ?Name: Glenn [name] ?MRN: [id number] ?Date of Birth: 1966/08/03 ? ? ?Medicare Important Message Given:  Yes ? ? ? ? ?Renie Ora ?12/08/2021, 8:49 AM ?

## 2021-12-08 NOTE — Plan of Care (Signed)
?  Problem: Health Behavior/Discharge Planning: ?Goal: Ability to manage health-related needs will improve ?Outcome: Progressing ?  ?Problem: Clinical Measurements: ?Goal: Diagnostic test results will improve ?Outcome: Progressing ?Goal: Respiratory complications will improve ?Outcome: Progressing ?Goal: Cardiovascular complication will be avoided ?Outcome: Progressing ?  ?Problem: Activity: ?Goal: Risk for activity intolerance will decrease ?Outcome: Progressing ?  ?Problem: Nutrition: ?Goal: Adequate nutrition will be maintained ?Outcome: Progressing ?  ?Problem: Coping: ?Goal: Level of anxiety will decrease ?Outcome: Progressing ?  ?Problem: Pain Managment: ?Goal: General experience of comfort will improve ?Outcome: Progressing ?  ?Problem: Safety: ?Goal: Ability to remain free from injury will improve ?Outcome: Progressing ?  ?

## 2021-12-08 NOTE — Progress Notes (Addendum)
Patient back from cath lab. Patient frequently reminded to no flex or rotate wrist to not cause bleeding.  ?

## 2021-12-08 NOTE — Interval H&P Note (Signed)
History and Physical Interval Note: ? ?12/08/2021 ?12:58 PM ? ?Glenn [name]  has presented today for surgery, with the diagnosis of HF.  The various methods of treatment have been discussed with the patient and family. After consideration of risks, benefits and other options for treatment, the patient has consented to  Procedure(s): ?RIGHT/LEFT HEART CATH AND CORONARY ANGIOGRAPHY (N/A) as a surgical intervention.  The patient's history has been reviewed, patient examined, no change in status, stable for surgery.  I have reviewed the patient's chart and labs.  Questions were answered to the patient's satisfaction.   ? ? ?[name] ? ? ?

## 2021-12-08 NOTE — Progress Notes (Addendum)
ANTICOAGULATION CONSULT NOTE - Follow Up Consult ? ?Pharmacy Consult for Heparin ?Indication: Afib ? ?Allergies  ?Allergen Reactions  ? Ace Inhibitors Swelling  ?  Swelling lips  ? Penicillins Other (See Comments)  ?  Childhood allergy  ? Thiazide-Type Diuretics   ? ? ?Patient Measurements: ?Height: 6' (182.9 cm) ?Weight: 103.9 kg (229 lb 1.6 oz) ?IBW/kg (Calculated) : 77.6 ?Heparin Dosing Weight: 99.2 kg ? ?Vital Signs: ?Temp: 97.5 ?F (36.4 ?C) (04/24 0414) ?Temp Source: Oral (04/24 0414) ?BP: 136/102 (04/24 0414) ?Pulse Rate: 60 (04/24 0414) ? ?Labs: ?Recent Labs  ?  12/06/21 ?0424 12/06/21 ?1446 12/07/21 ?0358 12/07/21 ?1501 12/08/21 ?0008 12/08/21 ?0530 12/08/21 ?0944  ?HGB 14.7 14.1 13.3  --   --  13.7  --   ?HCT 43.6 41.7 40.0  --   --  41.4  --   ?PLT 188 175 144*  --   --  157  --   ?APTT 110*  --  40* 32  --   --   --   ?HEPARINUNFRC >1.10*  --  0.27* 0.18* 0.27*  --  0.40  ?CREATININE 1.64*  --  1.51*  --   --  1.50*  --   ? ? ? ?Estimated Creatinine Clearance: 67.7 mL/min (A) (by C-G formula based on SCr of 1.5 mg/dL (H)). ? ? ?Medical History: ?Past Medical History:  ?Diagnosis Date  ? A-fib ([place]) 09/29/15  ? Cardiomyopathy ([place])   ? Dysrhythmia   ? Enlarged prostate   ? Gout   ? Hypertension   ? ? ?Medications:  ?Scheduled:  ? amiodarone  400 mg Oral BID  ? atorvastatin  80 mg Oral Daily  ? carvedilol  12.5 mg Oral BID WC  ? chlorhexidine gluconate (MEDLINE KIT)  15 mL Mouth Rinse BID  ? Chlorhexidine Gluconate Cloth  6 each Topical Daily  ? docusate sodium  100 mg Oral BID  ? folic acid  1 mg Oral Daily  ? hydrALAZINE  75 mg Oral Q8H  ? hydrocortisone sodium succinate  25 mg Intravenous Q8H  ? insulin aspart  0-9 Units Subcutaneous Q4H  ? isosorbide mononitrate  30 mg Oral Daily  ? multivitamin with minerals  1 tablet Oral Daily  ? pantoprazole  40 mg Oral Daily  ? polyethylene glycol  17 g Oral Daily  ? sodium chloride flush  10-40 mL Intracatheter Q12H  ? sodium chloride flush  3 mL Intravenous Q12H   ? spironolactone  25 mg Oral Daily  ? thiamine injection  100 mg Intravenous Daily  ? ? ?Assessment: ?89 YOM presented with acute onset, severe SOB and hypotensive. History of chronic cardiomyopathy and Afib on Eliquis PTA with a pacemaker defibrillator placed, history of hypercholesterolemia, history of hypertension and a heavy daily alcohol drinker. CBC stable, no signs of bleeding. ? ?Pt s/p TEE and successful DCCV 4/21, remains on heparin infusion with plans for cath today. Heparin level is therapeutic at 0.40, CBC stable. ? ?Goal of Therapy:  ?Heparin level 0.3-0.7 units/ml ?aPTT 66-102 seconds ?Monitor platelets by anticoagulation protocol: Yes ?  ?Plan:  ?Continue heparin 900 units/h ?Daily heparin level and CBC ? ?ADDENDUM 1500: Pt s/p LHC, to resume heparin 8h after sheath removal (~1340). Resume 900 units/h no bolus at 2130. ? ?Arrie Senate, PharmD, BCPS, BCCP ?Clinical Pharmacist ?[phone] ?Please check AMION for all [place] numbers ?12/08/2021 ? ? ? ?

## 2021-12-08 NOTE — Progress Notes (Signed)
ANTICOAGULATION CONSULT NOTE - Follow Up Consult ? ?Pharmacy Consult for Heparin ?Indication: Afib ? ?Allergies  ?Allergen Reactions  ? Ace Inhibitors Swelling  ?  Swelling lips  ? Penicillins Other (See Comments)  ?  Childhood allergy  ? Thiazide-Type Diuretics   ? ? ?Patient Measurements: ?Height: 6' (182.9 cm) ?Weight: 106 kg (233 lb 11 oz) ?IBW/kg (Calculated) : 77.6 ?Heparin Dosing Weight: 99.2 kg ? ?Vital Signs: ?Temp: 97.8 ?F (36.6 ?C) (04/24 0005) ?Temp Source: Oral (04/24 0005) ?BP: 129/84 (04/24 0005) ?Pulse Rate: 63 (04/24 0005) ? ?Labs: ?Recent Labs  ?  12/05/21 ?5945 12/05/21 ?1620 12/06/21 ?0424 12/06/21 ?1446 12/07/21 ?0358 12/07/21 ?1501 12/08/21 ?0008  ?HGB  --    < > 14.7 14.1 13.3  --   --   ?HCT  --   --  43.6 41.7 40.0  --   --   ?PLT  --   --  188 175 144*  --   --   ?APTT  --    < > 110*  --  40* 32  --   ?HEPARINUNFRC  --   --  >1.10*  --  0.27* 0.18* 0.27*  ?CREATININE 2.32*  --  1.64*  --  1.51*  --   --   ? < > = values in this interval not displayed.  ? ? ? ?Estimated Creatinine Clearance: 67.9 mL/min (A) (by C-G formula based on SCr of 1.51 mg/dL (H)). ? ? ?Medical History: ?Past Medical History:  ?Diagnosis Date  ? A-fib ([place]) 09/29/15  ? Cardiomyopathy ([place])   ? Dysrhythmia   ? Enlarged prostate   ? Gout   ? Hypertension   ? ? ?Medications:  ?Scheduled:  ? amiodarone  400 mg Oral BID  ? aspirin  81 mg Oral Pre-Cath  ? atorvastatin  80 mg Oral Daily  ? carvedilol  12.5 mg Oral BID WC  ? chlorhexidine gluconate (MEDLINE KIT)  15 mL Mouth Rinse BID  ? Chlorhexidine Gluconate Cloth  6 each Topical Daily  ? docusate sodium  100 mg Oral BID  ? folic acid  1 mg Oral Daily  ? furosemide  80 mg Intravenous BID  ? hydrALAZINE  50 mg Oral Q8H  ? hydrocortisone sodium succinate  25 mg Intravenous Q8H  ? insulin aspart  0-9 Units Subcutaneous Q4H  ? isosorbide mononitrate  30 mg Oral Daily  ? multivitamin with minerals  1 tablet Oral Daily  ? pantoprazole  40 mg Oral Daily  ? polyethylene glycol  17  g Oral Daily  ? sodium chloride flush  10-40 mL Intracatheter Q12H  ? sodium chloride flush  3 mL Intravenous Q12H  ? spironolactone  25 mg Oral Daily  ? thiamine injection  100 mg Intravenous Daily  ? ? ?Assessment: ?22 YOM presented with acute onset, severe SOB and hypotensive. History of chronic cardiomyopathy and Afib on Eliquis PTA with a pacemaker defibrillator placed, history of hypercholesterolemia, history of hypertension and a heavy daily alcohol drinker.  ?S/p TEE and successful DCCV 4/21 - will want to maintain therapeutic anticoagulation. ? ?4/24 AM update:  ?Heparin level just below goal ? ?Goal of Therapy:  ?Heparin level 0.3-0.7 units/ml ?Monitor platelets by anticoagulation protocol: Yes ?  ?Plan:  ?Inc heparin to 900 units/hr ?1000 heparin level ? ?Narda Bonds, PharmD, BCPS ?Clinical Pharmacist ?Phone: [phone] ? ? ? ?

## 2021-12-08 NOTE — Progress Notes (Signed)
Nutrition Follow-up ? ?DOCUMENTATION CODES:  ? ?Not applicable ? ?INTERVENTION:  ?Provide Ensure Enlive po once daily, each supplement provides 350 kcal and 20 grams of protein. ? ?Encourage PO intake.  ? ?NUTRITION DIAGNOSIS:  ? ?Inadequate oral intake related to inability to eat as evidenced by NPO status; diet advanced; progressing ? ?GOAL:  ? ?Patient will meet greater than or equal to 90% of their needs; progressing ? ?MONITOR:  ? ?PO intake, Supplement acceptance, Labs, Weight trends, Skin, I & O's ? ?REASON FOR ASSESSMENT:  ? ?Ventilator ?  ? ?ASSESSMENT:  ? ?55 year old male who presented to the ED on 4/20 with SOB and hypotension. Pt intubated in the ED. PMH of atrial fibrillation, cardiomyopathy s/p ICD placement in 2013, HTN, HLD, EtOH abuse. Pt admitted with cardiogenic shock, AKI. ? ?4/21 - extubated ?4/24 - s/p right/left heart cath and coronary angiography ? ?Diet has just been advanced. Previous meal completion 100%. RD to order nutritional supplements to aid in caloric and protein needs. Labs and medications reviewed.  ? ?Diet Order:   ?Diet Order   ? ?       ?  Diet 2 gram sodium Room service appropriate? Yes; Fluid consistency: Thin  Diet effective now       ?  ? ?  ?  ? ?  ? ? ?EDUCATION NEEDS:  ? ?No education needs have been identified at this time ? ?Skin:  Skin Assessment: Reviewed RN Assessment ? ?Last BM:  4/23 ? ?Height:  ? ?Ht Readings from Last 1 Encounters:  ?12/07/21 6' (1.829 m)  ? ? ?Weight:  ? ?Wt Readings from Last 1 Encounters:  ?12/08/21 103.9 kg  ? ?BMI:  Body mass index is 31.07 kg/m?. ? ?Estimated Nutritional Needs:  ? ?Kcal:  2200-2400 ? ?Protein:  120-140 grams ? ?Fluid:  >2.0 L/day ? ?Corrin Parker, MS, RD, LDN ?RD pager number/after hours weekend pager number on Amion. ? ?

## 2021-12-08 NOTE — Progress Notes (Addendum)
Patient ID: Glenn [name], male   DOB: 03-18-1966, 56 y.o.   MRN: [id number] ?  ? ? Advanced Heart Failure Rounding Note ? ?PCP-Cardiologist: None  ? ?Subjective:   ? ?4/20: On multiple pressors. Shock unclear etiology. Given 120 mg IV lasix x1.  ?4/21: Improved off pressors.  AF >> TEE-guided DCCV to NSR.  TEE with EF 50%, mild RV dysfunction, mild MR.  Extubated.  ?04/22: Hematuria. Foley out 04/23. ? ?CO-OX 76%. Not on any pressors or inotropes. ? ?CVP 6. On lasix 80 mg IV BID. Good UOP, weight down another 4 lb. ? ?Scr stable at 1.5, K 3.3.  ? ?Feeling well. No dyspnea, orthopnea or lower extremity edema.  ? ?Reports he ambulated halls yesterday. Using CPAP at night. ? ?No recurrent hematuria overnight ? ? ?Objective:   ?Weight Range: ?103.9 kg ?Body mass index is 31.07 kg/m?.  ? ?Vital Signs:   ?Temp:  [97.5 ?F (36.4 ?C)-98.3 ?F (36.8 ?C)] 97.5 ?F (36.4 ?C) (04/24 0414) ?Pulse Rate:  [60-82] 60 (04/24 0414) ?Resp:  [16-23] 18 (04/24 0414) ?BP: (107-143)/(67-104) 136/102 (04/24 0414) ?SpO2:  [94 %-100 %] 96 % (04/24 0414) ?Weight:  [103.9 kg-106 kg] 103.9 kg (04/24 0414) ?Last BM Date : 12/07/21 ? ?Weight change: ?Filed Weights  ? 12/07/21 0500 12/07/21 1446 12/08/21 0414  ?Weight: 107.5 kg 106 kg 103.9 kg  ? ? ?Intake/Output:  ? ?Intake/Output Summary (Last 24 hours) at 12/08/2021 0657 ?Last data filed at 12/08/2021 0600 ?Gross per 24 hour  ?Intake 470.56 ml  ?Output 3435 ml  ?Net -2964.44 ml  ?  ? ? ?Physical Exam  ? ?General:  No distress. Lying in bed. ?HEENT: normal ?Neck: supple. no JVD. Carotids 2+ bilat; no bruits. + R IJ CVC ?Cor: PMI nondisplaced. Regular rate & rhythm. No rubs, gallops or murmurs. ?Lungs: clear ?Abdomen: soft, nontender, nondistended. No hepatosplenomegaly.  ?Extremities: no cyanosis, clubbing, rash, edema ?Neuro: alert & orientedx3, cranial nerves grossly intact. moves all 4 extremities w/o difficulty. Affect pleasant ? ? ? ?Telemetry  ? ?SR 60s-70s ? ?Labs  ?  ?CBC ?Recent Labs  ?   12/07/21 ?0358 12/08/21 ?0530  ?WBC 11.0* 10.6*  ?HGB 13.3 13.7  ?HCT 40.0 41.4  ?MCV 93.0 91.0  ?PLT 144* 157  ? ?Basic Metabolic Panel ?Recent Labs  ?  12/05/21 ?9678 12/06/21 ?0424 12/07/21 ?0358 12/08/21 ?0530  ?NA 142 139 138 140  ?K 3.5 4.4 3.6 3.3*  ?CL 113* 109 102 100  ?CO2 17* $Remove'22 28 30  'FrGSUDc$ ?GLUCOSE 140* 119* 112* 97  ?BUN 22* 21* 23* 24*  ?CREATININE 2.32* 1.64* 1.51* 1.50*  ?CALCIUM 8.8* 8.3* 8.0* 8.4*  ?MG  --  2.2  --   --   ?PHOS 1.6* 3.5  --   --   ? ?Liver Function Tests ?Recent Labs  ?  12/05/21 ?9381  ?AST 59*  ?ALT 51*  ?ALKPHOS 49  ?BILITOT 2.8*  ?PROT 6.3*  ?ALBUMIN 3.3*  ? ?No results for input(s): LIPASE, AMYLASE in the last 72 hours. ?Cardiac Enzymes ?No results for input(s): CKTOTAL, CKMB, CKMBINDEX, TROPONINI in the last 72 hours. ? ?BNP: ?BNP (last 3 results) ?Recent Labs  ?  12/04/21 ?1054  ?BNP 142.3*  ? ? ?ProBNP (last 3 results) ?No results for input(s): PROBNP in the last 8760 hours. ? ? ?D-Dimer ?No results for input(s): DDIMER in the last 72 hours. ?Hemoglobin A1C ?No results for input(s): HGBA1C in the last 72 hours. ? ?Fasting Lipid Panel ?Recent Labs  ?  12/06/21 ?0424 12/08/21 ?0530  ?CHOL 100  --   ?HDL 49  --   ?[place] 36  --   ?TRIG 75 45  ?CHOLHDL 2.0  --   ? ?Thyroid Function Tests ?No results for input(s): TSH, T4TOTAL, T3FREE, THYROIDAB in the last 72 hours. ? ?Invalid input(s): FREET3 ? ?Other results: ? ? ?Imaging  ? ? ?No results found. ? ? ?Medications:   ? ? ?Scheduled Medications: ? amiodarone  400 mg Oral BID  ? atorvastatin  80 mg Oral Daily  ? carvedilol  12.5 mg Oral BID WC  ? chlorhexidine gluconate (MEDLINE KIT)  15 mL Mouth Rinse BID  ? Chlorhexidine Gluconate Cloth  6 each Topical Daily  ? docusate sodium  100 mg Oral BID  ? folic acid  1 mg Oral Daily  ? furosemide  80 mg Intravenous BID  ? hydrALAZINE  50 mg Oral Q8H  ? hydrocortisone sodium succinate  25 mg Intravenous Q8H  ? insulin aspart  0-9 Units Subcutaneous Q4H  ? isosorbide mononitrate  30 mg Oral  Daily  ? multivitamin with minerals  1 tablet Oral Daily  ? pantoprazole  40 mg Oral Daily  ? polyethylene glycol  17 g Oral Daily  ? sodium chloride flush  10-40 mL Intracatheter Q12H  ? sodium chloride flush  3 mL Intravenous Q12H  ? spironolactone  25 mg Oral Daily  ? thiamine injection  100 mg Intravenous Daily  ? ? ?Infusions: ? sodium chloride    ? sodium chloride 10 mL/hr at 12/08/21 0523  ? heparin 900 Units/hr (12/08/21 0206)  ? lactated ringers Stopped (12/04/21 1154)  ? ? ?PRN Medications: ?sodium chloride, acetaminophen, sodium chloride flush, sodium chloride flush ? ? ? ?Patient Profile  ?56 y.o. male with history of cardiomyopathy s/p ICD, paroxysmal atrial fibrillation, ETOH abuse, HTN. Admitted with acute respiratory failure with hypoxia and shock. ? ? ?Assessment/Plan  ? ?1. Shock: Suspect primarily cardiogenic shock, now resolved. Off pressors, antibiotics stopped.  ?- Titrate off hydrocortisone.  ?2. 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 Echo initially with EF 45-50% range with moderate asymmetric septal hypertrophy and lateral hypokinesis, no SAM or LVOT gradient, trileaflet aortic valve with no stenosis or regurgitation, Mildly dilated and mildly dysfunctional RV, severe MR (ERO 0.4 cm^2 by PISA) with poor leaflet coaptation though LV is not dilated (posterior leaflet restricted, ?infarct-related MR with anterolateral HK), dilated IVC. Severe MR and RV dysfunction likely contribute to shock. However, TEE showed EF 50%, mild RV hypokinesis, and only mild MR. HS-TnI only 12, unlikely to be ACS.  ?- CO-OX stable at 76% today.  ?- CVP 6. On IV lasix 80 mg IV BID. Hold for cath. Will consider switching po diuretic post cath. Supp K. ?- Continue coreg 12.5 mg BID ?- Increase hydralazine to 75 mg tid ?- Continue imdur 30 mg daily ?- Continue spiro 25 mg daily ?- No ARNi, hx lip swelling with ACEi ?- With hematuria, hold  off on SGLT2 inhibitor for now.   ?- LHC/RHC to assess coronaries and filling pressures/CO today.  ?- CR consult ?3. ?HCM: Patient per prior notes has had a "thick heart" and it appears that ICD was placed remotely due to recurrent syncope.  ICD is at [facility], needs generator change. Scheduled at [facility] in May 2023. ?4.  Atrial fibrillation: Paroxysmal.  Patient has been on Eliquis.  He had been in AF since 3/23.  TEE-guided DCCV to NSR on 4/21, remains in NSR.  ?- Continue amiodarone 400 mg bid.  ?- On eliquis prior to admit. Continue heparin gtt for now (hematuria mild with stable hgb), back to Eliquis after cath.  ?- Would consider future AF ablation.  ?5. ID: ?Septic shock component but no definite source for infection.  ?- Empiric vancomycin/cefepime initially, now off.  ?6. Mitral regurgitation: Severe on initial TTE. The posterior leaflet was restricted and the anterolateral wall was hypokinetic, ?infarct-related MR.  HS-troponin only 12 so no ACS apparent this admission.  TEE on 4/21 while intubated, however, showed only mild MR.  ?7. Acute hypoxemic respiratory failure: Now extubated, suspect primarily pulmonary edema.  ?8. ETOH abuse: Heavy drinker per daughter. Discussed cutting back ETOH ?9. AKI on CKD stage 3: Baseline creatinine appears to be around 1.6.  Up to 2.45 with shock, now stable. Creatinine 1.5 today ?10. Hematuria: Started on 04/22. On and off. Back on heparin gtt now.  Hgb stable. Creatinine is stable.  ?- No recurrent hematuria overnight ?- Foley out ?- UA with moderate blood in urine. Trace leukocytes and rare bacteria. Urine culture pending. ? ?FINCH, LINDSAY N ?12/08/2021 ?6:57 AM ? ?Patient seen with PA, agree with the above note.  ? ?RHC/LHC done today: ?Coronary Findings ? ?Diagnostic ?Dominance: Right ?Left Circumflex  ?Mid Cx lesion is 90% stenosed.  ?  ?Intervention ?  ?No interventions have been documented.  ? ?Right Heart ? ?Right Heart Pressures [name] ?Hemodynamics  (mmHg) ?RA mean 1 ?RV 28/8 ?PA 26/8, mean 19 ?PCWP mean 7 ?LV 120/10 ?AO 118/76 ? ?Oxygen saturations: ?PA 72% ?AO 98% ? ?Cardiac Output (Fick) 6.25  ?Cardiac Index (Fick) 2.75  ? ?No complaints today, feels g

## 2021-12-08 NOTE — Discharge Summary (Addendum)
Advanced Heart Failure Team ? ?Discharge Summary  ? ?Patient ID: Sherolyn Buba ?MRN: [id number], DOB/AGE: 1965-09-21 56 y.o. Admit date: 12/04/2021 ?D/C date:     12/09/2021  ? ?Primary Discharge Diagnoses:  ?Cardiogenic shock ?HCM ?Paroxysmal atrial fibrillation ?Mitral valve regurgitation ?Acute hypoxemic respiratory failure ?ETOH abuse ?AKI on CKD IIIa ? ? ?Hospital Course:  ?56 y.o. male with history of paroxysmal atrial fibrillation, HCM w/ ICD placement in 2013, HTN, ETOH abuse.  ICD at [facility] and has scheduled generator change 01/13/22. He follows through [place]. ?  ?Presented via EMS 12/04/21 with 2 weeks of worsening dyspnea. Found to be in cardiogenic shock on arrival and emergently intubated in the ED d/t acute respiratory failure.  Started on multiple pressors and inotrope support with DBA. Treated with course of empiric abx for possible septic component to shock. Echo per Dr. Aundra Dubin read: EF 45-50%, moderate asymmetric septal hypertrophy, lateral hypokinesis, mildly decreased RV, severe MR. Developed Afib with RVR and underwent TEE/DCCV to sinus rhythm on 04/21. TEE with EF 50%, mildly reduced RV, mild MR. Diuresed with IV lasix. Pressors weaned as able and was extubated on 04/21. Course further complicated by AKI on CKD. Once renal function improved, he underwent R/LHC on 04/24. Found to have 1 v CAD treated medically, normal filling pressures and normal CO. GDMT adjusted. ? ?Will f/u with cardiology at the [place]. Referred to EP at [facility] for consideration of AF ablation. Does poorly with AF. ? ?Medications filled via [place] through HF fund. He will obtain future refills through the [place]. ? ?See below for hospital course by problem. ? ?Hospital Course By Problem: ?1. Shock: Suspect primarily cardiogenic shock, now resolved. Off pressors, antibiotics stopped.  ?- Titrate off hydrocortisone.  ?2. 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 Echo  initially with EF 45-50% range with moderate asymmetric septal hypertrophy and lateral hypokinesis, no SAM or LVOT gradient, trileaflet aortic valve with no stenosis or regurgitation, Mildly dilated and mildly dysfunctional RV, severe MR (ERO 0.4 cm^2 by PISA) with poor leaflet coaptation though LV is not dilated (posterior leaflet restricted, ?infarct-related MR with anterolateral HK), dilated IVC. Severe MR and RV dysfunction likely contribute to shock. However, TEE showed EF 50%, mild RV hypokinesis, and only mild MR.  ?- [facility] 04/24: normal filling pressures and normal CO. 90% mid LCx after take-off of very large OM1. LCx is small and supplies minimal territory after OM1. No target for intervention. ?- CO-OX stable off inotrope support today ?- Off diuretics. Normal filling pressures on LHC.  ?- Continue coreg 12.5 mg BID ?- Continue hydralazine 75 mg tid ?- Continue imdur 30 mg daily ?- Continue spiro 25 mg daily ?- Continue jardiance 10 mg daily ?- No ARNi, hx lip swelling with ACEi ?3. ?HCM: Patient per prior notes has had a "thick heart" and it appears that ICD was placed remotely due to recurrent syncope.  ICD is at [facility], needs generator change. Scheduled at [facility] in May 2023. ?4.  Atrial fibrillation: Paroxysmal.  Patient has been on Eliquis.  He had been in AF since 3/23.  TEE-guided DCCV to NSR on 4/21, remains in NSR.  ?- Consider AF ablation in the future. Referred to EP at discharge. ?- Continue amiodarone 400 mg bid through 04/29, then decrease to 200 mg BID  through 05/06, then decrease to 200 mg daily. Check baseline LFTs and TSH. ?- Continue Eliquis 5 mg BID ?5.  ID: ?Septic shock component but no definite source for infection.  ?- Empiric vancomycin/cefepime initially, now off.  ?6. Mitral regurgitation: Severe on initial TTE. The posterior leaflet was restricted and the anterolateral wall was hypokinetic, ?infarct-related MR.  HS-troponin only 12 so no ACS apparent this admission.  TEE on 4/21 while  intubated, however, showed only mild MR.  ?7. Acute hypoxemic respiratory failure: Now extubated, suspect primarily pulmonary edema. O2 now stable on RA ?8. ETOH abuse: Heavy drinker per daughter. Discussed cutting back ETOH ?9. AKI on CKD stage 3: Baseline creatinine appears to be around 1.6.  Up to 2.45 with shock, now stable. Creatinine down to 1.5, up slightly to 1.64 today after contrast yesterday. ?10. Hematuria: Started on 04/22. On and off. Foley out. Resolved.  ?- Hgb stable with anticoagulation. ?- Urine culture with no growth ?11. OSA: ?- On CPAP ?12. CAD: Cath this admission with 90% mid LCx after take-off of very large OM1. LCx is small and supplies minimal territory after OM1. No target for intervention. ?- Continue atorvastatin.  ?- No ASA given apixaban use.  ?  ? ?Discharge Weight Range: 238 lb >>228 lb ?Discharge Vitals: Blood pressure (!) 133/101, pulse 61, temperature 97.8 ?F (36.6 ?C), temperature source Oral, resp. rate 16, height 6' (1.829 m), weight 103.7 kg, SpO2 99 %. ? ?Labs: ?Lab Results  ?Component Value Date  ? WBC 9.3 12/09/2021  ? HGB 14.3 12/09/2021  ? HCT 43.2 12/09/2021  ? MCV 91.9 12/09/2021  ? PLT 167 12/09/2021  ?  ?Recent Labs  ?Lab 12/09/21 ?0420  ?NA 140  ?K 3.5  ?CL 105  ?CO2 31  ?BUN 27*  ?CREATININE 1.64*  ?CALCIUM 8.7*  ?PROT 6.1*  ?BILITOT 1.2  ?ALKPHOS 46  ?ALT 43  ?AST 33  ?GLUCOSE 86  ? ?Lab Results  ?Component Value Date  ? CHOL 100 12/06/2021  ? HDL 49 12/06/2021  ? LDLCALC 36 12/06/2021  ? TRIG 45 12/08/2021  ? ?BNP (last 3 results) ?Recent Labs  ?  12/04/21 ?1054  ?BNP 142.3*  ? ? ?ProBNP (last 3 results) ?No results for input(s): PROBNP in the last 8760 hours. ? ? ?Diagnostic Studies/Procedures  ? ?CARDIAC CATHETERIZATION ? ?Result Date: 12/08/2021 ?  Mid Cx lesion is 90% stenosed. 1. Normal filling pressures. 2. Normal cardiac output. 3. 90% stenosis in the mid LCx after take-off of a very large OM1.  The LCx is small and supplies minimal territory after OM1.   Not interventional target.   ? ?TEE, 12/05/21: ?IMPRESSIONS  ? 1. Left ventricular ejection fraction, by estimation, is 50%. The left ventricle has mildly decreased function. The left ventricle demonstrates regional wall motion abnormalities with mild anterolateral hypokinesis.  ?Moderate asymmetric basal septal left ventricular hypertrophy.  ? 2. Right ventricular systolic function is mildly reduced. The right ventricular size is mildly enlarged.  ? 3. Left atrial size was moderately dilated. No left atrial/left atrial appendage thrombus was detected.  ? 4. Peak RV-RA gradient 16 mmHg.  ? 5. The aortic valve is tricuspid. Aortic valve regurgitation is not visualized. No aortic stenosis is present.  ? 6. The mitral valve is normal in structure. Mild mitral valve regurgitation. No evidence of mitral stenosis.  ? 7. No PFO or ASD by color doppler.  ? 8. Normal caliber thoracic aorta with mild plaque.  ? ?Discharge Medications  ? ?Allergies as of 12/09/2021   ? ?   Reactions  ? Ace Inhibitors Swelling  ? Swelling lips  ? Penicillins  Other (See Comments)  ? Childhood allergy  ? Thiazide-type Diuretics   ? ?  ? ?  ?Medication List  ?  ? ?STOP taking these medications   ? ?benzonatate 100 MG capsule ?Commonly known as: TESSALON ?  ?buPROPion 300 MG 24 hr tablet ?Commonly known as: WELLBUTRIN XL ?  ?colchicine 0.6 MG tablet ?  ?diltiazem 240 MG 24 hr capsule ?Commonly known as: CARDIZEM CD ?  ?levofloxacin 750 MG tablet ?Commonly known as: Levaquin ?  ?loperamide 2 MG capsule ?Commonly known as: IMODIUM ?  ?losartan 100 MG tablet ?Commonly known as: COZAAR ?  ?magnesium oxide 400 MG tablet ?Commonly known as: MAG-OX ?  ?meclizine 12.5 MG tablet ?Commonly known as: ANTIVERT ?  ?omeprazole 20 MG capsule ?Commonly known as: PRILOSEC ?  ?ondansetron 4 MG disintegrating tablet ?Commonly known as: Zofran ODT ?  ?oseltamivir 75 MG capsule ?Commonly known as: TAMIFLU ?  ?tamsulosin 0.4 MG Caps capsule ?Commonly known as: Flomax ?   ?terazosin 5 MG capsule ?Commonly known as: HYTRIN ?  ?terbinafine 1 % cream ?Commonly known as: LAMISIL ?  ? ?  ? ?TAKE these medications   ? ?amiodarone 200 MG tablet ?Commonly known as: PACERONE ?Ta

## 2021-12-08 NOTE — Progress Notes (Signed)
Was informed by secretary that transport will be on the way up to take patient for cath lab. When RN entered room, patient was already gone. Telemetry box and heparin dose was not stopped. Cardiac cath lab called.  ?

## 2021-12-09 ENCOUNTER — Other Ambulatory Visit (HOSPITAL_COMMUNITY): Payer: Self-pay

## 2021-12-09 ENCOUNTER — Encounter (HOSPITAL_COMMUNITY): Payer: Self-pay | Admitting: Cardiology

## 2021-12-09 LAB — CBC
HCT: 43.2 % (ref 39.0–52.0)
Hemoglobin: 14.3 g/dL (ref 13.0–17.0)
MCH: 30.4 pg (ref 26.0–34.0)
MCHC: 33.1 g/dL (ref 30.0–36.0)
MCV: 91.9 fL (ref 80.0–100.0)
Platelets: 167 10*3/uL (ref 150–400)
RBC: 4.7 MIL/uL (ref 4.22–5.81)
RDW: 13.5 % (ref 11.5–15.5)
WBC: 9.3 10*3/uL (ref 4.0–10.5)
nRBC: 0 % (ref 0.0–0.2)

## 2021-12-09 LAB — COOXEMETRY PANEL
Carboxyhemoglobin: 1.4 % (ref 0.5–1.5)
Methemoglobin: 1.8 % — ABNORMAL HIGH (ref 0.0–1.5)
O2 Saturation: 65.6 %
Total hemoglobin: 15.1 g/dL (ref 12.0–16.0)

## 2021-12-09 LAB — BASIC METABOLIC PANEL
Anion gap: 4 — ABNORMAL LOW (ref 5–15)
BUN: 27 mg/dL — ABNORMAL HIGH (ref 6–20)
CO2: 31 mmol/L (ref 22–32)
Calcium: 8.7 mg/dL — ABNORMAL LOW (ref 8.9–10.3)
Chloride: 105 mmol/L (ref 98–111)
Creatinine, Ser: 1.64 mg/dL — ABNORMAL HIGH (ref 0.61–1.24)
GFR, Estimated: 48 mL/min — ABNORMAL LOW (ref >60)
Glucose, Bld: 86 mg/dL (ref 70–99)
Potassium: 3.5 mmol/L (ref 3.5–5.1)
Sodium: 140 mmol/L (ref 135–145)

## 2021-12-09 LAB — HEPATIC FUNCTION PANEL
ALT: 43 U/L (ref 0–44)
AST: 33 U/L (ref 15–41)
Albumin: 3.2 g/dL — ABNORMAL LOW (ref 3.5–5.0)
Alkaline Phosphatase: 46 U/L (ref 38–126)
Bilirubin, Direct: 0.2 mg/dL (ref 0.0–0.2)
Indirect Bilirubin: 1 mg/dL — ABNORMAL HIGH (ref 0.3–0.9)
Total Bilirubin: 1.2 mg/dL (ref 0.3–1.2)
Total Protein: 6.1 g/dL — ABNORMAL LOW (ref 6.5–8.1)

## 2021-12-09 LAB — GLUCOSE, CAPILLARY
Glucose-Capillary: 72 mg/dL (ref 70–99)
Glucose-Capillary: 93 mg/dL (ref 70–99)

## 2021-12-09 LAB — HEPARIN LEVEL (UNFRACTIONATED): Heparin Unfractionated: 0.76 IU/mL — ABNORMAL HIGH (ref 0.30–0.70)

## 2021-12-09 LAB — TSH: TSH: 1.401 u[IU]/mL (ref 0.350–4.500)

## 2021-12-09 MED ORDER — EMPAGLIFLOZIN 10 MG PO TABS
10.0000 mg | ORAL_TABLET | Freq: Every day | ORAL | 1 refills | Status: DC
Start: 2021-12-09 — End: 2024-04-11
  Filled 2021-12-09: qty 30, 30d supply, fill #0

## 2021-12-09 MED ORDER — HYDRALAZINE HCL 25 MG PO TABS
75.0000 mg | ORAL_TABLET | Freq: Three times a day (TID) | ORAL | 1 refills | Status: DC
  Filled 2021-12-09: qty 270, 30d supply, fill #0

## 2021-12-09 MED ORDER — SPIRONOLACTONE 25 MG PO TABS
25.0000 mg | ORAL_TABLET | Freq: Every day | ORAL | 1 refills | Status: AC
  Filled 2021-12-09: qty 30, 30d supply, fill #0

## 2021-12-09 MED ORDER — CARVEDILOL 12.5 MG PO TABS
12.5000 mg | ORAL_TABLET | Freq: Two times a day (BID) | ORAL | 1 refills | Status: DC
  Filled 2021-12-09: qty 60, 30d supply, fill #0

## 2021-12-09 MED ORDER — APIXABAN 5 MG PO TABS
5.0000 mg | ORAL_TABLET | Freq: Two times a day (BID) | ORAL | Status: DC
  Administered 2021-12-09: 5 mg via ORAL
  Filled 2021-12-09: qty 1

## 2021-12-09 MED ORDER — ISOSORBIDE MONONITRATE ER 30 MG PO TB24
30.0000 mg | ORAL_TABLET | Freq: Every day | ORAL | 1 refills | Status: DC
  Filled 2021-12-09: qty 30, 30d supply, fill #0

## 2021-12-09 MED ORDER — AMIODARONE HCL 200 MG PO TABS
ORAL_TABLET | ORAL | 1 refills | Status: DC
  Filled 2021-12-09: qty 50, 24d supply, fill #0

## 2021-12-09 NOTE — Progress Notes (Signed)
CARDIAC REHAB PHASE I  ? ?Pt educated on importance of daily weights and monitoring symptoms. Encouraged alcohol cessation, pt states he will try moderation. Encouraged ambulation. Pt denies further questions or concerns.  ? ?[phone] ?Reynold Bowen, RN BSN ?12/09/2021 ?9:10 AM ? ? ?

## 2021-12-09 NOTE — TOC Initial Note (Signed)
Transition of Care (TOC) - Initial/Assessment Note  ? ? ?Patient Details  ?Name: Glenn [name] ?MRN: [id number] ?Date of Birth: 1966-01-31 ? ?Transition of Care [facility]) CM/SW Contact:    ?Glenn Grammes Rexene Alberts, RN ?Phone Number: [phone] ?12/09/2021, 9:56 AM ? ?Clinical Narrative:                 ? ?HF TOC CM spoke to pt at bedside. States he has PCP at [facility], Dr Glenn [name]. States he utilizes the [place] for his meds. He drives to his appts. He has scale at home and plans to weigh daily. Pt aware of importance of low sodium diet. Meds are to come up from [place]. HF funds covered meds. Discussed with pt Medicare Part B and D. States he is 100% disabled with [facility] and he follows up at [facility]. Pt will scheduled appt with [facility], states [facility] has called him to schedule.  ? ? ? ?Expected Discharge Plan: Home/Self Care ?Barriers to Discharge: No Barriers Identified ? ? ?Patient Goals and CMS Choice ?Patient states their goals for this hospitalization and ongoing recovery are:: wants to return to his baseline ?  ?Choice offered to / list presented to : Patient ? ?Expected Discharge Plan and Services ?Expected Discharge Plan: Home/Self Care ?  ?Discharge Planning Services: CM Consult ?  ?Living arrangements for the past 2 months: [place] ?Expected Discharge Date: 12/09/21               ? ? ?Prior Living Arrangements/Services ?Living arrangements for the past 2 months: [place] ?Lives with:: Relatives ?Patient language and need for interpreter reviewed:: Yes ?Do you feel safe going back to the place where you live?: Yes      ?Need for Family Participation in Patient Care: No (Comment) ?Care giver support system in place?: No (comment) ?Current home services: DME (CPAP, scale) ?Criminal Activity/Legal Involvement Pertinent to Current Situation/Hospitalization: No - Comment as needed ? ?Activities of Daily Living ?  ?  ? ?Permission Sought/Granted ?Permission sought to share information  with : Case Manager, Family Supports, PCP ?Permission granted to share information with : Yes, Verbal Permission Granted ? Share Information with NAME: Glenn [name] ?   ? Permission granted to share info w Relationship: daughter ? Permission granted to share info w Contact Information: [phone] ? ?Emotional Assessment ?Appearance:: Appears stated age ?Attitude/Demeanor/Rapport: Engaged ?Affect (typically observed): Accepting ?Orientation: : Oriented to Self, Oriented to Place, Oriented to  Time, Oriented to Situation ?  ?Psych Involvement: No (comment) ? ?Admission diagnosis:  Cardiogenic shock ([place]) [R57.0] ?Acute pulmonary edema (HCC) [J81.0] ?Acute on chronic congestive heart failure, unspecified heart failure type ([place]) [I50.9] ?Patient Active Problem List  ? Diagnosis Date Noted  ? Acute respiratory failure with hypoxia ([place])   ? AKI (acute kidney injury) ([place])   ? Cardiogenic shock ([place]) 12/04/2021  ? Acute on chronic congestive heart failure ([place])   ? Atrial fibrillation with RVR ([place]) 09/29/2015  ? Atrial fibrillation ([place]) 09/29/2015  ? Paroxysmal atrial fibrillation (HCC)   ? Inappropriate shocks from ICD (implantable cardioverter-defibrillator)   ? Essential hypertension   ? ?PCP:  Patient, No Pcp Per (Inactive) ?Pharmacy:   ?[place] ([place]), [place] - [address] ?[address] ?[place] [address] ?Phone: [phone] Fax: [phone] ? ?[place], [place] AT [facility] ?[place] ?[place] [address] ?Phone: [phone] Fax:  [phone] ? ?[facility] ?[address] ?[place] [address] ?Phone: [phone] Fax: [phone] ? ? ? ? ?Social Determinants of Health (SDOH) Interventions ?  ? ?Readmission Risk Interventions ?   ? View : No data to display.  ?  ?  ?  ? ? ? ?

## 2021-12-09 NOTE — Progress Notes (Addendum)
Patient ID: Glenn [name], male   DOB: 27-May-1966, 56 y.o.   MRN: [id number] ?  ? ? Advanced Heart Failure Rounding Note ? ?PCP-Cardiologist: None  ? ?Subjective:   ? ?4/20: On multiple pressors. Shock unclear etiology. Given 120 mg IV lasix x1.  ?4/21: Improved off pressors.  AF >> TEE-guided DCCV to NSR.  TEE with EF 50%, mild RV dysfunction, mild MR.  Extubated.  ?4/22: Hematuria. Foley out 04/23. ? ?CO-OX 66% this am. ? ?Scr up slightly, 1.5>1.64. BP stable. ? ?Feeling well. No complaints this am. No hematoma at cath sites. ? ? ? ?R/LHC 12/08/21 ?Coronary Findings ?  ?Diagnostic ?Dominance: Right ?Left Circumflex  ?Mid Cx lesion is 90% stenosed.  ?  ?Intervention ?   ?No interventions have been documented.  ?  ?Right Heart ?  ?Right Heart Pressures [name] ?Hemodynamics (mmHg) ?RA mean 1 ?RV 28/8 ?PA 26/8, mean 19 ?PCWP mean 7 ?LV 120/10 ?AO 118/76 ? ?Oxygen saturations: ?PA 72% ?AO 98% ? ?Cardiac Output (Fick) 6.25  ?Cardiac Index (Fick) 2.75  ?  ? ? ? ?Objective:   ?Weight Range: ?103.7 kg ?Body mass index is 31 kg/m?.  ? ?Vital Signs:   ?Temp:  [97.6 ?F (36.4 ?C)-97.8 ?F (36.6 ?C)] 97.8 ?F (36.6 ?C) (04/25 0408) ?Pulse Rate:  [59-75] 61 (04/25 0408) ?Resp:  [16] 16 (04/25 0408) ?BP: (123-133)/(75-102) 133/101 (04/25 0408) ?SpO2:  [96 %-100 %] 99 % (04/25 0408) ?Weight:  [103.7 kg] 103.7 kg (04/25 0408) ?Last BM Date : 12/08/21 ? ?Weight change: ?Filed Weights  ? 12/07/21 1446 12/08/21 0414 12/09/21 0408  ?Weight: 106 kg 103.9 kg 103.7 kg  ? ? ?Intake/Output:  ? ?Intake/Output Summary (Last 24 hours) at 12/09/2021 0744 ?Last data filed at 12/09/2021 0400 ?Gross per 24 hour  ?Intake 779.93 ml  ?Output 1425 ml  ?Net -645.07 ml  ?  ? ? ?Physical Exam  ? ?General:  No distress. Lying comfortably in bed. ?HEENT: normal ?Neck: supple. no JVD. Carotids 2+ bilat; no bruits.  ?Cor: PMI nondisplaced. + R IJ CVC. Regular rate & rhythm. No rubs, gallops or murmurs. ?Lungs: clear ?Abdomen: soft, nontender,  nondistended.  ?Extremities: no cyanosis, clubbing, rash, edema, [name] ?Neuro: alert & orientedx3, cranial nerves grossly intact. moves all 4 extremities w/o difficulty. Affect pleasant ? ? ? ? ?Telemetry  ? ?SR 60s - 70s (personally reviewed) ? ?Labs  ?  ?CBC ?Recent Labs  ?  12/08/21 ?0530 12/08/21 ?1315 12/08/21 ?1316 12/09/21 ?0420  ?WBC 10.6*  --   --  9.3  ?HGB 13.7   < > 14.6 14.3  ?HCT 41.4   < > 43.0 43.2  ?MCV 91.0  --   --  91.9  ?PLT 157  --   --  167  ? < > = values in this interval not displayed.  ? ?Basic Metabolic Panel ?Recent Labs  ?  12/08/21 ?0530 12/08/21 ?1315 12/08/21 ?1316 12/09/21 ?0420  ?NA 140   < > 140 140  ?K 3.3*   < > 3.5 3.5  ?CL 100  --   --  105  ?CO2 30  --   --  31  ?GLUCOSE 97  --   --  86  ?BUN 24*  --   --  27*  ?CREATININE 1.50*  --   --  1.64*  ?CALCIUM 8.4*  --   --  8.7*  ? < > = values in this interval not displayed.  ? ?  Liver Function Tests ?No results for input(s): AST, ALT, ALKPHOS, BILITOT, PROT, ALBUMIN in the last 72 hours. ? ?No results for input(s): LIPASE, AMYLASE in the last 72 hours. ?Cardiac Enzymes ?No results for input(s): CKTOTAL, CKMB, CKMBINDEX, TROPONINI in the last 72 hours. ? ?BNP: ?BNP (last 3 results) ?Recent Labs  ?  12/04/21 ?1054  ?BNP 142.3*  ? ? ?ProBNP (last 3 results) ?No results for input(s): PROBNP in the last 8760 hours. ? ? ?D-Dimer ?No results for input(s): DDIMER in the last 72 hours. ?Hemoglobin A1C ?No results for input(s): HGBA1C in the last 72 hours. ? ?Fasting Lipid Panel ?Recent Labs  ?  12/08/21 ?0530  ?TRIG 45  ? ?Thyroid Function Tests ?No results for input(s): TSH, T4TOTAL, T3FREE, THYROIDAB in the last 72 hours. ? ?Invalid input(s): FREET3 ? ?Other results: ? ? ?Imaging  ? ? ?CARDIAC CATHETERIZATION ? ?Result Date: 12/08/2021 ?  Mid Cx lesion is 90% stenosed. 1. Normal filling pressures. 2. Normal cardiac output. 3. 90% stenosis in the mid LCx after take-off of a very large OM1.  The LCx is small and  supplies minimal territory after OM1.  Not interventional target.   ? ? ?Medications:   ? ? ?Scheduled Medications: ? amiodarone  400 mg Oral BID  ? apixaban  5 mg Oral BID  ? atorvastatin  80 mg Oral Daily  ? carvedilol  12.5 mg Oral BID WC  ? chlorhexidine gluconate (MEDLINE KIT)  15 mL Mouth Rinse BID  ? Chlorhexidine Gluconate Cloth  6 each Topical Daily  ? docusate sodium  100 mg Oral BID  ? empagliflozin  10 mg Oral Daily  ? feeding supplement  237 mL Oral Q1500  ? folic acid  1 mg Oral Daily  ? hydrALAZINE  75 mg Oral Q8H  ? insulin aspart  0-9 Units Subcutaneous Q4H  ? isosorbide mononitrate  30 mg Oral Daily  ? multivitamin with minerals  1 tablet Oral Daily  ? pantoprazole  40 mg Oral Daily  ? polyethylene glycol  17 g Oral Daily  ? sodium chloride flush  10-40 mL Intracatheter Q12H  ? sodium chloride flush  3 mL Intravenous Q12H  ? sodium chloride flush  3 mL Intravenous Q12H  ? spironolactone  25 mg Oral Daily  ? thiamine injection  100 mg Intravenous Daily  ? ? ?Infusions: ? sodium chloride    ? lactated ringers Stopped (12/04/21 1154)  ? ? ?PRN Medications: ?sodium chloride, acetaminophen, acetaminophen, ondansetron (ZOFRAN) IV, sodium chloride flush, sodium chloride flush ? ? ? ?Patient Profile  ?56 y.o. male with history of cardiomyopathy s/p ICD, paroxysmal atrial fibrillation, ETOH abuse, HTN. Admitted with acute respiratory failure with hypoxia and shock. ? ? ?Assessment/Plan  ? ?1. Shock: Suspect primarily cardiogenic shock, now resolved. Off pressors, antibiotics stopped.  ?- Titrate off hydrocortisone.  ?2. 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 Echo initially with EF 45-50% range with moderate asymmetric septal hypertrophy and lateral hypokinesis, no SAM or LVOT gradient, trileaflet aortic valve with no stenosis or regurgitation, Mildly dilated and mildly dysfunctional RV, severe MR (ERO 0.4 cm^2 by PISA) with poor  leaflet coaptation though LV is not dilated (posterior leaflet restricted, ?infarct-related MR with anterolateral HK), dilated IVC. Severe MR and RV dysfunction likely contribute to shock. However, TEE showed EF 50%, mild RV hypokinesis, and only mild MR.  ?- [facility] 04/24: normal filling pressures and normal CO. 90%  mid LCx after take-off of very large OM1. LCx is small and supplies minimal territory after OM1. No target for intervention. ?- CO-OX stable. Not on any inotropes ?- Off diuretics. Normal filling pressures on LHC. Will need PRN furosemide at discharge. ?- Continue coreg 12.5 mg BID ?- Continue hydralazine 75 mg tid ?- Continue imdur 30 mg daily ?- Continue spiro 25 mg daily ?- Continue jardiance 10 mg daily ?- No ARNi, hx lip swelling with ACEi ?- CR consult ?3. ?HCM: Patient per prior notes has had a "thick heart" and it appears that ICD was placed remotely due to recurrent syncope.  ICD is at [facility], needs generator change. Scheduled at [facility] in May 2023. ?4.  Atrial fibrillation: Paroxysmal.  Patient has been on Eliquis.  He had been in AF since 3/23.  TEE-guided DCCV to NSR on 4/21, remains in NSR.  ?- Consider AF ablation in the future ?- Continue amiodarone 400 mg bid through 04/29, then decrease to 200 mg BID  through 05/06, then decrease to 200 mg daily. Check baseline LFTs and TSH. ?- Stop heparin gtt>> switch back to Eliquis 5 mg BID ?5. ID: ?Septic shock component but no definite source for infection.  ?- Empiric vancomycin/cefepime initially, now off.  ?6. Mitral regurgitation: Severe on initial TTE. The posterior leaflet was restricted and the anterolateral wall was hypokinetic, ?infarct-related MR.  HS-troponin only 12 so no ACS apparent this admission.  TEE on 4/21 while intubated, however, showed only mild MR.  ?7. Acute hypoxemic respiratory failure: Now extubated, suspect primarily pulmonary edema. O2 now stable on RA ?8. ETOH abuse: Heavy drinker per daughter. Discussed cutting back ETOH ?9.  AKI on CKD stage 3: Baseline creatinine appears to be around 1.6.  Up to 2.45 with shock, now stable. Creatinine down to 1.5, up slightly to 1.64 today after contrast yesterday. ?10. Hematuria: Started

## 2021-12-15 ENCOUNTER — Other Ambulatory Visit: Payer: Self-pay

## 2021-12-15 ENCOUNTER — Emergency Department (HOSPITAL_COMMUNITY)
Admission: EM | Admit: 2021-12-15 | Discharge: 2021-12-15 | Discharge status: Against Medical Advice | Payer: No Typology Code available for payment source | Attending: Emergency Medicine | Admitting: Emergency Medicine

## 2021-12-15 ENCOUNTER — Emergency Department (HOSPITAL_COMMUNITY): Payer: No Typology Code available for payment source

## 2021-12-15 ENCOUNTER — Emergency Department (HOSPITAL_BASED_OUTPATIENT_CLINIC_OR_DEPARTMENT_OTHER): Payer: No Typology Code available for payment source

## 2021-12-15 ENCOUNTER — Encounter (HOSPITAL_COMMUNITY): Payer: Self-pay

## 2021-12-15 DIAGNOSIS — M791 Myalgia, unspecified site: Secondary | ICD-10-CM | POA: Diagnosis not present

## 2021-12-15 DIAGNOSIS — Z7901 Long term (current) use of anticoagulants: Secondary | ICD-10-CM | POA: Insufficient documentation

## 2021-12-15 DIAGNOSIS — M25562 Pain in left knee: Secondary | ICD-10-CM | POA: Insufficient documentation

## 2021-12-15 DIAGNOSIS — M79605 Pain in left leg: Secondary | ICD-10-CM

## 2021-12-15 DIAGNOSIS — Z5321 Procedure and treatment not carried out due to patient leaving prior to being seen by health care provider: Secondary | ICD-10-CM | POA: Insufficient documentation

## 2021-12-15 LAB — BASIC METABOLIC PANEL
Anion gap: 6 (ref 5–15)
BUN: 15 mg/dL (ref 6–20)
CO2: 25 mmol/L (ref 22–32)
Calcium: 9.7 mg/dL (ref 8.9–10.3)
Chloride: 107 mmol/L (ref 98–111)
Creatinine, Ser: 1.56 mg/dL — ABNORMAL HIGH (ref 0.61–1.24)
GFR, Estimated: 51 mL/min — ABNORMAL LOW (ref >60)
Glucose, Bld: 83 mg/dL (ref 70–99)
Potassium: 3.8 mmol/L (ref 3.5–5.1)
Sodium: 138 mmol/L (ref 135–145)

## 2021-12-15 LAB — CBC
HCT: 42.6 % (ref 39.0–52.0)
Hemoglobin: 13.6 g/dL (ref 13.0–17.0)
MCH: 30 pg (ref 26.0–34.0)
MCHC: 31.9 g/dL (ref 30.0–36.0)
MCV: 94 fL (ref 80.0–100.0)
Platelets: 193 10*3/uL (ref 150–400)
RBC: 4.53 MIL/uL (ref 4.22–5.81)
RDW: 14 % (ref 11.5–15.5)
WBC: 9 10*3/uL (ref 4.0–10.5)
nRBC: 0 % (ref 0.0–0.2)

## 2021-12-15 NOTE — Progress Notes (Signed)
LLE venous duplex has been completed.  Preliminary results given to Delice Bison, PA-C through secure messenger. ? ? ?Results can be found under chart review under CV PROC. ?12/15/2021 3:09 PM ?Briyah Wheelwright RVT, RDMS ? ?

## 2021-12-15 NOTE — ED Provider Triage Note (Signed)
Emergency Medicine Provider Triage Evaluation Note ? ?Glenn [name] , a 56 y.o. male  was evaluated in triage.  Pt complains of left knee pain.  Patient states he was recently hospitalized for cardiac issues.  Patient reports that while in the hospital he began developing left knee pain.  Patient states he is currently anticoagulated and has been anticoagulated for quite some time.  Patient endorses body aches and chills, denies any fevers nausea or vomiting. ? ?Review of Systems  ?Positive:  ?Negative:  ? ?Physical Exam  ?BP 137/90 (BP Location: Right Arm)   Pulse 91   Temp 98.8 ?F (37.1 ?C) (Oral)   Resp 16   Ht 6' (1.829 m)   Wt 107 kg   SpO2 96%   BMI 32.01 kg/m?  ?Gen:   Awake, no distress   ?Resp:  Normal effort  ?MSK:   Moves extremities without difficulty  ?Other:  Patient left knee markedly erythematous, hot to touch, swollen. ? ?Medical Decision Making  ?Medically screening exam initiated at 1:31 PM.  Appropriate orders placed.  Glenn [name] was informed that the remainder of the evaluation will be completed by another provider, this initial triage assessment does not replace that evaluation, and the importance of remaining in the ED until their evaluation is complete. ? ? ?  ?Al Decant, PA-C ?12/15/21 1331 ? ?

## 2021-12-15 NOTE — ED Triage Notes (Signed)
Pt reports left knee pain that started after he was hospitalized last week. Reports some swelling. Denies any injury/trauma ?

## 2022-01-06 ENCOUNTER — Institutional Professional Consult (permissible substitution): Payer: No Typology Code available for payment source | Admitting: Cardiology

## 2022-01-17 ENCOUNTER — Encounter (HOSPITAL_COMMUNITY): Payer: Self-pay | Admitting: Emergency Medicine

## 2022-01-17 ENCOUNTER — Other Ambulatory Visit: Payer: Self-pay

## 2022-01-17 ENCOUNTER — Emergency Department (HOSPITAL_COMMUNITY)
Admission: EM | Admit: 2022-01-17 | Discharge: 2022-01-17 | Disposition: A | Discharge status: Home / Self Care | Payer: No Typology Code available for payment source | Attending: Emergency Medicine | Admitting: Emergency Medicine

## 2022-01-17 ENCOUNTER — Emergency Department (HOSPITAL_COMMUNITY): Payer: No Typology Code available for payment source

## 2022-01-17 DIAGNOSIS — Z7901 Long term (current) use of anticoagulants: Secondary | ICD-10-CM | POA: Diagnosis not present

## 2022-01-17 DIAGNOSIS — Z79899 Other long term (current) drug therapy: Secondary | ICD-10-CM | POA: Insufficient documentation

## 2022-01-17 DIAGNOSIS — W1840XA Slipping, tripping and stumbling without falling, unspecified, initial encounter: Secondary | ICD-10-CM | POA: Diagnosis not present

## 2022-01-17 DIAGNOSIS — I509 Heart failure, unspecified: Secondary | ICD-10-CM | POA: Diagnosis not present

## 2022-01-17 DIAGNOSIS — S93401A Sprain of unspecified ligament of right ankle, initial encounter: Secondary | ICD-10-CM | POA: Diagnosis not present

## 2022-01-17 DIAGNOSIS — M109 Gout, unspecified: Secondary | ICD-10-CM | POA: Insufficient documentation

## 2022-01-17 DIAGNOSIS — I4891 Unspecified atrial fibrillation: Secondary | ICD-10-CM | POA: Diagnosis not present

## 2022-01-17 DIAGNOSIS — I110 Hypertensive heart disease with heart failure: Secondary | ICD-10-CM | POA: Diagnosis not present

## 2022-01-17 DIAGNOSIS — S99911A Unspecified injury of right ankle, initial encounter: Secondary | ICD-10-CM | POA: Diagnosis present

## 2022-01-17 MED ORDER — PREDNISONE 10 MG PO TABS: ORAL_TABLET | ORAL | 0 refills | Status: AC

## 2022-01-17 NOTE — Discharge Instructions (Addendum)
Your x-rays of your foot and ankle did not show any fractures or other injuries. You likely have a sprain in the ankle from twisting it a few days ago.  You also have a gout flare in your right toe. I have prescribed you a prednisone taper, please take as directed. Please follow-up with your primary care doctor within the next week.

## 2022-01-17 NOTE — ED Provider Notes (Signed)
[facility] EMERGENCY DEPARTMENT Provider Note   CSN: [id number] Arrival date & time: 01/17/22  1514     History  Chief Complaint  Patient presents with   Foot Pain    Glenn [name] is a 56 y.o. male with a history of atrial fibrillation on Eliquis, HTN, CHF, and gout presenting to the ED with right toe and ankle pain.  Patient states that about 4 days ago he was walking in excellently twisted his right ankle.  He has been able to ambulate on it since, but he has noted progressive swelling over the past few days that acutely worsened yesterday.  Yesterday he also noted some redness and pain over his right great toe.  He felt this was likely a gout flare as this is where he typically has his gout flares and it feels very similar.  He did take colchicine with some mild improvement.  He is mostly concerned because of the swelling over his foot and ankle and pain with ambulation.  Most of the pain is on the lateral aspect of the ankle and in the right great toe.  No numbness/tingling.  No fevers.   Foot Pain     Home Medications Prior to Admission medications   Medication Sig Start Date End Date Taking? Authorizing Provider  predniSONE (DELTASONE) 10 MG tablet Take 5 tablets (50 mg total) by mouth daily with breakfast for 2 days, THEN 4 tablets (40 mg total) daily with breakfast for 2 days, THEN 3 tablets (30 mg total) daily with breakfast for 2 days, THEN 2 tablets (20 mg total) daily with breakfast for 2 days, THEN 1 tablet (10 mg total) daily with breakfast for 2 days. 01/17/22 01/27/22 Yes Laurence Compton, MD  amiodarone (PACERONE) 200 MG tablet Take 2 tablets (400 mg) twice a day through 04/29, then 1 tablet (200 mg) twice a day through 05/06, then 1 tablet daily 12/09/21   Andrey Farmer, PA-C  apixaban (ELIQUIS) 5 MG TABS tablet Take 5 mg by mouth 2 (two) times daily.    [provider]  atorvastatin (LIPITOR) 80 MG tablet Take 80 mg by mouth daily.     [provider]  carvedilol (COREG) 12.5 MG tablet Take 1 tablet (12.5 mg total) by mouth 2 (two) times daily with a meal. 12/09/21   Andrey Farmer, PA-C  cholecalciferol (VITAMIN D) 1000 UNITS tablet Take 1,000 Units by mouth daily.    [provider]  empagliflozin (JARDIANCE) 10 MG TABS tablet Take 1 tablet (10 mg total) by mouth daily. 12/09/21   Andrey Farmer, PA-C  hydrALAZINE (APRESOLINE) 25 MG tablet Take 3 tablets (75 mg total) by mouth every 8 (eight) hours. 12/09/21   Andrey Farmer, PA-C  isosorbide mononitrate (IMDUR) 30 MG 24 hr tablet Take 1 tablet (30 mg total) by mouth daily. 12/09/21   Andrey Farmer, PA-C  spironolactone (ALDACTONE) 25 MG tablet Take 1 tablet (25 mg total) by mouth daily. 12/09/21   Andrey Farmer, PA-C      Allergies    Ace inhibitors, Penicillins, and Thiazide-type diuretics    Review of Systems   Review of Systems  Constitutional:  Negative for fever.  Musculoskeletal:        Positive for right foot and ankle pain.   Physical Exam Updated Vital Signs BP 130/78 (BP Location: Right Arm)   Pulse 65   Temp 98.3 F (36.8 C) (Oral)   Resp 18   SpO2 98%  Physical Exam Constitutional:      Appearance: He is not toxic-appearing or diaphoretic.  HENT:     Head: Normocephalic and atraumatic.     Nose: Nose normal.  Cardiovascular:     Rate and Rhythm: Normal rate and regular rhythm.     Pulses: Normal pulses.  Pulmonary:     Effort: Pulmonary effort is normal. No respiratory distress.  Musculoskeletal:     Cervical back: Neck supple.     Comments: There is diffuse swelling over the right foot and right ankle.  No point tenderness over the medial or lateral malleolus.  Does have some mild tenderness over the right heel. Patient is able to range the right toe and ankle, only slightly limited due to pain.  Skin:    General: Skin is warm and dry.     Comments: Kara Mead and warmth over the right great toe.   Neurological:     General: No focal deficit present.     Mental Status: He is alert and oriented to person, place, and time.    ED Results / Procedures / Treatments   Labs (all labs ordered are listed, but only abnormal results are displayed) Labs Reviewed - No data to display  EKG None  Radiology DG Ankle Complete Right  Result Date: 01/17/2022 CLINICAL DATA:  right ankle pain and swelling EXAM: RIGHT ANKLE - COMPLETE 3+ VIEW COMPARISON:  X-ray right foot 01/17/2022 FINDINGS: There is no evidence of fracture, dislocation, or joint effusion. There is no evidence of arthropathy or other focal bone abnormality. Subcutaneus soft tissue edema. IMPRESSION: No acute displaced fracture or dislocation. Electronically Signed   By: Tish Frederickson M.D.   On: 01/17/2022 17:34   DG Foot Complete Right  Result Date: 01/17/2022 CLINICAL DATA:  Foot pain after a fall. Redness and swelling. Possible gout. EXAM: RIGHT FOOT COMPLETE - 3+ VIEW COMPARISON:  None available FINDINGS: Mild hallux valgus deformity with secondary osteoarthritis of the first metatarsophalangeal joint. There is also degenerative change of the interphalangeal joint of the great toe. No acute fracture or dislocation. Suspect dorsal soft tissue swelling about the forefoot. Tiny Achilles and calcaneal spurs. IMPRESSION: Degenerative change and soft tissue swelling. Electronically Signed   By: Jeronimo Greaves M.D.   On: 01/17/2022 16:19    Procedures Procedures    Medications Ordered in ED Medications - No data to display  ED Course/ Medical Decision Making/ A&P                           Medical Decision Making Amount and/or Complexity of Data Reviewed Radiology: ordered.  Risk Prescription drug management.   Glenn [name] is a 56 y.o. male with a history of atrial fibrillation on Eliquis, HTN, CHF, and gout presenting to the ED with right toe and ankle pain.  On exam, the patient is afebrile and hemodynamically stable.  He  does have diffuse swelling over the right foot and right ankle.  He does have some tenderness, swelling, and warmth over the right great toe.  He does not have point tenderness over the lateral or medial malleolus.  X-ray of the right foot was obtained which showed some degenerative changes and soft tissue swelling but otherwise no acute abnormalities.  X-ray of the right ankle shows no acute displaced fractures or dislocations.  The changes over the right great toe appear to be most consistent with gout.  Patient states that this is typically where he  has his gout flares and that this feels very similar.  I have a low suspicion for septic joint given patient has not had any fevers and is able to range the toe without too significant pain.  Patient is on Eliquis so unable to treat with NSAIDs.  Will initiate prednisone taper x10 days.  Given his negative x-ray, his right ankle swelling and pain is likely due to a sprain/strain.  I did recommend regular elevation and ice over the next several days and Tylenol as needed for pain.  I also recommended that he follow-up with his PCP within the next week.  Strict return precautions were discussed and the patient was discharged home in stable condition.        Final Clinical Impression(s) / ED Diagnoses Final diagnoses:  Sprain of right ankle, unspecified ligament, initial encounter  Acute gout involving toe of right foot, unspecified cause    Rx / DC Orders ED Discharge Orders          Ordered    predniSONE (DELTASONE) 10 MG tablet        01/17/22 1754              Laurence ComptonFoster, Rodric Punch, MD 01/17/22 2127    Pricilla LovelessGoldston, Scott, MD 01/18/22 1553

## 2022-01-17 NOTE — ED Triage Notes (Signed)
C/o R foot pain, redness, and swelling.  States he tripped on stairs on Tuesday.  Also believes he has a gout flare-up in R great toe.

## 2022-03-12 ENCOUNTER — Encounter: Payer: Self-pay | Admitting: Cardiology

## 2022-03-12 ENCOUNTER — Ambulatory Visit (INDEPENDENT_AMBULATORY_CARE_PROVIDER_SITE_OTHER): Payer: No Typology Code available for payment source | Admitting: Cardiology

## 2022-03-12 VITALS — BP 132/98 | HR 72 | Ht 72.0 in | Wt 224.0 lb

## 2022-03-12 DIAGNOSIS — T82198S Other mechanical complication of other cardiac electronic device, sequela: Secondary | ICD-10-CM

## 2022-03-12 DIAGNOSIS — Z01818 Encounter for other preprocedural examination: Secondary | ICD-10-CM

## 2022-03-12 DIAGNOSIS — Z95810 Presence of automatic (implantable) cardiac defibrillator: Secondary | ICD-10-CM

## 2022-03-12 DIAGNOSIS — I480 Paroxysmal atrial fibrillation: Secondary | ICD-10-CM

## 2022-03-12 DIAGNOSIS — I5022 Chronic systolic (congestive) heart failure: Secondary | ICD-10-CM

## 2022-03-12 NOTE — Progress Notes (Signed)
Electrophysiology Office Note:    Date:  03/12/2022   ID:  Glenn [name], DOB 1966/07/27, MRN [id number]  PCP:  Patient, No Pcp Per  [facility] Cardiologist:  None  [facility] Electrophysiologist:  Lanier Prude, MD   Referring MD: Minda Meo, PA-C   Chief Complaint: New patient consult for Afib  History of Present Illness:    Glenn [name] is a 56 y.o. male who presents for an evaluation of atrial fibrillation at the request of Anna Genre, PA-C. Their medical history includes paroxysmal atrial fibrillation, HCM w/ ICD placement in 2013, HTN, ETOH abuse.  He was admitted to the hospital 12/04/21 after 2 weeks of worsening dyspnea. On arrival via EMS he was found to be in cardiogenic shock and emergently intubated in the ED due to acute respiratory failure. He developed Afib with RVR and underwent TEE/DCCV on 4/21 with conversion to sinus rhythm. Course further complicated by AKI on CKD. Once renal function improved, he underwent R/LHC on 04/24. Found to have 1 v CAD treated medically, normal filling pressures and normal CO. GDMT adjusted.  His ICD was at [facility] and had generator change 01/13/22.  Today, he reports that in the past he was generally unable to tell when he was out of rhythm. However, most recently he was symptomatic with dyspnea prompting his hospitalization 11/2021.  Generally he appears well. He remains compliant with Eliquis.  He also successfully lost about 28 lbs, which he attributes to his recent hospitalization. His weight has been stable around 225 lbs since then.  They deny any palpitations, chest pain, shortness of breath, or peripheral edema. No lightheadedness, headaches, syncope, orthopnea, or PND.  He is not taking his amiodarone right now.     Past Medical History:  Diagnosis Date   A-fib (HCC) 09/29/15   Cardiomyopathy (HCC)    Dysrhythmia    Enlarged prostate    Gout    Hypertension     Past Surgical History:  Procedure  Laterality Date   CARDIAC DEFIBRILLATOR PLACEMENT  2013   [employer]onic Evera dual chamber ICD implanted  by Dr Wendi Maya at [facility]   RIGHT/LEFT HEART CATH AND CORONARY ANGIOGRAPHY N/A 12/08/2021   Procedure: RIGHT/LEFT HEART CATH AND CORONARY ANGIOGRAPHY;  Surgeon: Laurey Morale, MD;  Location: [facility] INVASIVE CV LAB;  Service: Cardiovascular;  Laterality: N/A;    Current Medications: Current Meds  Medication Sig   apixaban (ELIQUIS) 5 MG TABS tablet Take 5 mg by mouth 2 (two) times daily.   atorvastatin (LIPITOR) 80 MG tablet Take 80 mg by mouth daily.   carvedilol (COREG) 12.5 MG tablet Take 1 tablet (12.5 mg total) by mouth 2 (two) times daily with a meal.   cholecalciferol (VITAMIN D) 1000 UNITS tablet Take 1,000 Units by mouth daily.   spironolactone (ALDACTONE) 25 MG tablet Take 1 tablet (25 mg total) by mouth daily.     Allergies:   Ace inhibitors, Penicillins, and Thiazide-type diuretics   Social History   Socioeconomic History   Marital status: Married    Spouse name: Not on file   Number of children: Not on file   Years of education: Not on file   Highest education level: Not on file  Occupational History   Not on file  Tobacco Use   Smoking status: Never   Smokeless tobacco: Never  Substance and Sexual Activity   Alcohol use: Yes    Comment: weekend use   Drug use: No   Sexual activity:  Yes  Other Topics Concern   Not on file  Social History Narrative   Not on file   Social Determinants of Health   Financial Resource Strain: Not on file  Food Insecurity: Not on file  Transportation Needs: Not on file  Physical Activity: Not on file  Stress: Not on file  Social Connections: Not on file     Family History: The patient's family history includes Diabetes in his mother; Hypertension in his father and mother.  ROS:   Please see the history of present illness.    All other systems reviewed and are negative.  EKGs/Labs/Other Studies Reviewed:    The  following studies were reviewed today:  03/12/2022  In clinic device interrogation personally reviewed: Battery longevity 12.2 years Lead parameters stable Burden of atrial fibrillation 0 since Jan 13, 2022 Atrial pacing 36% Ventricular pacing less than 0.1% No treated episodes of ventricular arrhythmia   12/08/2021  Right/Left Heart Cath:   Mid Cx lesion is 90% stenosed.   1. Normal filling pressures.  2. Normal cardiac output.  3. 90% stenosis in the mid LCx after take-off of a very large OM1.  The LCx is small and supplies minimal territory after OM1.  Not interventional target.    12/05/2021  Echo TEE (Bedside):    1. Left ventricular ejection fraction, by estimation, is 50%. The left  ventricle has mildly decreased function. The left ventricle demonstrates  regional wall motion abnormalities with mild anterolateral hypokinesis.  Moderate asymmetric basal septal left   ventricular hypertrophy.   2. Right ventricular systolic function is mildly reduced. The right  ventricular size is mildly enlarged.   3. Left atrial size was moderately dilated. No left atrial/left atrial  appendage thrombus was detected.   4. Peak RV-RA gradient 16 mmHg.   5. The aortic valve is tricuspid. Aortic valve regurgitation is not  visualized. No aortic stenosis is present.   6. The mitral valve is normal in structure. Mild mitral valve  regurgitation. No evidence of mitral stenosis.   7. No PFO or ASD by color doppler.   8. Normal caliber thoracic aorta with mild plaque.    EKG:   EKG is personally reviewed.     Recent Labs: 12/04/2021: B Natriuretic Peptide 142.3 12/06/2021: Magnesium 2.2 12/09/2021: ALT 43; TSH 1.401 12/15/2021: BUN 15; Creatinine, Ser 1.56; Hemoglobin 13.6; Platelets 193; Potassium 3.8; Sodium 138   Recent Lipid Panel    Component Value Date/Time   CHOL 100 12/06/2021 0424   TRIG 45 12/08/2021 0530   HDL 49 12/06/2021 0424   CHOLHDL 2.0 12/06/2021 0424   VLDL 15  12/06/2021 0424   LDLCALC 36 12/06/2021 0424    Physical Exam:    VS:  BP (!) 132/98   Pulse 72   Ht 6' (1.829 m)   Wt 224 lb (101.6 kg)   BMI 30.38 kg/m     Wt Readings from Last 3 Encounters:  03/12/22 224 lb (101.6 kg)  12/15/21 236 lb (107 kg)  12/09/21 228 lb 9.6 oz (103.7 kg)     GEN: Well nourished, well developed in no acute distress HEENT: Normal NECK: No JVD; No carotid bruits LYMPHATICS: No lymphadenopathy CARDIAC: RRR, no murmurs, rubs, gallops; Device pocket well healed. RESPIRATORY:  Clear to auscultation without rales, wheezing or rhonchi  ABDOMEN: Soft, non-tender, non-distended MUSCULOSKELETAL:  No edema; No deformity  SKIN: Warm and dry NEUROLOGIC:  Alert and oriented x 3 PSYCHIATRIC:  Normal affect  ASSESSMENT:    1. Paroxysmal atrial fibrillation (HCC)   2. Chronic systolic heart failure (HCC)   3. ICD (implantable cardioverter-defibrillator) in place   4. Inappropriate discharge of implantable cardioverter-defibrillator (ICD), sequela   5. Pre-op evaluation    PLAN:    In order of problems listed above:  #Paroxysmal atrial fibrillation Rhythm control indicated given he has presented with decompensated heart failure in the past and is highly symptomatic when out of rhythm.  He has been prescribed amiodarone in the past but is not taking it currently and would like to avoid long-term use given the potential for off target effects.  I discussed catheter ablation in detail with the patient and he wishes to proceed.  Discussed treatment options today for his AF including antiarrhythmic drug therapy and ablation. Discussed risks, recovery and likelihood of success. Discussed potential need for repeat ablation procedures and antiarrhythmic drugs after an initial ablation. They wish to proceed with scheduling.  Risk, benefits, and alternatives to EP study and radiofrequency ablation for afib were also discussed in detail today. These risks include  but are not limited to stroke, bleeding, vascular damage, tamponade, perforation, damage to the esophagus, lungs, and other structures, pulmonary vein stenosis, worsening renal function, and death. The patient understands these risk and wishes to proceed.  We will therefore proceed with catheter ablation at the next available time.  Carto, ICE, anesthesia are requested for the procedure.  Will also obtain CT PV protocol prior to the procedure to exclude LAA thrombus and further evaluate atrial anatomy.  #Chronic systolic heart failure NYHA class II today.  Relatively recent hospitalization for cardiogenic shock in the setting of atrial fibrillation with rapid ventricular rates.   Continue current medication regimen including Coreg.  We will have him discuss Jardiance, hydralazine and Imdur with his primary cardiologist.  Continue spironolactone. Rhythm control as above  #ICD in situ Device functioning appropriately.  [facility]   Total time spent with patient today 60 minutes. This includes reviewing records, evaluating the patient and coordinating care.  Medication Adjustments/Labs and Tests Ordered: Current medicines are reviewed at length with the patient today.  Concerns regarding medicines are outlined above.  Orders Placed This Encounter  Procedures   CT CARDIAC MORPH/PULM VEIN W/CM&W/O CA SCORE   CBC w/Diff   Basic Metabolic Panel (BMET)   No orders of the defined types were placed in this encounter.   I,Mathew Stumpf,acting as a Neurosurgeon for Lanier Prude, MD.,have documented all relevant documentation on the behalf of Lanier Prude, MD,as directed by  Lanier Prude, MD while in the presence of Lanier Prude, MD.  I, Lanier Prude, MD, have reviewed all documentation for this visit. The documentation on 03/12/22 for the exam, diagnosis, procedures, and orders are all accurate and complete.   Signed, Rossie Muskrat. Lalla Brothers, MD, [facility],  [facility] 03/12/2022 5:24 PM    Electrophysiology [facility]

## 2022-03-12 NOTE — Patient Instructions (Addendum)
Medication Instructions:  Your physician recommends that you continue on your current medications as directed. Please refer to the Current Medication list given to you today. *If you need a refill on your cardiac medications before your next appointment, please call your pharmacy*  Lab Work: None. If you have labs (blood work) drawn today and your tests are completely normal, you will receive your results only by: MyChart Message (if you have MyChart) OR A paper copy in the mail If you have any lab test that is abnormal or we need to change your treatment, we will call you to review the results.  Testing/Procedures: Your physician has requested that you have cardiac CT. Cardiac computed tomography (CT) is a painless test that uses an x-ray machine to take clear, detailed pictures of your heart. For further information please visit [email or web address] Please follow instruction sheet as given.  Your physician has recommended that you have an ablation. Catheter ablation is a medical procedure used to treat some cardiac arrhythmias (irregular heartbeats). During catheter ablation, a long, thin, flexible tube is put into a blood vessel in your groin (upper thigh), or neck. This tube is called an ablation catheter. It is then guided to your heart through the blood vessel. Radio frequency waves destroy small areas of heart tissue where abnormal heartbeats may cause an arrhythmia to start. Please see the instruction sheet given to you today.   Follow-Up: At [facility], you and your health needs are our priority.  As part of our continuing mission to provide you with exceptional heart care, we have created designated Provider Care Teams.  These Care Teams include your primary Cardiologist (physician) and Advanced Practice Providers (APPs -  Physician Assistants and Nurse Practitioners) who all work together to provide you with the care you need, when you need it.  Your physician wants you to  follow-up in: Ablation date is Nov 17 and lab date is Oct 23. For labs come to the chst office anytime from 8 am to 4 pm, no fasting. Then meet with Glenn [name] for your CT and Ablation instructions.   We recommend signing up for the patient portal called "MyChart".  Sign up information is provided on this After Visit Summary.  MyChart is used to connect with patients for Virtual Visits (Telemedicine).  Patients are able to view lab/test results, encounter notes, upcoming appointments, etc.  Non-urgent messages can be sent to your provider as well.   To learn more about what you can do with MyChart, go to [email or web address].    Any Other Special Instructions Will Be Listed Below (If Applicable).  Cardiac Ablation Cardiac ablation is a procedure to destroy (ablate) some heart tissue that is sending bad signals. These bad signals cause problems in heart rhythm. The heart has many areas that make these signals. If there are problems in these areas, they can make the heart beat in a way that is not normal. Destroying some tissues can help make the heart rhythm normal. Tell your doctor about: Any allergies you have. All medicines you are taking. These include vitamins, herbs, eye drops, creams, and over-the-counter medicines. Any problems you or family members have had with medicines that make you fall asleep (anesthetics). Any blood disorders you have. Any surgeries you have had. Any medical conditions you have, such as kidney failure. Whether you are pregnant or may be pregnant. What are the risks? This is a safe procedure. But problems may occur, including: Infection. Bruising and bleeding. Bleeding into the chest. Stroke or  blood clots. Damage to nearby areas of your body. Allergies to medicines or dyes. The need for a pacemaker if the normal system is damaged. Failure of the procedure to treat the problem. What happens before the procedure? Medicines Ask your doctor about: Changing or  stopping your normal medicines. This is important. Taking aspirin and ibuprofen. Do not take these medicines unless your doctor tells you to take them. Taking other medicines, vitamins, herbs, and supplements. General instructions Follow instructions from your doctor about what you cannot eat or drink. Plan to have someone take you home from the hospital or clinic. If you will be going home right after the procedure, plan to have someone with you for 24 hours. Ask your doctor what steps will be taken to prevent infection. What happens during the procedure?  An IV tube will be put into one of your veins. You will be given a medicine to help you relax. The skin on your neck or groin will be numbed. A cut (incision) will be made in your neck or groin. A needle will be put through your cut and into a large vein. A tube (catheter) will be put into the needle. The tube will be moved to your heart. Dye may be put through the tube. This helps your doctor see your heart. Small devices (electrodes) on the tube will send out signals. A type of energy will be used to destroy some heart tissue. The tube will be taken out. Pressure will be held on your cut. This helps stop bleeding. A bandage will be put over your cut. The exact procedure may vary among doctors and hospitals. What happens after the procedure? You will be watched until you leave the hospital or clinic. This includes checking your heart rate, breathing rate, oxygen, and blood pressure. Your cut will be watched for bleeding. You will need to lie still for a few hours. Do not drive for 24 hours or as long as your doctor tells you. Summary Cardiac ablation is a procedure to destroy some heart tissue. This is done to treat heart rhythm problems. Tell your doctor about any medical conditions you may have. Tell him or her about all medicines you are taking to treat them. This is a safe procedure. But problems may occur. These include  infection, bruising, bleeding, and damage to nearby areas of your body. Follow what your doctor tells you about food and drink. You may also be told to change or stop some of your medicines. After the procedure, do not drive for 24 hours or as long as your doctor tells you. This information is not intended to replace advice given to you by your health care provider. Make sure you discuss any questions you have with your health care provider. Document Revised: 07/06/2019 Document Reviewed: 07/06/2019 Elsevier Patient Education  2023 [employer].

## 2022-03-13 ENCOUNTER — Telehealth: Payer: Self-pay

## 2022-03-13 NOTE — Telephone Encounter (Signed)
Patient is in paceart and I have requested patient in carelink. Will schedule remotes when he is in our system

## 2022-06-08 ENCOUNTER — Ambulatory Visit: Payer: No Typology Code available for payment source | Attending: Cardiology

## 2022-06-08 DIAGNOSIS — Z95810 Presence of automatic (implantable) cardiac defibrillator: Secondary | ICD-10-CM

## 2022-06-08 DIAGNOSIS — T82198S Other mechanical complication of other cardiac electronic device, sequela: Secondary | ICD-10-CM

## 2022-06-08 DIAGNOSIS — I5022 Chronic systolic (congestive) heart failure: Secondary | ICD-10-CM

## 2022-06-08 DIAGNOSIS — Z01818 Encounter for other preprocedural examination: Secondary | ICD-10-CM

## 2022-06-08 DIAGNOSIS — I480 Paroxysmal atrial fibrillation: Secondary | ICD-10-CM

## 2022-06-09 LAB — BASIC METABOLIC PANEL
BUN/Creatinine Ratio: 11 (ref 9–20)
BUN: 14 mg/dL (ref 6–24)
CO2: 21 mmol/L (ref 20–29)
Calcium: 10 mg/dL (ref 8.7–10.2)
Chloride: 110 mmol/L — ABNORMAL HIGH (ref 96–106)
Creatinine, Ser: 1.28 mg/dL — ABNORMAL HIGH (ref 0.76–1.27)
Glucose: 87 mg/dL (ref 70–99)
Potassium: 4.3 mmol/L (ref 3.5–5.2)
Sodium: 143 mmol/L (ref 134–144)
eGFR: 65 mL/min/{1.73_m2} (ref >59)

## 2022-06-09 LAB — CBC WITH DIFFERENTIAL/PLATELET
Basophils Absolute: 0 10*3/uL (ref 0.0–0.2)
Basos: 0 %
EOS (ABSOLUTE): 0.1 10*3/uL (ref 0.0–0.4)
Eos: 2 %
Hematocrit: 42.4 % (ref 37.5–51.0)
Hemoglobin: 14.5 g/dL (ref 13.0–17.7)
Immature Grans (Abs): 0 10*3/uL (ref 0.0–0.1)
Immature Granulocytes: 0 %
Lymphocytes Absolute: 1.7 10*3/uL (ref 0.7–3.1)
Lymphs: 23 %
MCH: 30.9 pg (ref 26.6–33.0)
MCHC: 34.2 g/dL (ref 31.5–35.7)
MCV: 90 fL (ref 79–97)
Monocytes Absolute: 0.7 10*3/uL (ref 0.1–0.9)
Monocytes: 9 %
Neutrophils Absolute: 5 10*3/uL (ref 1.4–7.0)
Neutrophils: 66 %
Platelets: 202 10*3/uL (ref 150–450)
RBC: 4.69 x10E6/uL (ref 4.14–5.80)
RDW: 11.9 % (ref 11.6–15.4)
WBC: 7.5 10*3/uL (ref 3.4–10.8)

## 2022-06-12 ENCOUNTER — Telehealth: Payer: Self-pay | Admitting: *Deleted

## 2022-06-12 NOTE — Telephone Encounter (Signed)
Unable to leave voicemail, box is full.   Patient did not get CT and ablation instructions on his lab day.

## 2022-06-12 NOTE — Telephone Encounter (Signed)
Patient requested that I mail his instructions to his home address. Confirmed address.  Placed in office out going mail.

## 2022-06-12 NOTE — Telephone Encounter (Signed)
Patient returned call

## 2022-06-24 ENCOUNTER — Telehealth (HOSPITAL_COMMUNITY): Payer: Self-pay | Admitting: *Deleted

## 2022-06-24 NOTE — Telephone Encounter (Signed)
Reaching out to patient to offer assistance regarding upcoming cardiac imaging study; pt verbalizes understanding of appt date/time, parking situation and where to check in, and verified current allergies; name and call back number provided for further questions should they arise  Dawsyn Ramsaran RN Navigator Cardiac Imaging [facility] [phone] office [phone] cell  

## 2022-06-25 ENCOUNTER — Ambulatory Visit (HOSPITAL_BASED_OUTPATIENT_CLINIC_OR_DEPARTMENT_OTHER)
Admission: RE | Admit: 2022-06-25 | Discharge: 2022-06-25 | Disposition: A | Discharge status: Home / Self Care | Payer: No Typology Code available for payment source | Source: Ambulatory Visit | Attending: Cardiology | Admitting: Cardiology

## 2022-06-25 DIAGNOSIS — Z95810 Presence of automatic (implantable) cardiac defibrillator: Secondary | ICD-10-CM | POA: Diagnosis present

## 2022-06-25 DIAGNOSIS — T82198S Other mechanical complication of other cardiac electronic device, sequela: Secondary | ICD-10-CM | POA: Insufficient documentation

## 2022-06-25 DIAGNOSIS — I5022 Chronic systolic (congestive) heart failure: Secondary | ICD-10-CM | POA: Diagnosis not present

## 2022-06-25 DIAGNOSIS — I480 Paroxysmal atrial fibrillation: Secondary | ICD-10-CM

## 2022-06-25 DIAGNOSIS — Z01818 Encounter for other preprocedural examination: Secondary | ICD-10-CM | POA: Diagnosis present

## 2022-06-25 MED ORDER — IOHEXOL 350 MG/ML SOLN
100.0000 mL | Freq: Once | INTRAVENOUS | Status: AC | PRN
  Administered 2022-06-25: 80 mL via INTRAVENOUS

## 2022-07-02 NOTE — Pre-Procedure Instructions (Signed)
Instructed patient on the following items: Arrival time 0830 Nothing to eat or drink after midnight No meds AM of procedure Responsible person to drive you home and stay with you for 24 hrs  Have you missed any doses of anti-coagulant Eliquis- hasn't missed any doses   

## 2022-07-03 ENCOUNTER — Ambulatory Visit (HOSPITAL_COMMUNITY): Payer: No Typology Code available for payment source | Admitting: Anesthesiology

## 2022-07-03 ENCOUNTER — Encounter (HOSPITAL_COMMUNITY)
Admission: RE | Disposition: A | Discharge status: Home / Self Care | Payer: Self-pay | Source: Home / Self Care | Attending: Cardiology

## 2022-07-03 ENCOUNTER — Other Ambulatory Visit: Payer: Self-pay

## 2022-07-03 ENCOUNTER — Telehealth: Payer: Self-pay

## 2022-07-03 ENCOUNTER — Ambulatory Visit (HOSPITAL_COMMUNITY)
Admission: RE | Admit: 2022-07-03 | Discharge: 2022-07-03 | Disposition: A | Discharge status: Home / Self Care | Payer: No Typology Code available for payment source | Attending: Cardiology | Admitting: Cardiology

## 2022-07-03 DIAGNOSIS — I480 Paroxysmal atrial fibrillation: Secondary | ICD-10-CM

## 2022-07-03 DIAGNOSIS — Z539 Procedure and treatment not carried out, unspecified reason: Secondary | ICD-10-CM | POA: Insufficient documentation

## 2022-07-03 SURGERY — ATRIAL FIBRILLATION ABLATION
Anesthesia: General

## 2022-07-03 MED ORDER — SODIUM CHLORIDE 0.9 % IV SOLN: INTRAVENOUS | Status: DC

## 2022-07-03 NOTE — Anesthesia Preprocedure Evaluation (Deleted)
Anesthesia Evaluation  Patient identified by MRN, date of birth, ID band Patient awake    Reviewed: Allergy & Precautions, NPO status , Patient's Chart, lab work & pertinent test results, reviewed documented beta blocker date and time   Airway Mallampati: III  TM Distance: >3 FB Neck ROM: Full    Dental no notable dental hx. (+) Teeth Intact, Dental Advisory Given   Pulmonary neg pulmonary ROS   Pulmonary exam normal breath sounds clear to auscultation       Cardiovascular hypertension, Pt. on medications and Pt. on home beta blockers +CHF  Normal cardiovascular exam+ dysrhythmias + Cardiac Defibrillator + Valvular Problems/Murmurs MR  Rhythm:Regular Rate:Normal  LHC 11/2021   Mid Cx lesion is 90% stenosed. 1. Normal filling pressures.  2. Normal cardiac output.  3. 90% stenosis in the mid LCx after take-off of a very large OM1.  The LCx is small and supplies minimal territory after OM1.  Not interventional target.    TEE 11/2021  1. Left ventricular ejection fraction, by estimation, is 50%. The left ventricle has mildly decreased function. The left ventricle demonstrates regional wall motion abnormalities with mild anterolateral hypokinesis. Moderate asymmetric basal septal left  ventricular hypertrophy.   2. Right ventricular systolic function is mildly reduced. The right ventricular size is mildly enlarged.   3. Left atrial size was moderately dilated. No left atrial/left atrial appendage thrombus was detected.   4. Peak RV-RA gradient 16 mmHg.   5. The aortic valve is tricuspid. Aortic valve regurgitation is not visualized. No aortic stenosis is present.   6. The mitral valve is normal in structure. Mild mitral valve regurgitation. No evidence of mitral stenosis.   7. No PFO or ASD by color doppler.   8. Normal caliber thoracic aorta with mild plaque.    TTE 11/2021  1. Left ventricular ejection fraction, by estimation, is 45  to 50%. The left ventricle has mildly decreased function. The left ventricle demonstrates regional wall motion abnormalities with basal to mid anterolateral hypokinesis. moderate asymmetric septal left ventricular hypertrophy. No LV outflow tract gradient or mitral valve systolic anterior motion. Left ventricular diastolic parameters are indeterminate.   2. Right ventricular systolic function is mildly reduced. The right ventricular size is mildly enlarged. There is mildly elevated pulmonary artery systolic pressure. The estimated right ventricular systolic pressure is 37.1 mmHg.   3. Left atrial size was mildly dilated.   4. The mitral valve is abnormal. Severe mitral valve regurgitation with restriction of the posterior leaflet and inadequate coaptation. ERO 0.47 cm^2 by PISA. Possible infarct-related MR. No evidence of mitral stenosis.   5. There is mild calcification of the aortic valve. Aortic valve regurgitation is not visualized. No aortic stenosis is present.   6. The inferior vena cava is dilated in size with <50% respiratory  variability, suggesting right atrial pressure of 15 mmHg.      Neuro/Psych negative neurological ROS     GI/Hepatic negative GI ROS, Neg liver ROS,,,  Endo/Other  negative endocrine ROS    Renal/GU Renal disease     Musculoskeletal negative musculoskeletal ROS (+)    Abdominal   Peds  Hematology negative hematology ROS (+)   Anesthesia Other Findings   Reproductive/Obstetrics                             Anesthesia Physical Anesthesia Plan  ASA: 3  Anesthesia Plan: General   Post-op Pain Management: Minimal or no  pain anticipated   Induction: Intravenous  PONV Risk Score and Plan: 2 and Ondansetron, Dexamethasone and Treatment may vary due to age or medical condition  Airway Management Planned: Oral ETT  Additional Equipment: None  Intra-op Plan:   Post-operative Plan: Extubation in OR  Informed Consent: I  have reviewed the patients History and Physical, chart, labs and discussed the procedure including the risks, benefits and alternatives for the proposed anesthesia with the patient or authorized representative who has indicated his/her understanding and acceptance.     Dental advisory given  Plan Discussed with: CRNA  Anesthesia Plan Comments: (Some nasal stuffiness and cough. Symptoms started yesterday. Denies fever or myalgias or sick contacts. Explained that the procedure may be cancelled)        Anesthesia Quick Evaluation

## 2022-07-03 NOTE — Telephone Encounter (Signed)
LM for pt to call back to reschedule his ablation procedure. He had to reschedule due to being sick.   Ok per Dr. Lalla Brothers to put him on as a 3rd fib case in January. Will need updated labs.

## 2022-07-17 NOTE — Addendum Note (Signed)
Addended by: Cleda Mccreedy on: 07/17/2022 02:09 PM   Modules accepted: Orders

## 2022-07-17 NOTE — Telephone Encounter (Signed)
Ablation work up complete.Marland Kitchen.(rescheduled from 11/17)  Pt is aware of date/time and I will mail his Instruction letter to him. He will call back with any questions or concerns once he receives the letter.

## 2022-07-31 ENCOUNTER — Ambulatory Visit (HOSPITAL_COMMUNITY): Payer: No Typology Code available for payment source | Admitting: Physician Assistant

## 2022-08-14 ENCOUNTER — Ambulatory Visit: Payer: No Typology Code available for payment source | Attending: Cardiology

## 2022-08-14 DIAGNOSIS — I480 Paroxysmal atrial fibrillation: Secondary | ICD-10-CM

## 2022-08-15 LAB — BASIC METABOLIC PANEL
BUN/Creatinine Ratio: 11 (ref 9–20)
BUN: 16 mg/dL (ref 6–24)
CO2: 23 mmol/L (ref 20–29)
Calcium: 9.3 mg/dL (ref 8.7–10.2)
Chloride: 100 mmol/L (ref 96–106)
Creatinine, Ser: 1.41 mg/dL — ABNORMAL HIGH (ref 0.76–1.27)
Glucose: 102 mg/dL — ABNORMAL HIGH (ref 70–99)
Potassium: 3.6 mmol/L (ref 3.5–5.2)
Sodium: 136 mmol/L (ref 134–144)
eGFR: 58 mL/min/{1.73_m2} — ABNORMAL LOW (ref >59)

## 2022-08-15 LAB — CBC
Hematocrit: 43.1 % (ref 37.5–51.0)
Hemoglobin: 15 g/dL (ref 13.0–17.7)
MCH: 31.3 pg (ref 26.6–33.0)
MCHC: 34.8 g/dL (ref 31.5–35.7)
MCV: 90 fL (ref 79–97)
Platelets: 224 10*3/uL (ref 150–450)
RBC: 4.79 x10E6/uL (ref 4.14–5.80)
RDW: 12.1 % (ref 11.6–15.4)
WBC: 8.2 10*3/uL (ref 3.4–10.8)

## 2022-08-31 NOTE — Pre-Procedure Instructions (Signed)
Attempted to call patient regarding procedure instructions for tomorrow.  Left voicemail on the following items: Arrival time 0830 Nothing to eat or drink after midnight No meds AM of procedure Responsible person to drive you home and stay with you for 24 hrs  Have you missed any doses of anti-coagulant Eliquis- if you have misses any doses please let office know right away.  You will not take any medication in the morning.

## 2022-09-01 ENCOUNTER — Encounter (HOSPITAL_COMMUNITY)
Admission: RE | Disposition: A | Discharge status: Home / Self Care | Payer: Self-pay | Source: Home / Self Care | Attending: Cardiology

## 2022-09-01 ENCOUNTER — Other Ambulatory Visit: Payer: Self-pay

## 2022-09-01 ENCOUNTER — Ambulatory Visit (HOSPITAL_COMMUNITY)
Admission: RE | Admit: 2022-09-01 | Discharge: 2022-09-01 | Disposition: A | Discharge status: Home / Self Care | Payer: No Typology Code available for payment source | Attending: Cardiology | Admitting: Cardiology

## 2022-09-01 ENCOUNTER — Ambulatory Visit (HOSPITAL_BASED_OUTPATIENT_CLINIC_OR_DEPARTMENT_OTHER): Payer: No Typology Code available for payment source | Admitting: Anesthesiology

## 2022-09-01 ENCOUNTER — Encounter (HOSPITAL_COMMUNITY): Payer: Self-pay | Admitting: Cardiology

## 2022-09-01 ENCOUNTER — Ambulatory Visit (HOSPITAL_COMMUNITY): Payer: No Typology Code available for payment source | Admitting: Anesthesiology

## 2022-09-01 ENCOUNTER — Other Ambulatory Visit (HOSPITAL_COMMUNITY): Payer: Self-pay

## 2022-09-01 DIAGNOSIS — Z79899 Other long term (current) drug therapy: Secondary | ICD-10-CM | POA: Diagnosis not present

## 2022-09-01 DIAGNOSIS — I4891 Unspecified atrial fibrillation: Secondary | ICD-10-CM | POA: Diagnosis not present

## 2022-09-01 DIAGNOSIS — Z95810 Presence of automatic (implantable) cardiac defibrillator: Secondary | ICD-10-CM | POA: Diagnosis not present

## 2022-09-01 DIAGNOSIS — I480 Paroxysmal atrial fibrillation: Secondary | ICD-10-CM | POA: Diagnosis not present

## 2022-09-01 DIAGNOSIS — I110 Hypertensive heart disease with heart failure: Secondary | ICD-10-CM

## 2022-09-01 DIAGNOSIS — I5022 Chronic systolic (congestive) heart failure: Secondary | ICD-10-CM | POA: Insufficient documentation

## 2022-09-01 DIAGNOSIS — I422 Other hypertrophic cardiomyopathy: Secondary | ICD-10-CM | POA: Insufficient documentation

## 2022-09-01 DIAGNOSIS — I509 Heart failure, unspecified: Secondary | ICD-10-CM | POA: Diagnosis not present

## 2022-09-01 DIAGNOSIS — I2510 Atherosclerotic heart disease of native coronary artery without angina pectoris: Secondary | ICD-10-CM | POA: Diagnosis not present

## 2022-09-01 HISTORY — PX: ATRIAL FIBRILLATION ABLATION: EP1191

## 2022-09-01 LAB — POCT ACTIVATED CLOTTING TIME
Activated Clotting Time: 342 seconds
Activated Clotting Time: 347 seconds

## 2022-09-01 SURGERY — ATRIAL FIBRILLATION ABLATION
Anesthesia: General

## 2022-09-01 MED ORDER — CEFAZOLIN SODIUM-DEXTROSE 2-4 GM/100ML-% IV SOLN
INTRAVENOUS | Status: AC
  Filled 2022-09-01: qty 100

## 2022-09-01 MED ORDER — LIDOCAINE 2% (20 MG/ML) 5 ML SYRINGE
INTRAMUSCULAR | Status: DC | PRN
  Administered 2022-09-01: 20 mg via INTRAVENOUS

## 2022-09-01 MED ORDER — PROPOFOL 10 MG/ML IV BOLUS
INTRAVENOUS | Status: DC | PRN
  Administered 2022-09-01: 50 mg via INTRAVENOUS
  Administered 2022-09-01: 150 mg via INTRAVENOUS

## 2022-09-01 MED ORDER — FENTANYL CITRATE (PF) 100 MCG/2ML IJ SOLN
INTRAMUSCULAR | Status: DC | PRN
  Administered 2022-09-01 (×2): 50 ug via INTRAVENOUS

## 2022-09-01 MED ORDER — SODIUM CHLORIDE 0.9% FLUSH: 3.0000 mL | INTRAVENOUS | Status: DC | PRN

## 2022-09-01 MED ORDER — PANTOPRAZOLE SODIUM 40 MG PO TBEC
40.0000 mg | DELAYED_RELEASE_TABLET | Freq: Every day | ORAL | Status: DC
  Administered 2022-09-01: 40 mg via ORAL
  Filled 2022-09-01: qty 1

## 2022-09-01 MED ORDER — FENTANYL CITRATE (PF) 100 MCG/2ML IJ SOLN: 25.0000 ug | INTRAMUSCULAR | Status: DC | PRN

## 2022-09-01 MED ORDER — APIXABAN 5 MG PO TABS
5.0000 mg | ORAL_TABLET | Freq: Two times a day (BID) | ORAL | Status: DC
  Administered 2022-09-01: 5 mg via ORAL
  Filled 2022-09-01: qty 1

## 2022-09-01 MED ORDER — ROCURONIUM BROMIDE 10 MG/ML (PF) SYRINGE
PREFILLED_SYRINGE | INTRAVENOUS | Status: DC | PRN
  Administered 2022-09-01: 50 mg via INTRAVENOUS

## 2022-09-01 MED ORDER — MIDAZOLAM HCL 2 MG/2ML IJ SOLN
INTRAMUSCULAR | Status: DC | PRN
  Administered 2022-09-01: 2 mg via INTRAVENOUS

## 2022-09-01 MED ORDER — DEXAMETHASONE SODIUM PHOSPHATE 10 MG/ML IJ SOLN
INTRAMUSCULAR | Status: DC | PRN
  Administered 2022-09-01: 4 mg via INTRAVENOUS

## 2022-09-01 MED ORDER — PROTAMINE SULFATE 10 MG/ML IV SOLN
INTRAVENOUS | Status: DC | PRN
  Administered 2022-09-01: 35 mg via INTRAVENOUS

## 2022-09-01 MED ORDER — COLCHICINE 0.6 MG PO TABS
0.6000 mg | ORAL_TABLET | Freq: Two times a day (BID) | ORAL | 0 refills | Status: DC
  Filled 2022-09-01: qty 10, 5d supply, fill #0

## 2022-09-01 MED ORDER — HEPARIN SODIUM (PORCINE) 1000 UNIT/ML IJ SOLN
INTRAMUSCULAR | Status: DC | PRN
  Administered 2022-09-01: 3000 [IU] via INTRAVENOUS
  Administered 2022-09-01: 17000 [IU] via INTRAVENOUS

## 2022-09-01 MED ORDER — PANTOPRAZOLE SODIUM 40 MG PO TBEC
40.0000 mg | DELAYED_RELEASE_TABLET | Freq: Every day | ORAL | 0 refills | Status: DC
  Filled 2022-09-01: qty 45, 45d supply, fill #0

## 2022-09-01 MED ORDER — SUGAMMADEX SODIUM 200 MG/2ML IV SOLN
INTRAVENOUS | Status: DC | PRN
  Administered 2022-09-01: 208.6 mg via INTRAVENOUS

## 2022-09-01 MED ORDER — SODIUM CHLORIDE 0.9% FLUSH: 3.0000 mL | Freq: Two times a day (BID) | INTRAVENOUS | Status: DC

## 2022-09-01 MED ORDER — ONDANSETRON HCL 4 MG/2ML IJ SOLN: 4.0000 mg | Freq: Four times a day (QID) | INTRAMUSCULAR | Status: DC | PRN

## 2022-09-01 MED ORDER — SODIUM CHLORIDE 0.9 % IV SOLN: INTRAVENOUS | Status: DC

## 2022-09-01 MED ORDER — ONDANSETRON HCL 4 MG/2ML IJ SOLN
INTRAMUSCULAR | Status: DC | PRN
  Administered 2022-09-01: 4 mg via INTRAVENOUS

## 2022-09-01 MED ORDER — SODIUM CHLORIDE 0.9 % IV SOLN: 250.0000 mL | INTRAVENOUS | Status: DC | PRN

## 2022-09-01 MED ORDER — COLCHICINE 0.6 MG PO TABS
0.6000 mg | ORAL_TABLET | Freq: Two times a day (BID) | ORAL | Status: DC
  Administered 2022-09-01: 0.6 mg via ORAL
  Filled 2022-09-01: qty 1

## 2022-09-01 MED ORDER — AMISULPRIDE (ANTIEMETIC) 5 MG/2ML IV SOLN: 10.0000 mg | Freq: Once | INTRAVENOUS | Status: DC | PRN

## 2022-09-01 MED ORDER — CEFAZOLIN SODIUM-DEXTROSE 2-3 GM-%(50ML) IV SOLR
INTRAVENOUS | Status: DC | PRN
  Administered 2022-09-01: 2 g via INTRAVENOUS

## 2022-09-01 MED ORDER — HEPARIN (PORCINE) IN NACL 1000-0.9 UT/500ML-% IV SOLN
INTRAVENOUS | Status: DC | PRN
  Administered 2022-09-01 (×2): 500 mL

## 2022-09-01 MED ORDER — HEPARIN SODIUM (PORCINE) 1000 UNIT/ML IJ SOLN
INTRAMUSCULAR | Status: DC | PRN
  Administered 2022-09-01: 1000 [IU] via INTRAVENOUS

## 2022-09-01 MED ORDER — ACETAMINOPHEN 325 MG PO TABS
650.0000 mg | ORAL_TABLET | ORAL | Status: DC | PRN
  Administered 2022-09-01: 650 mg via ORAL
  Filled 2022-09-01: qty 2

## 2022-09-01 SURGICAL SUPPLY — 19 items
BAG SNAP BAND KOVER 36X36 (MISCELLANEOUS) IMPLANT
CATH 8FR REPROCESSED SOUNDSTAR (CATHETERS) ×1 IMPLANT
CATH 8FR SOUNDSTAR REPROCESSED (CATHETERS) IMPLANT
CATH ABLAT QDOT MICRO BI TC DF (CATHETERS) IMPLANT
CATH OCTARAY 2.0 F 3-3-3-3-3 (CATHETERS) IMPLANT
CATH S CIRCA THERM PROBE 10F (CATHETERS) IMPLANT
CATH WEB BI DIR CSDF CRV REPRO (CATHETERS) IMPLANT
CLOSURE PERCLOSE PROSTYLE (VASCULAR PRODUCTS) IMPLANT
COVER SWIFTLINK CONNECTOR (BAG) ×1 IMPLANT
PACK EP LATEX FREE (CUSTOM PROCEDURE TRAY) ×1
PACK EP LF (CUSTOM PROCEDURE TRAY) ×1 IMPLANT
PAD DEFIB RADIO PHYSIO CONN (PAD) ×1 IMPLANT
PATCH CARTO3 (PAD) IMPLANT
SHEATH BAYLIS TRANSSEPTAL 98CM (NEEDLE) IMPLANT
SHEATH CARTO VIZIGO SM CVD (SHEATH) IMPLANT
SHEATH PINNACLE 8F 10CM (SHEATH) IMPLANT
SHEATH PINNACLE 9F 10CM (SHEATH) IMPLANT
SHEATH PROBE COVER 6X72 (BAG) IMPLANT
TUBING SMART ABLATE COOLFLOW (TUBING) IMPLANT

## 2022-09-01 NOTE — Discharge Instructions (Signed)

## 2022-09-01 NOTE — Transfer of Care (Signed)
Immediate Anesthesia Transfer of Care Note  Patient: BRAYLIN XU  Procedure(s) Performed: ATRIAL FIBRILLATION ABLATION  Patient Location: Cath Lab  Anesthesia Type:General  Level of Consciousness: awake, alert , and oriented  Airway & Oxygen Therapy: Patient Spontanous Breathing and Patient connected to nasal cannula oxygen  Post-op Assessment: Report given to RN and Post -op Vital signs reviewed and stable  Post vital signs: Reviewed and stable  Last Vitals:  Vitals Value Taken Time  BP 141/98   Temp    Pulse 75 09/01/22 1214  Resp 15 09/01/22 1214  SpO2 98 % 09/01/22 1214  Vitals shown include unvalidated device data.  Last Pain:  Vitals:   09/01/22 0931  PainSc: 0-No pain         Complications: There were no known notable events for this encounter.

## 2022-09-01 NOTE — Anesthesia Preprocedure Evaluation (Signed)
Anesthesia Evaluation  Patient identified by MRN, date of birth, ID band Patient awake    Reviewed: Allergy & Precautions, NPO status , Patient's Chart, lab work & pertinent test results  Airway Mallampati: II  TM Distance: >3 FB Neck ROM: Full    Dental  (+) Dental Advisory Given   Pulmonary neg pulmonary ROS   breath sounds clear to auscultation       Cardiovascular hypertension, Pt. on medications and Pt. on home beta blockers + CAD and +CHF  + dysrhythmias Atrial Fibrillation  Rhythm:Regular Rate:Normal     Neuro/Psych negative neurological ROS     GI/Hepatic negative GI ROS, Neg liver ROS,,,  Endo/Other  negative endocrine ROS    Renal/GU Renal disease     Musculoskeletal   Abdominal   Peds  Hematology negative hematology ROS (+)   Anesthesia Other Findings   Reproductive/Obstetrics                             Anesthesia Physical Anesthesia Plan  ASA: 3  Anesthesia Plan: General   Post-op Pain Management: Tylenol PO (pre-op)*   Induction: Intravenous  PONV Risk Score and Plan: 2 and Dexamethasone, Ondansetron and Treatment may vary due to age or medical condition  Airway Management Planned: Oral ETT  Additional Equipment: None  Intra-op Plan:   Post-operative Plan: Extubation in OR  Informed Consent: I have reviewed the patients History and Physical, chart, labs and discussed the procedure including the risks, benefits and alternatives for the proposed anesthesia with the patient or authorized representative who has indicated his/her understanding and acceptance.     Dental advisory given  Plan Discussed with: CRNA  Anesthesia Plan Comments:        Anesthesia Quick Evaluation

## 2022-09-01 NOTE — Anesthesia Postprocedure Evaluation (Signed)
Anesthesia Post Note  Patient: Glenn [name]  Procedure(s) Performed: ATRIAL FIBRILLATION ABLATION     Patient location during evaluation: PACU Anesthesia Type: General Level of consciousness: awake and alert Pain management: pain level controlled Vital Signs Assessment: post-procedure vital signs reviewed and stable Respiratory status: spontaneous breathing, nonlabored ventilation, respiratory function stable and patient connected to nasal cannula oxygen Cardiovascular status: blood pressure returned to baseline and stable Postop Assessment: no apparent nausea or vomiting Anesthetic complications: no  There were no known notable events for this encounter.  Last Vitals:  Vitals:   09/01/22 1330 09/01/22 1400  BP: (!) 141/100 (!) 127/96  Pulse: 71 78  Resp: (!) 27 (!) 33  Temp:    SpO2: 96% 93%    Last Pain:  Vitals:   09/01/22 1307  TempSrc:   PainSc: 8                  Tiajuana Amass

## 2022-09-01 NOTE — H&P (Signed)
Electrophysiology Office Note:     Date:  09/01/2022    ID:  Glenn [name], DOB 1966-08-10, MRN [id number]   PCP:  Patient, No Pcp Per  [facility] Cardiologist:  None  [facility] Electrophysiologist:  Vickie Epley, MD    Referring MD: Andrez Grime, PA-C    Chief Complaint: New patient consult for Afib   History of Present Illness:     Glenn [name] is a 57 y.o. male who presents for an evaluation of atrial fibrillation at the request of Marlyce Huge, PA-C. Their medical history includes paroxysmal atrial fibrillation, HCM w/ ICD placement in 2013, HTN, ETOH abuse.   He was admitted to the hospital 12/04/21 after 2 weeks of worsening dyspnea. On arrival via EMS he was found to be in cardiogenic shock and emergently intubated in the ED due to acute respiratory failure. He developed Afib with RVR and underwent TEE/DCCV on 4/21 with conversion to sinus rhythm. Course further complicated by AKI on CKD. Once renal function improved, he underwent R/LHC on 04/24. Found to have 1 v CAD treated medically, normal filling pressures and normal CO. GDMT adjusted.   His ICD was at [facility] and had generator change 01/13/22.   Today, he reports that in the past he was generally unable to tell when he was out of rhythm. However, most recently he was symptomatic with dyspnea prompting his hospitalization 11/2021.   Generally he appears well. He remains compliant with Eliquis.   He also successfully lost about 28 lbs, which he attributes to his recent hospitalization. His weight has been stable around 225 lbs since then.   They deny any palpitations, chest pain, shortness of breath, or peripheral edema. No lightheadedness, headaches, syncope, orthopnea, or PND.   He presents for PVI today. Procedure reviewed.   Objective      Past Medical History:  Diagnosis Date   A-fib ([place]) 09/29/15   Cardiomyopathy ([place])     Dysrhythmia     Enlarged prostate     Gout     Hypertension              Past Surgical History:  Procedure Laterality Date   CARDIAC DEFIBRILLATOR PLACEMENT   2013    Medtronic Evera dual chamber ICD implanted  by Dr Coy Saunas at [facility]   RIGHT/LEFT [place] N/A 12/08/2021    Procedure: RIGHT/LEFT HEART CATH AND CORONARY ANGIOGRAPHY;  Surgeon: Larey Dresser, MD;  Location: [facility];  Service: Cardiovascular;  Laterality: N/A;      Current Medications: Active Medications      Current Meds  Medication Sig   apixaban (ELIQUIS) 5 MG TABS tablet Take 5 mg by mouth 2 (two) times daily.   atorvastatin (LIPITOR) 80 MG tablet Take 80 mg by mouth daily.   carvedilol (COREG) 12.5 MG tablet Take 1 tablet (12.5 mg total) by mouth 2 (two) times daily with a meal.   cholecalciferol (VITAMIN D) 1000 UNITS tablet Take 1,000 Units by mouth daily.   spironolactone (ALDACTONE) 25 MG tablet Take 1 tablet (25 mg total) by mouth daily.        Allergies:   Ace inhibitors, Penicillins, and Thiazide-type diuretics    Social History         Socioeconomic History   Marital status: Married      Spouse name: Not on file   Number of children: Not on file   Years of education: Not on file  Highest education level: Not on file  Occupational History   Not on file  Tobacco Use   Smoking status: Never   Smokeless tobacco: Never  Substance and Sexual Activity   Alcohol use: Yes      Comment: weekend use   Drug use: No   Sexual activity: Yes  Other Topics Concern   Not on file  Social History Narrative   Not on file    Social Determinants of Health    Financial Resource Strain: Not on file  Food Insecurity: Not on file  Transportation Needs: Not on file  Physical Activity: Not on file  Stress: Not on file  Social Connections: Not on file      Family History: The patient's family history includes Diabetes in his mother; Hypertension in his father and mother.   ROS:   Please see the history of present illness.    All other  systems reviewed and are negative.   EKGs/Labs/Other Studies Reviewed:     The following studies were reviewed today:   03/12/2022  In clinic device interrogation personally reviewed: Battery longevity 12.2 years Lead parameters stable Burden of atrial fibrillation 0 since Jan 13, 2022 Atrial pacing 36% Ventricular pacing less than 0.1% No treated episodes of ventricular arrhythmia     12/08/2021  Right/Left Heart Cath:   Mid Cx lesion is 90% stenosed.   1. Normal filling pressures.  2. Normal cardiac output.  3. 90% stenosis in the mid LCx after take-off of a very large OM1.  The LCx is small and supplies minimal territory after OM1.  Not interventional target.    12/05/2021  Echo TEE (Bedside):    1. Left ventricular ejection fraction, by estimation, is 50%. The left  ventricle has mildly decreased function. The left ventricle demonstrates  regional wall motion abnormalities with mild anterolateral hypokinesis.  Moderate asymmetric basal septal left   ventricular hypertrophy.   2. Right ventricular systolic function is mildly reduced. The right  ventricular size is mildly enlarged.   3. Left atrial size was moderately dilated. No left atrial/left atrial  appendage thrombus was detected.   4. Peak RV-RA gradient 16 mmHg.   5. The aortic valve is tricuspid. Aortic valve regurgitation is not  visualized. No aortic stenosis is present.   6. The mitral valve is normal in structure. Mild mitral valve  regurgitation. No evidence of mitral stenosis.   7. No PFO or ASD by color doppler.   8. Normal caliber thoracic aorta with mild plaque.      EKG:   EKG is personally reviewed.        Recent Labs: 12/04/2021: B Natriuretic Peptide 142.3 12/06/2021: Magnesium 2.2 12/09/2021: ALT 43; TSH 1.401 12/15/2021: BUN 15; Creatinine, Ser 1.56; Hemoglobin 13.6; Platelets 193; Potassium 3.8; Sodium 138    Recent Lipid Panel Labs (Brief)          Component Value Date/Time    CHOL 100  12/06/2021 0424    TRIG 45 12/08/2021 0530    HDL 49 12/06/2021 0424    CHOLHDL 2.0 12/06/2021 0424    VLDL 15 12/06/2021 0424    LDLCALC 36 12/06/2021 0424        Physical Exam:     VS:  BP 141/98   Pulse 57   Ht 6' (1.829 m)   Wt 224 lb (101.6 kg)   BMI 30.38 kg/m         Wt Readings from Last 3 Encounters:  03/12/22 224 lb (101.6  kg)  12/15/21 236 lb (107 kg)  12/09/21 228 lb 9.6 oz (103.7 kg)      GEN: Well nourished, well developed in no acute distress HEENT: Normal NECK: No JVD; No carotid bruits LYMPHATICS: No lymphadenopathy CARDIAC: RRR, no murmurs, rubs, gallops; Device pocket well healed. RESPIRATORY:  Clear to auscultation without rales, wheezing or rhonchi  ABDOMEN: Soft, non-tender, non-distended MUSCULOSKELETAL:  No edema; No deformity  SKIN: Warm and dry NEUROLOGIC:  Alert and oriented x 3 PSYCHIATRIC:  Normal affect          Assessment ASSESSMENT:     1. Paroxysmal atrial fibrillation (HCC)   2. Chronic systolic heart failure ([place])   3. ICD (implantable cardioverter-defibrillator) in place   4. Inappropriate discharge of implantable cardioverter-defibrillator (ICD), sequela   5. Pre-op evaluation     PLAN:     In order of problems listed above:   #Paroxysmal atrial fibrillation Rhythm control indicated given he has presented with decompensated heart failure in the past and is highly symptomatic when out of rhythm.  He has been prescribed amiodarone in the past but is not taking it currently and would like to avoid long-term use given the potential for off target effects.  I discussed catheter ablation in detail with the patient and he wishes to proceed.  Discussed treatment options today for his AF including antiarrhythmic drug therapy and ablation. Discussed risks, recovery and likelihood of success. Discussed potential need for repeat ablation procedures and antiarrhythmic drugs after an initial ablation. They wish to proceed with  scheduling.   Risk, benefits, and alternatives to EP study and radiofrequency ablation for afib were also discussed in detail today. These risks include but are not limited to stroke, bleeding, vascular damage, tamponade, perforation, damage to the esophagus, lungs, and other structures, pulmonary vein stenosis, worsening renal function, and death. The patient understands these risk and wishes to proceed.  We will therefore proceed with catheter ablation at the next available time.  Carto, ICE, anesthesia are requested for the procedure.  Will also obtain CT PV protocol prior to the procedure to exclude LAA thrombus and further evaluate atrial anatomy.   #Chronic systolic heart failure NYHA class II today.  Relatively recent hospitalization for cardiogenic shock in the setting of atrial fibrillation with rapid ventricular rates.   Continue current medication regimen including Coreg.  We will have him discuss Jardiance, hydralazine and Imdur with his primary cardiologist.  Continue spironolactone. Rhythm control as above   #ICD in situ Device functioning appropriately.  Follows remotely with the [place] clinic.     Presents for PVI. Procedure reviewed.   Signed, Hilton Cork. Quentin Ore, MD, [facility], [facility] 09/01/2022  Electrophysiology [facility]

## 2022-09-01 NOTE — Anesthesia Procedure Notes (Signed)
Procedure Name: Intubation Date/Time: 09/01/2022 10:13 AM  Performed by: Genelle Bal, CRNAPre-anesthesia Checklist: Patient identified, Emergency Drugs available, Suction available and Patient being monitored Patient Re-evaluated:Patient Re-evaluated prior to induction Oxygen Delivery Method: Circle system utilized Preoxygenation: Pre-oxygenation with 100% oxygen Induction Type: IV induction Ventilation: Mask ventilation without difficulty Laryngoscope Size: Miller and 2 Grade View: Grade I Tube type: Oral Tube size: 7.5 mm Number of attempts: 1 Airway Equipment and Method: Stylet Placement Confirmation: ETT inserted through vocal cords under direct vision, positive ETCO2 and breath sounds checked- equal and bilateral Secured at: 23 cm Tube secured with: Tape Dental Injury: Teeth and Oropharynx as per pre-operative assessment

## 2022-09-01 NOTE — Progress Notes (Signed)
Dr Quentin Ore aware pt did not take evening 09/01/23 dose of Eliquis.

## 2022-09-29 ENCOUNTER — Ambulatory Visit (HOSPITAL_COMMUNITY)
Admission: RE | Admit: 2022-09-29 | Discharge: 2022-09-29 | Disposition: A | Discharge status: Home / Self Care | Payer: No Typology Code available for payment source | Source: Ambulatory Visit | Attending: Cardiology | Admitting: Cardiology

## 2022-09-29 ENCOUNTER — Encounter (HOSPITAL_COMMUNITY): Payer: Self-pay | Admitting: Physician Assistant

## 2022-09-29 VITALS — BP 102/82 | HR 79 | Ht 72.0 in | Wt 238.4 lb

## 2022-09-29 DIAGNOSIS — D6869 Other thrombophilia: Secondary | ICD-10-CM | POA: Insufficient documentation

## 2022-09-29 DIAGNOSIS — Z95810 Presence of automatic (implantable) cardiac defibrillator: Secondary | ICD-10-CM | POA: Diagnosis not present

## 2022-09-29 DIAGNOSIS — Z79899 Other long term (current) drug therapy: Secondary | ICD-10-CM | POA: Insufficient documentation

## 2022-09-29 DIAGNOSIS — I480 Paroxysmal atrial fibrillation: Secondary | ICD-10-CM

## 2022-09-29 DIAGNOSIS — N189 Chronic kidney disease, unspecified: Secondary | ICD-10-CM | POA: Diagnosis not present

## 2022-09-29 DIAGNOSIS — I130 Hypertensive heart and chronic kidney disease with heart failure and stage 1 through stage 4 chronic kidney disease, or unspecified chronic kidney disease: Secondary | ICD-10-CM | POA: Diagnosis not present

## 2022-09-29 DIAGNOSIS — I422 Other hypertrophic cardiomyopathy: Secondary | ICD-10-CM | POA: Diagnosis not present

## 2022-09-29 DIAGNOSIS — I5022 Chronic systolic (congestive) heart failure: Secondary | ICD-10-CM | POA: Insufficient documentation

## 2022-09-29 DIAGNOSIS — Z7901 Long term (current) use of anticoagulants: Secondary | ICD-10-CM | POA: Insufficient documentation

## 2022-09-29 NOTE — Progress Notes (Signed)
Primary Care Physician: Patient, No Pcp Per Primary Cardiologist: none Primary Electrophysiologist: Dr Quentin Ore Referring Physician: Dr Tamsen Roers is a 57 y.o. male with a history of HCM s/p ICD, HTN, ETOH abuse, atrial fibrillation who presents for follow up in the [facility]. He was admitted to the hospital 12/04/21 after 2 weeks of worsening dyspnea. On arrival via EMS he was found to be in cardiogenic shock and emergently intubated in the ED due to acute respiratory failure. He developed Afib with RVR and underwent TEE/DCCV on 4/21 with conversion to sinus rhythm. Course further complicated by AKI on CKD. Once renal function improved, he underwent R/LHC on 04/24. Found to have 1 v CAD treated medically. His ICD was at [facility] and had generator change 01/13/22.  Patient is on Eliquis for a CHADS2VASC score of 2.   On follow up today, patient is s/p afib ablation with Dr Quentin Ore on 09/01/22. He reports that he has done well since the ablation. He remains in SR. He denies chest pain, swallowing pain, or groin issues. He is taking amiodarone currently.   Today, he denies symptoms of palpitations, chest pain, shortness of breath, orthopnea, PND, lower extremity edema, dizziness, presyncope, syncope, snoring, daytime somnolence, bleeding, or neurologic sequela. The patient is tolerating medications without difficulties and is otherwise without complaint today.    Atrial Fibrillation Risk Factors:  he does not have symptoms or diagnosis of sleep apnea. he does not have a history of rheumatic fever. he does have a history of alcohol use.   he has a BMI of Body mass index is 32.33 kg/m.Marland Kitchen Filed Weights   09/29/22 1137  Weight: 108.1 kg    Family History  Problem Relation Age of Onset   Diabetes Mother    Hypertension Mother    Hypertension Father      Atrial Fibrillation Management history:  Previous antiarrhythmic drugs: amiodarone  Previous  cardioversions: none Previous ablations: 09/01/22 CHADS2VASC score: 2 Anticoagulation history: Eliquis   Past Medical History:  Diagnosis Date   A-fib ([place]) 09/29/15   Cardiomyopathy ([place])    Dysrhythmia    Enlarged prostate    Gout    Hypertension    Past Surgical History:  Procedure Laterality Date   ATRIAL FIBRILLATION ABLATION N/A 09/01/2022   Procedure: ATRIAL FIBRILLATION ABLATION;  Surgeon: Vickie Epley, MD;  Location: [place] [facility];  Service: Cardiovascular;  Laterality: N/A;   CARDIAC DEFIBRILLATOR PLACEMENT  2013   Medtronic Evera dual chamber ICD implanted  by Dr Coy Saunas at [facility]   RIGHT/LEFT [place] N/A 12/08/2021   Procedure: RIGHT/LEFT HEART CATH AND CORONARY ANGIOGRAPHY;  Surgeon: Larey Dresser, MD;  Location: [place] [facility];  Service: Cardiovascular;  Laterality: N/A;    Current Outpatient Medications  Medication Sig Dispense Refill   acetaminophen (TYLENOL) 500 MG tablet Take 1,000 mg by mouth every 6 (six) hours as needed (pain.).     amiodarone (PACERONE) 200 MG tablet Take 2 tablets (400 mg) twice a day through 04/29, then 1 tablet (200 mg) twice a day through 05/06, then 1 tablet daily 50 tablet 1   apixaban (ELIQUIS) 5 MG TABS tablet Take 5 mg by mouth 2 (two) times daily.     atorvastatin (LIPITOR) 40 MG tablet Take 40 mg by mouth every evening.     carvedilol (COREG) 25 MG tablet Take 12.5 mg by mouth 2 (two) times daily with a meal.  Cholecalciferol (VITAMIN D) 50 MCG (2000 UT) tablet Take 2,000 Units by mouth in the morning.     diltiazem (CARDIZEM CD) 240 MG 24 hr capsule Take 240 mg by mouth in the morning.     empagliflozin (JARDIANCE) 10 MG TABS tablet Take 1 tablet (10 mg total) by mouth daily. 30 tablet 1   hydrALAZINE (APRESOLINE) 25 MG tablet Take 3 tablets (75 mg total) by mouth every 8 (eight) hours. 270 tablet 1   isosorbide mononitrate (IMDUR) 30 MG 24 hr tablet Take 1 tablet (30 mg total) by  mouth daily. 30 tablet 1   losartan (COZAAR) 100 MG tablet Take 50 mg by mouth every evening.     pantoprazole (PROTONIX) 40 MG tablet Take 1 tablet (40 mg total) by mouth daily. 45 tablet 0   spironolactone (ALDACTONE) 25 MG tablet Take 1 tablet (25 mg total) by mouth daily. 30 tablet 1   tamsulosin (FLOMAX) 0.4 MG CAPS capsule Take 0.4 mg by mouth every evening.     colchicine 0.6 MG tablet Take 1 tablet (0.6 mg total) by mouth 2 (two) times daily for 5 days. 10 tablet 0   No current facility-administered medications for this encounter.    Allergies  Allergen Reactions   Ace Inhibitors Swelling    Swelling lips   Penicillins Other (See Comments)    Childhood allergy   Thiazide-Type Diuretics Other (See Comments)    Unsure of reaction    Social History   Socioeconomic History   Marital status: Married    Spouse name: Not on file   Number of children: Not on file   Years of education: Not on file   Highest education level: Not on file  Occupational History   Not on file  Tobacco Use   Smoking status: Never   Smokeless tobacco: Never   Tobacco comments:    Never smoke 09/29/22  Substance and Sexual Activity   Alcohol use: Yes    Comment: weekend use   Drug use: No   Sexual activity: Yes  Other Topics Concern   Not on file  Social History Narrative   Not on file   Social Determinants of Health   Financial Resource Strain: Not on file  Food Insecurity: Not on file  Transportation Needs: Not on file  Physical Activity: Not on file  Stress: Not on file  Social Connections: Not on file  Intimate Partner Violence: Not on file     ROS- All systems are reviewed and negative except as per the HPI above.  Physical Exam: Vitals:   09/29/22 1137  BP: 102/82  Pulse: 79  Weight: 108.1 kg  Height: 6' (1.829 m)    GEN- The patient is a well appearing male, alert and oriented x 3 today.   Head- normocephalic, atraumatic Eyes-  Sclera clear, conjunctiva pink Ears-  hearing intact Oropharynx- clear Neck- supple  Lungs- Clear to ausculation bilaterally, normal work of breathing Heart- Regular rate and rhythm, no murmurs, rubs or gallops  GI- soft, NT, ND, + BS Extremities- no clubbing, cyanosis, or edema MS- no significant deformity or atrophy Skin- no rash or lesion Psych- euthymic mood, full affect Neuro- strength and sensation are intact  Wt Readings from Last 3 Encounters:  09/29/22 108.1 kg  09/01/22 104.3 kg  07/03/22 102.1 kg    EKG today demonstrates  A paced rhythm  Vent. rate 79 BPM PR interval 192 ms QRS duration 92 ms QT/QTcB 386/442 ms  Echo 12/04/21 demonstrated  1. Left ventricular ejection fraction, by estimation, is 45 to 50%. The  left ventricle has mildly decreased function. The left ventricle  demonstrates regional wall motion abnormalities with basal to mid  anterolateral hypokinesis. moderate asymmetric septal left ventricular hypertrophy. No LV outflow tract gradient or mitral valve systolic anterior motion. Left ventricular diastolic parameters are indeterminate.   2. Right ventricular systolic function is mildly reduced. The right  ventricular size is mildly enlarged. There is mildly elevated pulmonary  artery systolic pressure. The estimated right ventricular systolic  pressure is [id number] mmHg.   3. Left atrial size was mildly dilated.   4. The mitral valve is abnormal. Severe mitral valve regurgitation with  restriction of the posterior leaflet and inadequate coaptation. ERO 0.47  cm^2 by PISA. Possible infarct-related MR. No evidence of mitral stenosis.   5. There is mild calcification of the aortic valve. Aortic valve  regurgitation is not visualized. No aortic stenosis is present.   6. The inferior vena cava is dilated in size with <50% respiratory  variability, suggesting right atrial pressure of 15 mmHg.   Epic records are reviewed at length today  CHA2DS2-VASc Score = 2  The patient's score is based  upon: CHF History: 1 HTN History: 1 Diabetes History: 0 Stroke History: 0 Vascular Disease History: 0 Age Score: 0 Gender Score: 0       ASSESSMENT AND PLAN: 1. Paroxysmal Atrial Fibrillation (ICD10:  I48.0) The patient's CHA2DS2-VASc score is 2, indicating a 2.2% annual risk of stroke.   S/p afib ablation 09/01/22 Patient appears to be maintaining SR. Continue amiodarone 200 mg daily for now. Anticipate this will be short term post ablation.  Continue Eliquis 5 mg BID with no missed doses for 3 months post ablation. Continue carvedilol 12.5 mg BID Continue diltiazem 240 mg daily  2. Secondary Hypercoagulable State (ICD10:  D68.69) The patient is at significant risk for stroke/thromboembolism based upon his CHA2DS2-VASc Score of 2.  Continue Apixaban (Eliquis).   3. Chronic systolic CHF EF [id number] Fluid status appears stable today.  4. HCM S/p ICD implant, followed by Dr Quentin Ore and the device clinic.   Follow up with Dr Quentin Ore as scheduled.    [facility] [address] [place], [place] [address] [phone] 09/29/2022 11:47 AM

## 2022-10-01 IMAGING — CR DG ANKLE COMPLETE 3+V*R*
3 series · 3 of 3 positions shown · non-contrast
Comparison: X-ray right foot 01/17/2022

CLINICAL DATA: right ankle pain and swelling

EXAM:
RIGHT ANKLE - COMPLETE 3+ VIEW

[ankle ap]
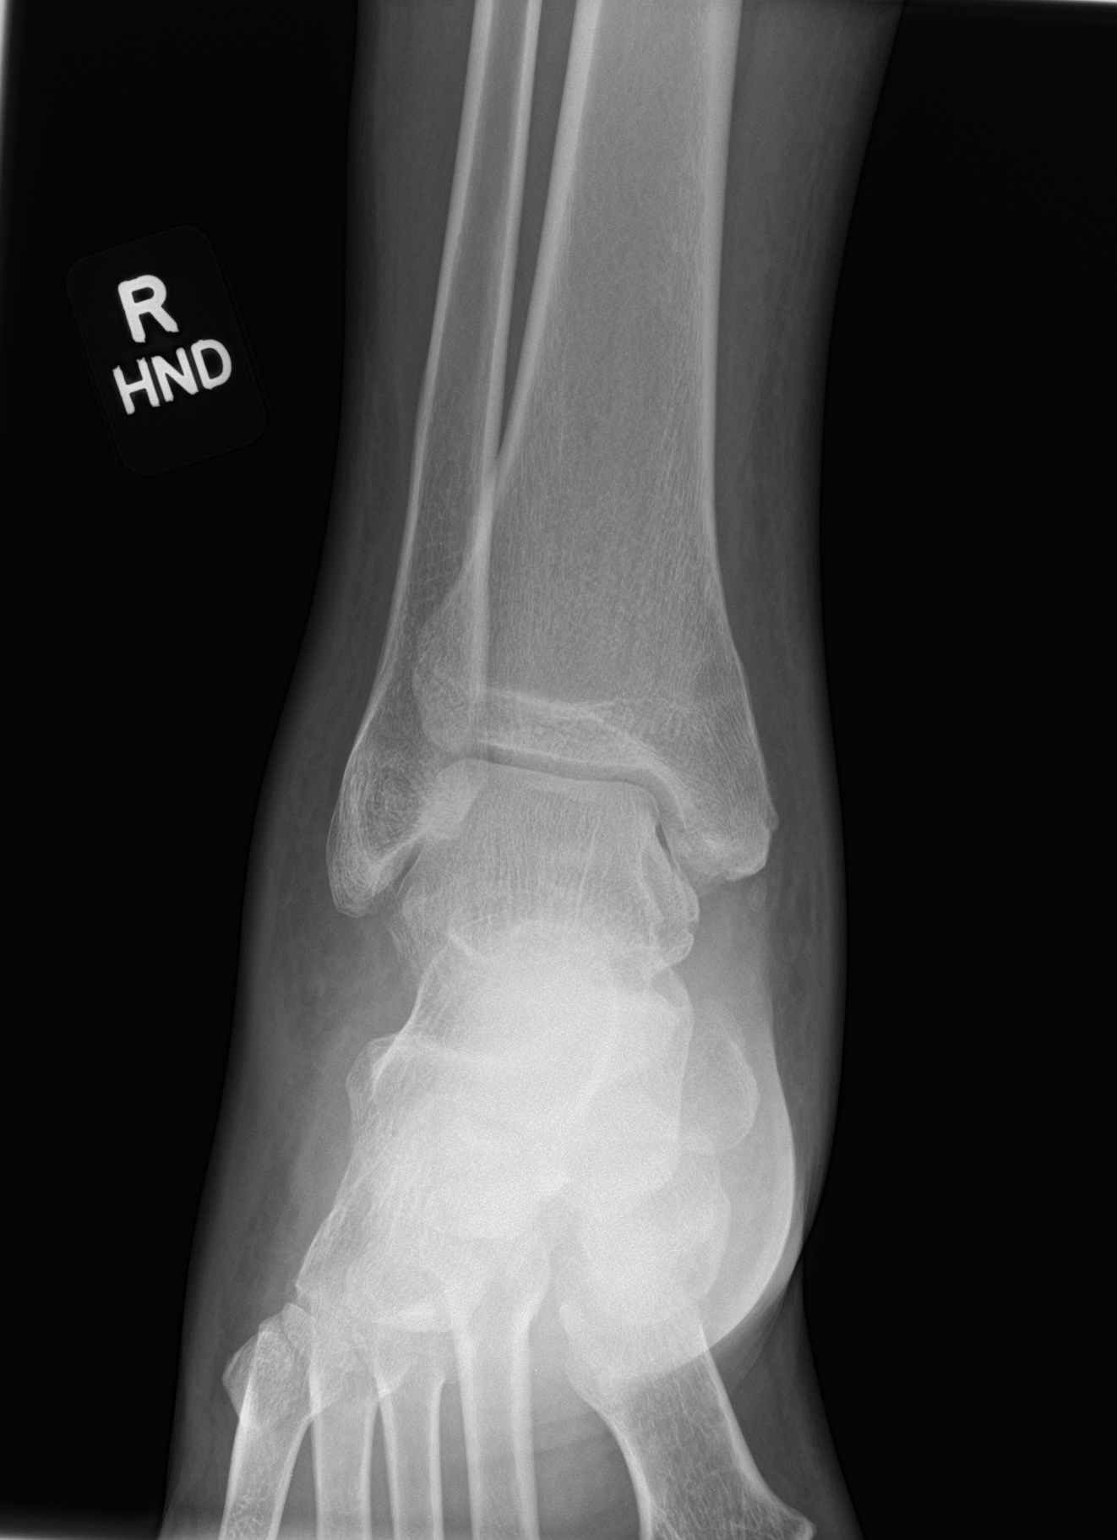

[ankle obl]
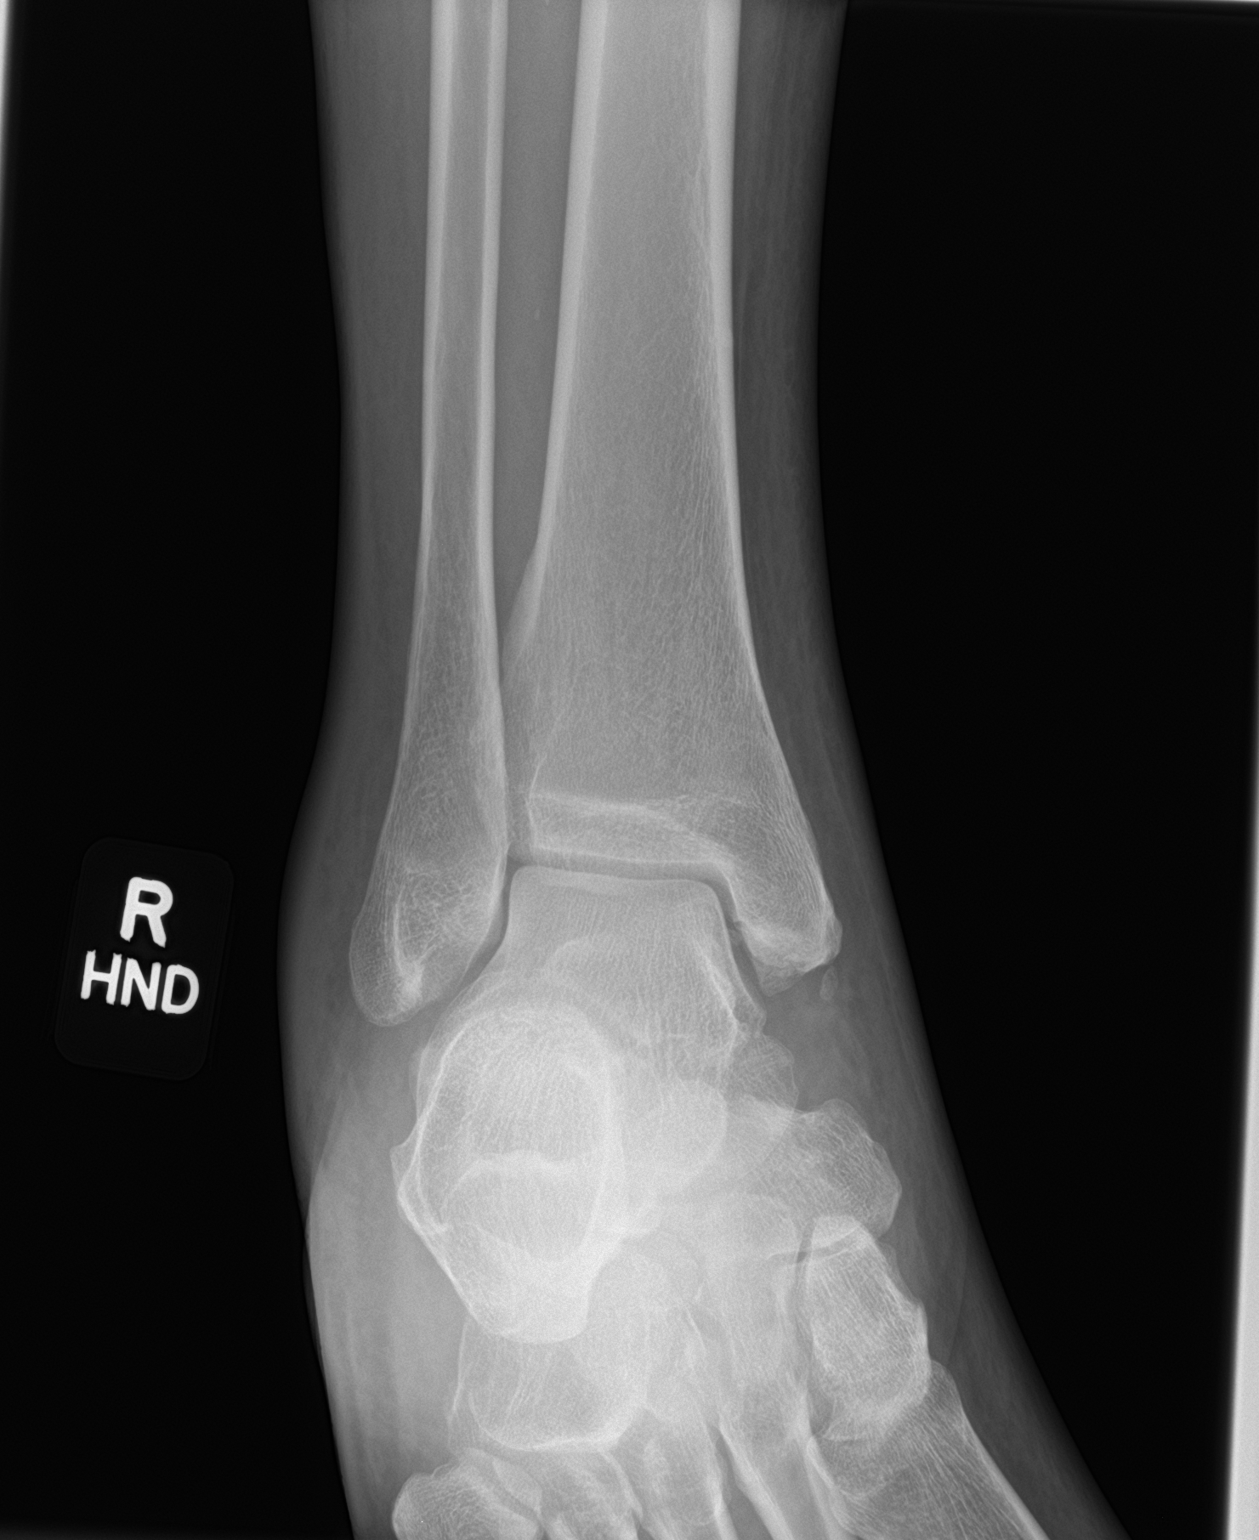

[ankle lat]
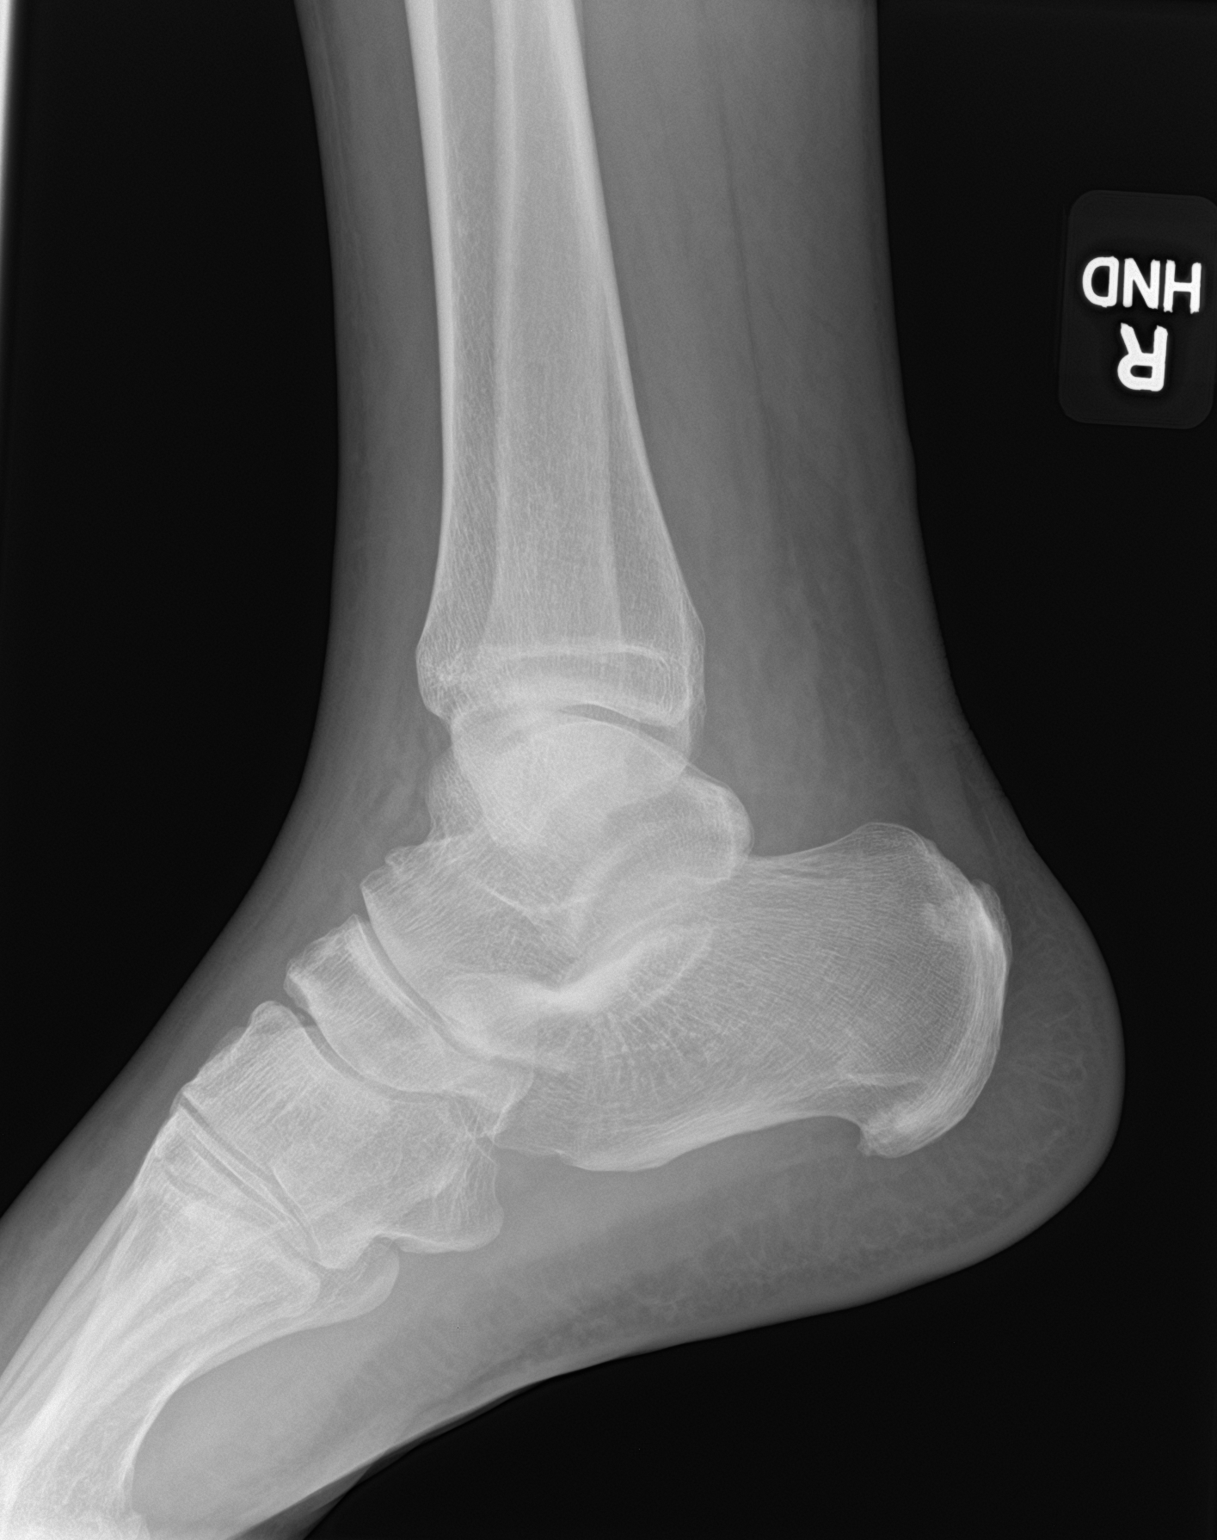

[3 of 3 positions shown; findings below may reference images not displayed]

FINDINGS: There is no evidence of fracture, dislocation, or joint effusion.
There is no evidence of arthropathy or other focal bone abnormality.
Subcutaneus soft tissue edema.
IMPRESSION: No acute displaced fracture or dislocation.

## 2022-10-07 ENCOUNTER — Ambulatory Visit: Payer: No Typology Code available for payment source | Admitting: Cardiology

## 2022-11-27 ENCOUNTER — Ambulatory Visit (HOSPITAL_COMMUNITY)
Admission: RE | Admit: 2022-11-27 | Discharge: 2022-11-27 | Disposition: A | Discharge status: Home / Self Care | Payer: No Typology Code available for payment source | Source: Ambulatory Visit | Attending: Emergency Medicine | Admitting: Emergency Medicine

## 2022-11-27 ENCOUNTER — Encounter (HOSPITAL_COMMUNITY): Payer: Self-pay

## 2022-11-27 VITALS — BP 125/85 | HR 82 | Temp 98.5°F | Resp 18

## 2022-11-27 DIAGNOSIS — Z113 Encounter for screening for infections with a predominantly sexual mode of transmission: Secondary | ICD-10-CM | POA: Insufficient documentation

## 2022-11-27 DIAGNOSIS — Z202 Contact with and (suspected) exposure to infections with a predominantly sexual mode of transmission: Secondary | ICD-10-CM | POA: Diagnosis present

## 2022-11-27 MED ORDER — DOXYCYCLINE HYCLATE 100 MG PO CAPS: 100.0000 mg | ORAL_CAPSULE | Freq: Two times a day (BID) | ORAL | 0 refills | Status: AC

## 2022-11-27 NOTE — ED Triage Notes (Signed)
Patient would like STD testing. Reports his girl tested positive for chlamydia.

## 2022-11-27 NOTE — Discharge Instructions (Signed)
We will call you if anything on your swab returns positive (2-3 days). Please abstain from sexual intercourse until your results return.  I am treating you for chlamydia. Please take the medication as prescribed. Take with food to avoid upset stomach. If your swab happens to come back negative, we will call you and advise you discontinue this medicine.

## 2022-11-27 NOTE — ED Provider Notes (Signed)
[facility]    CSN: [id number] Arrival date & time: 11/27/22  1427      History   Chief Complaint Chief Complaint  Patient presents with   Exposure to STD    HPI Glenn [name] is a 57 y.o. male.  Here for STD testing Reports partner tested positive for chlamydia Had a few episodes of yellow discharge in the mornings Denies testicular pain or swelling  Past Medical History:  Diagnosis Date   A-fib 09/29/15   Cardiomyopathy    Dysrhythmia    Enlarged prostate    Gout    Hypertension     Patient Active Problem List   Diagnosis Date Noted   Hypercoagulable state due to paroxysmal atrial fibrillation 09/29/2022   Acute respiratory failure with hypoxia    AKI (acute kidney injury)    Cardiogenic shock 12/04/2021   Acute on chronic congestive heart failure    Atrial fibrillation with RVR 09/29/2015   Atrial fibrillation 09/29/2015   Paroxysmal atrial fibrillation    Inappropriate shocks from ICD (implantable cardioverter-defibrillator)    Essential hypertension     Past Surgical History:  Procedure Laterality Date   ATRIAL FIBRILLATION ABLATION N/A 09/01/2022   Procedure: ATRIAL FIBRILLATION ABLATION;  Surgeon: Lanier Prude, MD;  [facility];  Service: Cardiovascular;  Laterality: N/A;   CARDIAC DEFIBRILLATOR PLACEMENT  2013   Medtronic Evera dual chamber ICD implanted  by Dr Wendi Maya at [facility]   RIGHT/LEFT HEART CATH AND CORONARY ANGIOGRAPHY N/A 12/08/2021   Procedure: RIGHT/LEFT HEART CATH AND CORONARY ANGIOGRAPHY;  Surgeon: Laurey Morale, MD;  Location: [facility] INVASIVE CV LAB;  Service: Cardiovascular;  Laterality: N/A;       Home Medications    Prior to Admission medications   Medication Sig Start Date End Date Taking? Authorizing Provider  amiodarone (PACERONE) 200 MG tablet Take 2 tablets (400 mg) twice a day through 04/29, then 1 tablet (200 mg) twice a day through 05/06, then 1 tablet daily 12/09/21  Yes Andrey Farmer, PA-C  apixaban (ELIQUIS) 5 MG TABS tablet Take 5 mg by mouth 2 (two) times daily.   Yes [provider]  atorvastatin (LIPITOR) 40 MG tablet Take 40 mg by mouth every evening.   Yes [provider]  carvedilol (COREG) 25 MG tablet Take 12.5 mg by mouth 2 (two) times daily with a meal.   Yes [provider]  doxycycline (VIBRAMYCIN) 100 MG capsule Take 1 capsule (100 mg total) by mouth 2 (two) times daily for 7 days. 11/27/22 12/04/22 Yes Kiko Ripp, Lurena Joiner, PA-C  acetaminophen (TYLENOL) 500 MG tablet Take 1,000 mg by mouth every 6 (six) hours as needed (pain.).    [provider]  Cholecalciferol (VITAMIN D) 50 MCG (2000 UT) tablet Take 2,000 Units by mouth in the morning.    [provider]  colchicine 0.6 MG tablet Take 1 tablet (0.6 mg total) by mouth 2 (two) times daily for 5 days. 09/01/22 09/06/22  Graciella Freer, PA-C  diltiazem (CARDIZEM CD) 240 MG 24 hr capsule Take 240 mg by mouth in the morning.    [provider]  empagliflozin (JARDIANCE) 10 MG TABS tablet Take 1 tablet (10 mg total) by mouth daily. 12/09/21   Andrey Farmer, PA-C  hydrALAZINE (APRESOLINE) 25 MG tablet Take 3 tablets (75 mg total) by mouth every 8 (eight) hours. 12/09/21   Andrey Farmer, PA-C  isosorbide mononitrate (IMDUR) 30 MG 24 hr tablet Take  1 tablet (30 mg total) by mouth daily. 12/09/21   Andrey Farmer, PA-C  losartan (COZAAR) 100 MG tablet Take 50 mg by mouth every evening.    [provider]  pantoprazole (PROTONIX) 40 MG tablet Take 1 tablet (40 mg total) by mouth daily. 09/01/22 10/16/22  Graciella Freer, PA-C  spironolactone (ALDACTONE) 25 MG tablet Take 1 tablet (25 mg total) by mouth daily. 12/09/21   Andrey Farmer, PA-C  tamsulosin (FLOMAX) 0.4 MG CAPS capsule Take 0.4 mg by mouth every evening.    [provider]    Family History Family History  Problem Relation Age of Onset   Diabetes  Mother    Hypertension Mother    Hypertension Father     Social History Social History   Tobacco Use   Smoking status: Never   Smokeless tobacco: Never   Tobacco comments:    Never smoke 09/29/22  Substance Use Topics   Alcohol use: Yes    Comment: weekend use   Drug use: No     Allergies   Ace inhibitors, Penicillins, and Thiazide-type diuretics   Review of Systems Review of Systems As per HPI  Physical Exam Triage Vital Signs ED Triage Vitals [11/27/22 1445]  Enc Vitals Group     BP 125/85     Pulse Rate 82     Resp 18     Temp 98.5 F (36.9 C)     Temp Source Oral     SpO2 98 %     Weight      Height      Head Circumference      Peak Flow      Pain Score      Pain Loc      Pain Edu?      Excl. in GC?    No data found.  Updated Vital Signs BP 125/85 (BP Location: Left Arm)   Pulse 82   Temp 98.5 F (36.9 C) (Oral)   Resp 18   SpO2 98%    Physical Exam Vitals and nursing note reviewed.  Constitutional:      General: He is not in acute distress.    Appearance: Normal appearance.  HENT:     Mouth/Throat:     Pharynx: Oropharynx is clear.  Cardiovascular:     Rate and Rhythm: Normal rate and regular rhythm.     Pulses: Normal pulses.     Heart sounds: Normal heart sounds.  Pulmonary:     Effort: Pulmonary effort is normal.     Breath sounds: Normal breath sounds.  Neurological:     Mental Status: He is alert and oriented to person, place, and time.      UC Treatments / Results  Labs (all labs ordered are listed, but only abnormal results are displayed) Labs Reviewed  CYTOLOGY, (ORAL, ANAL, URETHRAL) ANCILLARY ONLY    EKG   Radiology No results found.  Procedures Procedures (including critical care time)  Medications Ordered in UC Medications - No data to display  Initial Impression / Assessment and Plan / UC Course  I have reviewed the triage vital signs and the nursing notes.  Pertinent labs & imaging results that  were available during my care of the patient were reviewed by me and considered in my medical decision making (see chart for details).  Cytology swab is pending With his exposure to chlamydia and discharge, will cover with doxycycline BID x 7.  Patient understands if swab returns negative  we will call him and discontinue the antibiotic.  Final Clinical Impressions(s) / UC Diagnoses   Final diagnoses:  Exposure to chlamydia  Screen for STD (sexually transmitted disease)     Discharge Instructions      We will call you if anything on your swab returns positive (2-3 days). Please abstain from sexual intercourse until your results return.  I am treating you for chlamydia. Please take the medication as prescribed. Take with food to avoid upset stomach. If your swab happens to come back negative, we will call you and advise you discontinue this medicine.    ED Prescriptions     Medication Sig Dispense Auth. Provider   doxycycline (VIBRAMYCIN) 100 MG capsule Take 1 capsule (100 mg total) by mouth 2 (two) times daily for 7 days. 14 capsule [employer], Lurena Joiner, PA-C      PDMP not reviewed this encounter.   Amiya Escamilla, Ray Church 11/27/22 1506

## 2022-12-01 LAB — CYTOLOGY, (ORAL, ANAL, URETHRAL) ANCILLARY ONLY
Chlamydia: NEGATIVE
Comment: NEGATIVE
Comment: NEGATIVE
Comment: NORMAL
Neisseria Gonorrhea: NEGATIVE
Trichomonas: NEGATIVE

## 2022-12-02 ENCOUNTER — Ambulatory Visit: Payer: No Typology Code available for payment source | Attending: Cardiology | Admitting: Cardiology

## 2022-12-02 ENCOUNTER — Encounter: Payer: Self-pay | Admitting: Cardiology

## 2022-12-02 VITALS — BP 122/80 | HR 80 | Ht 72.0 in | Wt 244.8 lb

## 2022-12-02 DIAGNOSIS — Z95810 Presence of automatic (implantable) cardiac defibrillator: Secondary | ICD-10-CM

## 2022-12-02 DIAGNOSIS — I480 Paroxysmal atrial fibrillation: Secondary | ICD-10-CM

## 2022-12-02 DIAGNOSIS — I5022 Chronic systolic (congestive) heart failure: Secondary | ICD-10-CM | POA: Diagnosis not present

## 2022-12-02 NOTE — Patient Instructions (Addendum)
Medication Instructions:  Your physician has recommended you make the following change in your medication: STOP TAKING: Amiodarone today, 12/02/2022.    Lab Work: None ordered.  If you have labs (blood work) drawn today and your tests are completely normal, you will receive your results only by: MyChart Message (if you have MyChart) OR A paper copy in the mail If you have any lab test that is abnormal or we need to change your treatment, we will call you to review the results.  Testing/Procedures: None ordered.  Follow-Up: At [facility], you and your health needs are our priority.  As part of our continuing mission to provide you with exceptional heart care, we have created designated Provider Care Teams.  These Care Teams include your primary Cardiologist (physician) and Advanced Practice Providers (APPs -  Physician Assistants and Nurse Practitioners) who all work together to provide you with the care you need, when you need it.  Your next appointment:   Please schedule a 6 month EP APP follow up appointment per Dr. Steffanie Dunn.     The format for your next appointment:   In Person  Provider:   Steffanie Dunn, MD{or one of the following Advanced Practice Providers on your designated Care Team:    NOTE:  Second work note completed, put in an envelope, and placed at [employer] front desk.  Front desk staff asked to call patient on 12/03/2022, and advise Mr. Froman that his work note is ready for pickup.

## 2022-12-02 NOTE — Progress Notes (Signed)
Electrophysiology Office Follow up Visit Note:    Date:  12/02/2022   ID:  Glenn [name], DOB 1965/09/16, MRN [id number]  PCP:  Patient, No Pcp Per  [facility] Cardiologist:  None  [facility] Electrophysiologist:  Lanier Prude, MD    Interval History:    Glenn [name] is a 57 y.o. male who presents for a follow up device check of MDT ICD. He is 3 months post ablation 09/01/22.  At his follow-up with Jorja Loa, PA 09/2022 he was doing well and maintaining sinus rhythm on amiodarone 200 mg daily. Continued Eliquis 5 mg BID with no missed doses for 3 months post ablation. Continued carvedilol 12.5 mg BID and diltiazem 240 mg daily.  Today, he has been feeling okay since his ablation.   He is compliant with his Eliquis and amiodarone. No bleeding issues.  His blood pressure is well controlled today.  He has not returned to work for unclear reasons.  He denies any palpitations, chest pain, shortness of breath, or peripheral edema. No lightheadedness, headaches, syncope, orthopnea, or PND.  Past medical, surgical, social, family histories reviewed.   ROS:   Please see the history of present illness.    All other systems reviewed and are negative.  EKGs/Labs/Other Studies Reviewed:    The following studies were reviewed today:  12/02/2022 In clinic device interrogation personally reviewed: Battery longevity 10.8 years Lead parameter stable No atrial fibrillation since the middle of February Atrial pacing 40.9% Ventricular pacing 0.3%  EKG:  EKG is personally reviewed.  12/02/2022: Atrial pacing, ventricular sensing    Physical Exam:    VS:  BP 122/80   Pulse 80   Ht 6' (1.829 m)   Wt 244 lb 12.8 oz (111 kg)   SpO2 98%   BMI 33.20 kg/m     Wt Readings from Last 3 Encounters:  12/02/22 244 lb 12.8 oz (111 kg)  09/29/22 238 lb 6.4 oz (108.1 kg)  09/01/22 230 lb (104.3 kg)     GEN: Well nourished, well developed in no acute distress CARDIAC: RRR, no  murmurs, rubs, gallops. ICD pocket well healed. PSYCHIATRIC:  Normal affect        ASSESSMENT:    1. Paroxysmal atrial fibrillation   2. ICD (implantable cardioverter-defibrillator) in place   3. Chronic systolic heart failure    PLAN:    In order of problems listed above:  #Paroxysmal atrial fibrillation Doing well after his catheter ablation.  Had a flurry of atrial fibrillation shortly after his ablation but this seems to have cooled off now on his device interrogation today.  I would recommend stopping his amiodarone today and continue carefully monitoring his A-fib burden using his device.  Continue Eliquis for stroke prophylaxis.  #ICD in situ Device functioning appropriately.  Continue remote monitoring.    #Chronic systolic heart failure NYHA class II.  Continue current medical therapy.  He was requesting documentation for long-term work excuses.  I recommended he follow-up with his primary care physician regarding possible disability paperwork.  I did provide him a letter excusing him from work around the time of his catheter ablation procedure.  Follow-up in 6 months with APP.    Medication Adjustments/Labs and Tests Ordered: Current medicines are reviewed at length with the patient today.  Concerns regarding medicines are outlined above.   Orders Placed This Encounter  Procedures   EKG 12-Lead   No orders of the defined types were placed in this encounter.   I,Mathew  Stumpf,acting as a scribe for Lanier Prude, MD.,hNeurosurgeon documented all relevant documentation on the behalf of Lanier Prude, MD,as directed by  Lanier Prude, MD while in the presence of Lanier Prude, MD.  I, Lanier Prude, MD, have reviewed all documentation for this visit. The documentation on 12/02/22 for the exam, diagnosis, procedures, and orders are all accurate and complete.  Signed, Steffanie Dunn, MD, [facility], [facility] 12/02/2022 7:38 PM    Electrophysiology [facility]

## 2022-12-03 ENCOUNTER — Telehealth: Payer: Self-pay | Admitting: Cardiology

## 2022-12-03 NOTE — Telephone Encounter (Signed)
[facility] is calling to get patients office notes faxed to them.   Fax # [phone]

## 2022-12-04 NOTE — Telephone Encounter (Signed)
Attempted to call the patient, unable to leave VM message due to VM box is full. Office need [facility]. Will forward to nurse.

## 2023-01-08 DIAGNOSIS — I10 Essential (primary) hypertension: Secondary | ICD-10-CM | POA: Diagnosis not present

## 2023-01-08 DIAGNOSIS — Z8042 Family history of malignant neoplasm of prostate: Secondary | ICD-10-CM | POA: Diagnosis not present

## 2023-01-08 DIAGNOSIS — Z8249 Family history of ischemic heart disease and other diseases of the circulatory system: Secondary | ICD-10-CM | POA: Diagnosis not present

## 2023-01-08 DIAGNOSIS — E669 Obesity, unspecified: Secondary | ICD-10-CM | POA: Diagnosis not present

## 2023-01-08 DIAGNOSIS — E785 Hyperlipidemia, unspecified: Secondary | ICD-10-CM | POA: Diagnosis not present

## 2023-01-08 DIAGNOSIS — Z833 Family history of diabetes mellitus: Secondary | ICD-10-CM | POA: Diagnosis not present

## 2023-01-08 DIAGNOSIS — Z880 Allergy status to penicillin: Secondary | ICD-10-CM | POA: Diagnosis not present

## 2023-01-08 DIAGNOSIS — D6869 Other thrombophilia: Secondary | ICD-10-CM | POA: Diagnosis not present

## 2023-01-08 DIAGNOSIS — Z6833 Body mass index (BMI) 33.0-33.9, adult: Secondary | ICD-10-CM | POA: Diagnosis not present

## 2023-01-08 DIAGNOSIS — Z95810 Presence of automatic (implantable) cardiac defibrillator: Secondary | ICD-10-CM | POA: Diagnosis not present

## 2023-01-08 DIAGNOSIS — I4891 Unspecified atrial fibrillation: Secondary | ICD-10-CM | POA: Diagnosis not present

## 2023-01-08 DIAGNOSIS — Z7901 Long term (current) use of anticoagulants: Secondary | ICD-10-CM | POA: Diagnosis not present

## 2023-05-14 ENCOUNTER — Encounter: Payer: No Typology Code available for payment source | Admitting: Physician Assistant

## 2023-06-10 NOTE — Progress Notes (Deleted)
Electrophysiology Office Note:   Date:  06/10/2023  ID:  Glenn [name], DOB [birth year], MRN [id number]  Primary Cardiologist: None Electrophysiologist: Glenn Prude, MD   {Click to update primary MD,subspecialty MD or APP then REFRESH:1}    History of Present Illness:   Glenn [name] is a 57 y.o. male with h/o paroxysmal AF, HTN, hypertrophic cardiomyopathy, VT s/p ICD seen today for routine electrophysiology followup.   Seen 12/02/22 by Dr. Lalla Brothers & was doing well post ablation. He had a flurry of AF shortly after ablation but it defervesced. His amiodarone was stopped at that visit.    Since last being seen in our clinic the patient reports doing ***.  he denies chest pain, palpitations, dyspnea, PND, orthopnea, nausea, vomiting, dizziness, syncope, edema, weight gain, or early satiety.   Review of systems complete and found to be negative unless listed in HPI.   EP Information / Studies Reviewed:    EKG is not ordered today. EKG from 12/02/22 reviewed which showed AP 79 bpm     ICD Interrogation-  reviewed in detail today,  See PACEART report.  Device History: Follows at [facility], not dependent Medtronic serial # [id number] S Type: Cobalt XT DR [id number], dual chamber ICD implanted 01/13/22 for VT, HCM History of appropriate therapy: {yes/no:20286} History of AAD therapy: Yes; previously tolerated amiodarone     Studies:  TEE 11/2021 > LVEF 50%, LV mildly decreased function, LV regional wall motion abnormalities with mild anterolateral hypokinesis, moderate asymmetric basal septal left ventricular hypertrophy, no PFO/ASD R/LHC 11/2021 > mid Cx lesion 90% stenosed, normal filling pressures, normal CO CT Cardiac Morphology 06/2022 > normal pulmonary drainage into the LA, no LAA thrombus, no PFO/ASD, normal coronary origin / right dominance, CAC score of 42.2 / 78th percentile for matched control, mild dilated PA suggestive of PH, severe basal septal hypertrophy   Arrhythmia / AAD AF > s/p  ablation 09/01/22    Risk Assessment/Calculations:    CHA2DS2-VASc Score = 2  {Confirm score is correct.  If not, click here to update score.  REFRESH note.  :1} This indicates a 2.2% annual risk of stroke. The patient's score is based upon: CHF History: 1 HTN History: 1 Diabetes History: 0 Stroke History: 0 Vascular Disease History: 0 Age Score: 0 Gender Score: 0   {This patient has a significant risk of stroke if diagnosed with atrial fibrillation.  Please consider VKA or DOAC agent for anticoagulation if the bleeding risk is acceptable.   You can also use the SmartPhrase .[email or web address] for documentation.   :[id number]} No BP recorded.  {Refresh Note OR Click here to enter BP  :1}***        Physical Exam:   VS:  There were no vitals taken for this visit.   Wt Readings from Last 3 Encounters:  12/02/22 244 lb 12.8 oz (111 kg)  09/29/22 238 lb 6.4 oz (108.1 kg)  09/01/22 230 lb (104.3 kg)     GEN: Well nourished, well developed in no acute distress NECK: No JVD; No carotid bruits CARDIAC: {EPRHYTHM:28826}, no murmurs, rubs, gallops RESPIRATORY:  Clear to auscultation without rales, wheezing or rhonchi  ABDOMEN: Soft, non-tender, non-distended EXTREMITIES:  No edema; No deformity   ASSESSMENT AND PLAN:    HCM / VT / Chronic Systolic Dysfunction s/p Medtronic dual chamber ICD  NYHA II. Followed at [facility] in [place]. -euvolemic today -Stable on an appropriate medical regimen -Normal ICD function -See Pace Art report -No changes today  Paroxysmal Atrial Fibrillation  Secondary Hypercoagulable State CHA2DS2-VASc 2 -Eliquis for stroke prophylaxis  -update labs > CBC, BMP ***  Secondary Hypercoagulable State  -OAC as above  Disposition:   Follow up with {EPPROVIDERS:28135} {EPFOLLOW ON:62952}   Signed, Glenn Brim, MSN, APRN, NP-C, AGACNP-BC [facility] - Electrophysiology  06/10/2023, 7:58 AM

## 2023-06-11 ENCOUNTER — Ambulatory Visit: Payer: No Typology Code available for payment source | Attending: Physician Assistant | Admitting: Physician Assistant

## 2023-06-11 DIAGNOSIS — D6869 Other thrombophilia: Secondary | ICD-10-CM

## 2023-06-11 DIAGNOSIS — I480 Paroxysmal atrial fibrillation: Secondary | ICD-10-CM

## 2023-06-11 DIAGNOSIS — Z95810 Presence of automatic (implantable) cardiac defibrillator: Secondary | ICD-10-CM

## 2023-06-14 ENCOUNTER — Encounter: Payer: Self-pay | Admitting: Physician Assistant

## 2023-07-01 ENCOUNTER — Telehealth: Payer: Self-pay | Admitting: Cardiology

## 2023-07-01 NOTE — Telephone Encounter (Signed)
Patient would like to discontinue care with Dr. Lalla Brothers going forward. He states the [place] will no longer cover care under Dr. Lalla Brothers, and they prefer for patient to be seen by their provider.

## 2023-07-02 NOTE — Telephone Encounter (Signed)
We never followed the patient remotely.

## 2024-04-11 ENCOUNTER — Ambulatory Visit (INDEPENDENT_AMBULATORY_CARE_PROVIDER_SITE_OTHER): Admitting: Gastroenterology

## 2024-04-11 ENCOUNTER — Encounter: Payer: Self-pay | Admitting: Gastroenterology

## 2024-04-11 VITALS — BP 132/90 | HR 74 | Ht 72.0 in | Wt 247.8 lb

## 2024-04-11 DIAGNOSIS — Z860100 Personal history of colon polyps, unspecified: Secondary | ICD-10-CM | POA: Diagnosis not present

## 2024-04-11 DIAGNOSIS — Z01818 Encounter for other preprocedural examination: Secondary | ICD-10-CM | POA: Diagnosis not present

## 2024-04-11 DIAGNOSIS — Z7901 Long term (current) use of anticoagulants: Secondary | ICD-10-CM | POA: Diagnosis not present

## 2024-04-11 MED ORDER — NA SULFATE-K SULFATE-MG SULF 17.5-3.13-1.6 GM/177ML PO SOLN: 1.0000 | Freq: Once | ORAL | 0 refills | Status: AC

## 2024-04-11 NOTE — Progress Notes (Signed)
 04/11/2024 Glenn [name] [id number] [birth year]   HISTORY OF PRESENT ILLNESS: This is a 58 year old male who is new to our office.  [facility].  From their notes it appears that the last colonoscopy was in 2017 at [facility] with a small adenoma removed.  I do not have the actual procedure records.  He says that he has soft bowel movements, but not diarrhea per se.  Some days he will have no bowel movements and then other days he will have 2 or 3.  Sometimes with urgency.  No rectal bleeding.  He is on Eliquis  for history of atrial fibrillation.  Had an ablation in January 2024.  Follows with cardiology through the [place] at this point.  No other GI complaints.  Past Medical History:  Diagnosis Date   A-fib (HCC) 09/29/15   Cardiomyopathy (HCC)    Dysrhythmia    Enlarged prostate    Gout    Hypertension    Past Surgical History:  Procedure Laterality Date   ATRIAL FIBRILLATION ABLATION N/A 09/01/2022   Procedure: ATRIAL FIBRILLATION ABLATION;  Surgeon: Cindie Ole ONEIDA, MD;  [facility];  Service: Cardiovascular;  Laterality: N/A;   CARDIAC DEFIBRILLATOR PLACEMENT  2013   Medtronic Evera dual chamber ICD implanted  by Dr Van at [facility]   RIGHT/LEFT HEART CATH AND CORONARY ANGIOGRAPHY N/A 12/08/2021   Procedure: RIGHT/LEFT HEART CATH AND CORONARY ANGIOGRAPHY;  Surgeon: Rolan Ezra RAMAN, MD;  Location: [facility] INVASIVE CV LAB;  Service: Cardiovascular;  Laterality: N/A;    reports that he has never smoked. He has never used smokeless tobacco. He reports current alcohol use. He reports that he does not use drugs. family history includes Diabetes in his mother; Hypertension in his father and mother. Allergies  Allergen Reactions   Ace Inhibitors Swelling    Swelling lips   Penicillins Other (See Comments)    Childhood allergy   Thiazide-Type Diuretics Other (See Comments)    Unsure of reaction      Outpatient  Encounter Medications as of 04/11/2024  Medication Sig   acetaminophen  (TYLENOL ) 500 MG tablet Take 1,000 mg by mouth every 6 (six) hours as needed (pain.).   apixaban  (ELIQUIS ) 5 MG TABS tablet Take 5 mg by mouth 2 (two) times daily.   atorvastatin  (LIPITOR ) 40 MG tablet Take 40 mg by mouth every evening.   carvedilol  (COREG ) 25 MG tablet Take 12.5 mg by mouth 2 (two) times daily with a meal.   Cholecalciferol (VITAMIN D) 50 MCG (2000 UT) tablet Take 2,000 Units by mouth in the morning.   diltiazem  (CARDIZEM  CD) 240 MG 24 hr capsule Take 240 mg by mouth in the morning.   losartan (COZAAR) 100 MG tablet Take 50 mg by mouth every evening.   spironolactone  (ALDACTONE ) 25 MG tablet Take 1 tablet (25 mg total) by mouth daily.   tamsulosin  (FLOMAX ) 0.4 MG CAPS capsule Take 0.4 mg by mouth every evening.   [DISCONTINUED] colchicine  0.6 MG tablet Take 1 tablet (0.6 mg total) by mouth 2 (two) times daily for 5 days.   [DISCONTINUED] empagliflozin  (JARDIANCE ) 10 MG TABS tablet Take 1 tablet (10 mg total) by mouth daily.   [DISCONTINUED] hydrALAZINE  (APRESOLINE ) 25 MG tablet Take 3 tablets (75 mg total) by mouth every 8 (eight) hours.   [DISCONTINUED] isosorbide  mononitrate (IMDUR ) 30 MG 24 hr tablet Take 1 tablet (30 mg total) by mouth daily.   [  DISCONTINUED] pantoprazole  (PROTONIX ) 40 MG tablet Take 1 tablet (40 mg total) by mouth daily.   No facility-administered encounter medications on file as of 04/11/2024.     REVIEW OF SYSTEMS  : All other systems reviewed and negative except where noted in the History of Present Illness.   PHYSICAL EXAM: BP (!) 132/100   Pulse 74   Ht 6' (1.829 m)   Wt 247 lb 12.8 oz (112.4 kg)   BMI 33.61 kg/m  General: Well developed AA male in no acute distress Head: Normocephalic and atraumatic Eyes:  sclerae anicteric,conjunctive pink. Ears: Normal auditory acuity Lungs: Clear throughout to auscultation; no W/R/R. Heart: Regular rate and rhythm; no  M/R/G. Rectal:  Will be done at the time of colonoscopy. Musculoskeletal: Symmetrical with no gross deformities  Skin: No lesions on visible extremities Neurological: Alert oriented x 4, grossly non-focal Psychological:  Alert and cooperative. Normal mood and affect  ASSESSMENT AND PLAN: *Personal history of colon polyps: Sent here from the [place] to discuss and schedule colonoscopy.  From their notes it appears that the last colonoscopy was in 2017 at [facility] with a small adenoma removed.  I do not have the actual procedure records.  Will schedule with Dr. Legrand. *Chronic anticoagulation use with Eliquis  due to history of atrial fibrillation: Had an ablation in January 2024.  Now following with cardiology through the [place].  Will hold Eliquis  for 2 days prior to endoscopic procedures - will instruct when and how to resume after procedure. Benefits and risks of procedure explained including risks of bleeding, perforation, infection, missed lesions, reactions to medications and possible need for hospitalization and surgery for complications. Additional rare but real risk of stroke or other vascular clotting events off of Eliquis  also explained and need to seek urgent help if any signs of these problems occur. Will communicate by phone or EMR with patient's prescribing provider to confirm that holding Eliquis  is reasonable in this case.     CC:  Ankabrandt, Emily, PA

## 2024-04-11 NOTE — Progress Notes (Signed)
 ____________________________________________________________  Attending physician addendum:  Thank you for sending this case to me. I have reviewed the entire note and agree with the plan.   Victory Brand, MD  ____________________________________________________________

## 2024-04-11 NOTE — Patient Instructions (Signed)
 You will be contacted by our office prior to your procedure for directions on holding your Eliquis .  If you do not hear from our office 1 week prior to your scheduled procedure, please call [phone] to discuss.   You have been scheduled for a colonoscopy. Please follow written instructions given to you at your visit today.   If you use inhalers (even only as needed), please bring them with you on the day of your procedure.  DO NOT TAKE 7 DAYS PRIOR TO TEST- Trulicity (dulaglutide) Ozempic, Wegovy (semaglutide) Mounjaro (tirzepatide) Bydureon Bcise (exanatide extended release)  DO NOT TAKE 1 DAY PRIOR TO YOUR TEST Rybelsus (semaglutide) Adlyxin (lixisenatide) Victoza (liraglutide) Byetta (exanatide) ___________________________________________________________________________

## 2024-04-18 ENCOUNTER — Telehealth: Payer: Self-pay | Admitting: *Deleted

## 2024-04-19 ENCOUNTER — Telehealth: Payer: Self-pay | Admitting: Gastroenterology

## 2024-04-19 NOTE — Telephone Encounter (Signed)
 Inbound call from [place] rep named Elenor Crest, she stated that she was returning a call back to [employer] and wanted to know if heather had a form to fill out and send it to them in order to hold pt eliquis . A good contact number for Elenor is [phone] [phone]. Please advise.

## 2024-04-19 NOTE — Telephone Encounter (Signed)
 Faxed clearance request to Prentice Purdue, PA at [place] at [phone]

## 2024-04-19 NOTE — Telephone Encounter (Signed)
 Left message for Elenor at [facility] to call back.

## 2024-04-20 NOTE — Telephone Encounter (Signed)
 Patient informed he may hold Eliquis . Patient voiced understanding.

## 2024-04-20 NOTE — Telephone Encounter (Signed)
 Left message for patient to call back. Per Prentice Purdue, PA at [facility], patient is cleared to hold Eliquis  2 days prior to upcoming procedure. Clearance sent to be scanned in chart.

## 2024-04-20 NOTE — Telephone Encounter (Signed)
 Received clearance from [facility].

## 2024-05-04 ENCOUNTER — Ambulatory Visit: Admitting: Gastroenterology

## 2024-05-04 ENCOUNTER — Encounter: Payer: Self-pay | Admitting: Gastroenterology

## 2024-05-04 VITALS — BP 138/92 | HR 60 | Temp 97.7°F | Resp 16 | Ht 72.0 in | Wt 247.0 lb

## 2024-05-04 DIAGNOSIS — Z860100 Personal history of colon polyps, unspecified: Secondary | ICD-10-CM

## 2024-05-04 MED ORDER — PEG 3350-KCL-NA BICARB-NACL 420 G PO SOLR: 4000.0000 mL | Freq: Once | ORAL | 0 refills | Status: AC

## 2024-05-04 MED ORDER — SODIUM CHLORIDE 0.9 % IV SOLN: 500.0000 mL | Freq: Once | INTRAVENOUS | Status: AC

## 2024-05-04 NOTE — Progress Notes (Signed)
 Pt ate solid foods 05-03-24 at 1900- clear liquids besides that.  Drank entire prep and states results are green liquids, no solid stool.  Dr. Legrand made aware- pt instructed that if he confident his bowels are cleaned out ok to proceed.  But if any stool in the colon, procedure may have to be aborted.  Pt states he feels his bowels are cleaned out and would like to proceed.

## 2024-05-04 NOTE — Progress Notes (Signed)
 Patient here for a surveillance colonoscopy. Initial question of prep quality and he had solid food last evening. Got to procedure room, then had to use rest room.  Output is not clear or sufficient for a colonoscopy today.  Will be rescheduled with PEG prep and closer attention to preprocedure dietary restrictions. Will hold Eliquis  for 2 days before the next procedure.  Resume Eliquis  today.  VEAR Brand MD

## 2024-06-22 ENCOUNTER — Ambulatory Visit (AMBULATORY_SURGERY_CENTER): Admitting: Gastroenterology

## 2024-06-22 ENCOUNTER — Encounter: Payer: Self-pay | Admitting: Gastroenterology

## 2024-06-22 VITALS — BP 121/76 | HR 60 | Temp 97.7°F | Resp 16 | Ht 72.0 in | Wt 247.0 lb

## 2024-06-22 DIAGNOSIS — Z860100 Personal history of colon polyps, unspecified: Secondary | ICD-10-CM

## 2024-06-22 DIAGNOSIS — Z1211 Encounter for screening for malignant neoplasm of colon: Secondary | ICD-10-CM | POA: Diagnosis present

## 2024-06-22 DIAGNOSIS — Z860101 Personal history of adenomatous and serrated colon polyps: Secondary | ICD-10-CM

## 2024-06-22 DIAGNOSIS — K648 Other hemorrhoids: Secondary | ICD-10-CM | POA: Diagnosis not present

## 2024-06-22 MED ORDER — SODIUM CHLORIDE 0.9 % IV SOLN: 500.0000 mL | Freq: Once | INTRAVENOUS | Status: DC

## 2024-06-22 NOTE — Progress Notes (Signed)
 History and Physical:  This patient presents for endoscopic testing for: Encounter Diagnosis  Name Primary?   History of colonic polyps Yes    58 year old [name] here today for surveillance colonoscopy, having been referred by the [facility].  He was seen in our office 04/11/2024, and there have been no significant clinical changes since then.  He has been off oral anticoagulation the last 2 days. He had been scheduled for colonoscopy with us  on 05/04/2024, but it was determined that he had not followed bowel prep and preprocedure dietary instructions properly, thus his procedure was canceled and rescheduled to today. Patient is otherwise without complaints or active issues today.   Past Medical History: Past Medical History:  Diagnosis Date   A-fib (HCC) 09/29/2015   Cardiomyopathy (HCC)    Dysrhythmia    Enlarged prostate    Gout    Heart murmur    Hyperlipidemia    Hypertension    Sleep apnea    wears CPAP     Past Surgical History: Past Surgical History:  Procedure Laterality Date   ATRIAL FIBRILLATION ABLATION N/A 09/01/2022   Procedure: ATRIAL FIBRILLATION ABLATION;  Surgeon: Cindie Ole DASEN, MD;  Location: [facility] INVASIVE CV LAB;  Service: Cardiovascular;  Laterality: N/A;   CARDIAC DEFIBRILLATOR PLACEMENT  08/18/2011   [employer] Evera dual chamber ICD implanted  by Dr Van at [facility]   COLONOSCOPY     x2   RIGHT/LEFT HEART CATH AND CORONARY ANGIOGRAPHY N/A 12/08/2021   Procedure: RIGHT/LEFT HEART CATH AND CORONARY ANGIOGRAPHY;  Surgeon: Rolan Ezra RAMAN, MD;  Location: [facility] INVASIVE CV LAB;  Service: Cardiovascular;  Laterality: N/A;    Allergies: Allergies  Allergen Reactions   Ace Inhibitors Swelling    Swelling lips   Penicillins Other (See Comments)    Childhood allergy   Thiazide-Type Diuretics Other (See Comments)    Unsure of reaction    Outpatient Meds: Current Outpatient Medications  Medication Sig Dispense Refill   atorvastatin  (LIPITOR ) 40  MG tablet Take 40 mg by mouth every evening.     carvedilol  (COREG ) 25 MG tablet Take 12.5 mg by mouth 2 (two) times daily with a meal.     Cholecalciferol (VITAMIN D) Glenn MCG (2000 UT) tablet Take 2,000 Units by mouth in the morning.     diltiazem  (CARDIZEM  CD) 240 MG 24 hr capsule Take 240 mg by mouth in the morning.     losartan (COZAAR) 100 MG tablet Take Glenn mg by mouth every evening.     spironolactone  (ALDACTONE ) 25 MG tablet Take 1 tablet (25 mg total) by mouth daily. 30 tablet 1   tamsulosin  (FLOMAX ) 0.4 MG CAPS capsule Take 0.4 mg by mouth every evening.     acetaminophen  (TYLENOL ) 500 MG tablet Take 1,000 mg by mouth every 6 (six) hours as needed (pain.).     apixaban  (ELIQUIS ) 5 MG TABS tablet Take 5 mg by mouth 2 (two) times daily.     colchicine  0.6 MG tablet Take 0.6 mg by mouth. (Patient not taking: Reported on 06/22/2024)     Current Facility-Administered Medications  Medication Dose Route Frequency Provider Last Rate Last Admin   0.9 %  sodium chloride  infusion  500 mL Intravenous Once Danis, Laveta Gilkey L III, MD       0.9 %  sodium chloride  infusion  500 mL Intravenous Once Danis, Vash Quezada L III, MD          ___________________________________________________________________ Objective   Exam:  BP 122/77  Pulse 62   Temp 97.7 F (36.5 C) (Skin)   Ht 6' (1.829 m)   Wt 247 lb (112 kg)   SpO2 98%   BMI 33.Glenn kg/m   CV: regular , S1/S2 Resp: clear to auscultation bilaterally, normal RR and effort noted GI: soft, no tenderness, with active bowel sounds.   Assessment: Encounter Diagnosis  Name Primary?   History of colonic polyps Yes     Plan: Colonoscopy   The benefits and risks of the planned procedure(s) were described in detail with the patient or (when appropriate) their health care proxy.  Risks were outlined as including, but not limited to, bleeding, infection, perforation, adverse medication reaction leading to cardiac or pulmonary decompensation,  pancreatitis (if ERCP).  The limitation of incomplete mucosal visualization was also discussed.  No guarantees or warranties were given.  The patient is appropriate for an endoscopic procedure in the ambulatory setting.   - Victory Brand, MD

## 2024-06-22 NOTE — Patient Instructions (Signed)
-   Resume previous diet. - Resume Eliquis  (apixaban ) at prior dose today. - Repeat colonoscopy in 10 years for screening purposes.  YOU HAD AN ENDOSCOPIC PROCEDURE TODAY AT THE [facility]:   Refer to the procedure report that was given to you for any specific questions about what was found during the examination.  If the procedure report does not answer your questions, please call your gastroenterologist to clarify.  If you requested that your care partner not be given the details of your procedure findings, then the procedure report has been included in a sealed envelope for you to review at your convenience later.  YOU SHOULD EXPECT: Some feelings of bloating in the abdomen. Passage of more gas than usual.  Walking can help get rid of the air that was put into your GI tract during the procedure and reduce the bloating. If you had a lower endoscopy (such as a colonoscopy or flexible sigmoidoscopy) you may notice spotting of blood in your stool or on the toilet paper. If you underwent a bowel prep for your procedure, you may not have a normal bowel movement for a few days.  Please Note:  You might notice some irritation and congestion in your nose or some drainage.  This is from the oxygen  used during your procedure.  There is no need for concern and it should clear up in a day or so.  SYMPTOMS TO REPORT IMMEDIATELY:  Following lower endoscopy (colonoscopy or flexible sigmoidoscopy):  Excessive amounts of blood in the stool  Significant tenderness or worsening of abdominal pains  Swelling of the abdomen that is new, acute  Fever of 100F or higher  For urgent or emergent issues, a gastroenterologist can be reached at any hour by calling [phone]. Do not use MyChart messaging for urgent concerns.    DIET:  We do recommend a small meal at first, but then you may proceed to your regular diet.  Drink plenty of fluids but you should avoid alcoholic beverages for 24  hours.  ACTIVITY:  You should plan to take it easy for the rest of today and you should NOT DRIVE or use heavy machinery until tomorrow (because of the sedation medicines used during the test).    FOLLOW UP: Our staff will call the number listed on your records the next business day following your procedure.  We will call around 7:15- 8:00 am to check on you and address any questions or concerns that you may have regarding the information given to you following your procedure. If we do not reach you, we will leave a message.     If any biopsies were taken you will be contacted by phone or by letter within the next 1-3 weeks.  Please call us  at [phone] if you have not heard about the biopsies in 3 weeks.    SIGNATURES/CONFIDENTIALITY: You and/or your care partner have signed paperwork which will be entered into your electronic medical record.  These signatures attest to the fact that that the information above on your After Visit Summary has been reviewed and is understood.  Full responsibility of the confidentiality of this discharge information lies with you and/or your care-partner.

## 2024-06-22 NOTE — Op Note (Signed)
  [facility] Patient Name: Glenn [name] Procedure Date: 06/22/2024 9:52 AM MRN: [id number] Endoscopist: Victory L. Legrand , MD, [id number] Age: 58 Referring MD:  Date of Birth: 1965/10/14 Gender: Male Account #: [id number] Procedure:                Colonoscopy Indications:              Surveillance: Personal history of adenomatous                            polyps on last colonoscopy > 5 years ago                           TA on 2017 colonoscopy at [facility] (small adenoma,                            according to [place] clinic notes) Medicines:                Monitored Anesthesia Care Procedure:                Pre-Anesthesia Assessment:                           - Prior to the procedure, a History and Physical                            was performed, and patient medications and                            allergies were reviewed. The patient's tolerance of                            previous anesthesia was also reviewed. The risks                            and benefits of the procedure and the sedation                            options and risks were discussed with the patient.                            All questions were answered, and informed consent                            was obtained. Prior Anticoagulants: The patient has                            taken Eliquis  (apixaban ), last dose was 2 days                            prior to procedure. ASA Grade Assessment: III - A                            patient with severe systemic disease. After  reviewing the risks and benefits, the patient was                            deemed in satisfactory condition to undergo the                            procedure.                           After obtaining informed consent, the colonoscope                            was passed under direct vision. Throughout the                            procedure, the patient's blood pressure, pulse, and                             oxygen  saturations were monitored continuously. The                            Olympus Scope SN: [id number] was introduced through                            the anus and advanced to the the cecum, identified                            by appendiceal orifice and ileocecal valve. The                            colonoscopy was performed without difficulty. The                            patient tolerated the procedure well. The quality                            of the bowel preparation was good. The ileocecal                            valve, appendiceal orifice, and rectum were                            photographed (though ICV photo failed to capture).                            The bowel preparation used was GoLYTELY . Scope In: 10:04:28 AM Scope Out: 10:17:09 AM Scope Withdrawal Time: 0 hours 9 minutes 35 seconds  Total Procedure Duration: 0 hours 12 minutes 41 seconds  Findings:                 The perianal and digital rectal examinations were                            normal.  Repeat examination of right colon under NBI                            performed.                           Internal hemorrhoids were found.                           The exam was otherwise without abnormality on                            direct and retroflexion views. Complications:            No immediate complications. Estimated Blood Loss:     Estimated blood loss: none. Impression:               - Internal hemorrhoids.                           - The examination was otherwise normal on direct                            and retroflexion views.                           - No specimens collected. Recommendation:           - Patient has a contact number available for                            emergencies. The signs and symptoms of potential                            delayed complications were discussed with the                            patient. Return to normal activities  tomorrow.                            Written discharge instructions were provided to the                            patient.                           - Resume previous diet.                           - Resume Eliquis  (apixaban ) at prior dose today.                           - Repeat colonoscopy in 10 years for screening                            purposes. Andrzej Scully L. Legrand, MD 06/22/2024 10:22:20 AM This report has been signed electronically.

## 2024-06-22 NOTE — Progress Notes (Signed)
 Report to PACU, RN, vss, BBS= Clear.

## 2024-06-23 ENCOUNTER — Telehealth: Payer: Self-pay

## 2024-06-23 NOTE — Telephone Encounter (Signed)
  Follow up Call-     06/22/2024    8:57 AM 05/04/2024    1:26 PM  Call back number  Post procedure Call Back phone  # [phone]  Permission to leave phone message Yes Yes     Patient questions:  Do you have a fever, pain , or abdominal swelling? No. Pain Score  0 *  Have you tolerated food without any problems? Yes.    Have you been able to return to your normal activities? Yes.    Do you have any questions about your discharge instructions: Diet   No. Medications  No. Follow up visit  No.  Do you have questions or concerns about your Care? No.  Actions: * If pain score is 4 or above: No action needed, pain <4.
